# Patient Record
Sex: Female | Born: 1970 | Race: White | Hispanic: No | Marital: Single | Smoking: Never smoker
Health system: Southern US, Community
[De-identification: ages and names within clinical notes are randomized; demographics above are authoritative.]

## PROBLEM LIST (undated history)

## (undated) DIAGNOSIS — E559 Vitamin D deficiency, unspecified: Secondary | ICD-10-CM

## (undated) DIAGNOSIS — D649 Anemia, unspecified: Secondary | ICD-10-CM

## (undated) DIAGNOSIS — I8290 Acute embolism and thrombosis of unspecified vein: Secondary | ICD-10-CM

## (undated) DIAGNOSIS — K651 Peritoneal abscess: Secondary | ICD-10-CM

## (undated) DIAGNOSIS — Z5189 Encounter for other specified aftercare: Secondary | ICD-10-CM

## (undated) HISTORY — PX: WISDOM TOOTH EXTRACTION: SHX21

## (undated) HISTORY — DX: Peritoneal abscess: K65.1

## (undated) HISTORY — DX: Anemia, unspecified: D64.9

---

## 1998-06-06 ENCOUNTER — Other Ambulatory Visit: Admission: RE | Admit: 1998-06-06 | Discharge: 1998-06-06 | Payer: Self-pay | Admitting: *Deleted

## 1998-07-03 ENCOUNTER — Encounter: Admission: RE | Admit: 1998-07-03 | Discharge: 1998-07-03 | Payer: Self-pay | Admitting: Family Medicine

## 1998-07-11 ENCOUNTER — Encounter: Admission: RE | Admit: 1998-07-11 | Discharge: 1998-07-11 | Payer: Self-pay | Admitting: Family Medicine

## 1999-01-01 ENCOUNTER — Inpatient Hospital Stay (HOSPITAL_COMMUNITY): Admission: AD | Admit: 1999-01-01 | Discharge: 1999-01-03 | Payer: Self-pay | Admitting: *Deleted

## 1999-02-23 ENCOUNTER — Other Ambulatory Visit: Admission: RE | Admit: 1999-02-23 | Discharge: 1999-02-23 | Payer: Self-pay | Admitting: *Deleted

## 1999-05-08 ENCOUNTER — Encounter: Admission: RE | Admit: 1999-05-08 | Discharge: 1999-05-08 | Payer: Self-pay | Admitting: Family Medicine

## 1999-05-29 ENCOUNTER — Encounter: Admission: RE | Admit: 1999-05-29 | Discharge: 1999-05-29 | Payer: Self-pay | Admitting: Family Medicine

## 1999-06-23 ENCOUNTER — Encounter: Admission: RE | Admit: 1999-06-23 | Discharge: 1999-06-23 | Payer: Self-pay | Admitting: Family Medicine

## 2000-03-29 ENCOUNTER — Emergency Department (HOSPITAL_COMMUNITY): Admission: EM | Admit: 2000-03-29 | Discharge: 2000-03-29 | Payer: Self-pay

## 2000-04-26 ENCOUNTER — Emergency Department (HOSPITAL_COMMUNITY): Admission: EM | Admit: 2000-04-26 | Discharge: 2000-04-26 | Payer: Self-pay | Admitting: Emergency Medicine

## 2000-04-26 ENCOUNTER — Encounter: Payer: Self-pay | Admitting: Emergency Medicine

## 2000-12-16 ENCOUNTER — Other Ambulatory Visit: Admission: RE | Admit: 2000-12-16 | Discharge: 2000-12-16 | Payer: Self-pay | Admitting: *Deleted

## 2001-12-29 ENCOUNTER — Other Ambulatory Visit: Admission: RE | Admit: 2001-12-29 | Discharge: 2001-12-29 | Payer: Self-pay | Admitting: *Deleted

## 2002-11-20 ENCOUNTER — Other Ambulatory Visit: Admission: RE | Admit: 2002-11-20 | Discharge: 2002-11-20 | Payer: Self-pay | Admitting: *Deleted

## 2003-03-04 ENCOUNTER — Inpatient Hospital Stay (HOSPITAL_COMMUNITY): Admission: AD | Admit: 2003-03-04 | Discharge: 2003-03-04 | Payer: Self-pay | Admitting: *Deleted

## 2003-05-27 ENCOUNTER — Encounter: Payer: Self-pay | Admitting: Obstetrics and Gynecology

## 2003-05-27 ENCOUNTER — Inpatient Hospital Stay (HOSPITAL_COMMUNITY): Admission: AD | Admit: 2003-05-27 | Discharge: 2003-05-27 | Payer: Self-pay | Admitting: Obstetrics and Gynecology

## 2003-06-04 ENCOUNTER — Inpatient Hospital Stay (HOSPITAL_COMMUNITY): Admission: AD | Admit: 2003-06-04 | Discharge: 2003-06-07 | Payer: Self-pay | Admitting: Obstetrics and Gynecology

## 2003-07-12 ENCOUNTER — Other Ambulatory Visit: Admission: RE | Admit: 2003-07-12 | Discharge: 2003-07-12 | Payer: Self-pay | Admitting: *Deleted

## 2004-01-09 ENCOUNTER — Other Ambulatory Visit: Admission: RE | Admit: 2004-01-09 | Discharge: 2004-01-09 | Payer: Self-pay | Admitting: *Deleted

## 2004-12-31 ENCOUNTER — Other Ambulatory Visit: Admission: RE | Admit: 2004-12-31 | Discharge: 2004-12-31 | Payer: Self-pay | Admitting: Obstetrics and Gynecology

## 2006-01-14 ENCOUNTER — Other Ambulatory Visit: Admission: RE | Admit: 2006-01-14 | Discharge: 2006-01-14 | Payer: Self-pay | Admitting: Obstetrics and Gynecology

## 2007-03-02 ENCOUNTER — Encounter: Payer: Self-pay | Admitting: Vascular Surgery

## 2007-03-02 ENCOUNTER — Ambulatory Visit (HOSPITAL_COMMUNITY): Admission: RE | Admit: 2007-03-02 | Discharge: 2007-03-02 | Payer: Self-pay | Admitting: Orthopaedic Surgery

## 2007-03-02 ENCOUNTER — Ambulatory Visit: Payer: Self-pay | Admitting: Vascular Surgery

## 2007-06-29 ENCOUNTER — Encounter: Admission: RE | Admit: 2007-06-29 | Discharge: 2007-06-29 | Payer: Self-pay | Admitting: Obstetrics and Gynecology

## 2007-10-11 ENCOUNTER — Encounter: Admission: RE | Admit: 2007-10-11 | Discharge: 2007-10-11 | Payer: Self-pay | Admitting: Internal Medicine

## 2010-12-17 ENCOUNTER — Other Ambulatory Visit: Payer: Self-pay | Admitting: Obstetrics and Gynecology

## 2010-12-17 DIAGNOSIS — N6310 Unspecified lump in the right breast, unspecified quadrant: Secondary | ICD-10-CM

## 2010-12-22 ENCOUNTER — Ambulatory Visit
Admission: RE | Admit: 2010-12-22 | Discharge: 2010-12-22 | Disposition: A | Discharge status: Home / Self Care | Payer: BC Managed Care – PPO | Source: Ambulatory Visit | Attending: Obstetrics and Gynecology | Admitting: Obstetrics and Gynecology

## 2010-12-22 DIAGNOSIS — N6310 Unspecified lump in the right breast, unspecified quadrant: Secondary | ICD-10-CM

## 2011-03-19 NOTE — H&P (Signed)
   NAMELENAE, WHERLEY                      ACCOUNT NO.:  [id number]   MEDICAL RECORD NO.:  [id number]                   PATIENT TYPE:  INP   LOCATION:  9198                                 FACILITY:  [facility]   PHYSICIAN:  Tracie Harrier, M.D.              DATE OF BIRTH:  03/25/1971   DATE OF ADMISSION:  05/31/2003  DATE OF DISCHARGE:                                HISTORY & PHYSICAL   HISTORY OF PRESENT ILLNESS:  Ms. Kimberly Stuart is a 40 year old female gravida 4  para 2 A1 at 57 and two-sevenths weeks gestation.  The patient is admitted  for primary cesarean section due to breech presentation.  Her pregnancy has  been uncomplicated except for a somewhat unstable presentation late in  pregnancy.  She has had two previous vaginal deliveries.   Otherwise, her pregnancy has gone pretty well.   OBSTETRICAL LABORATORY DATA:  Maternal blood type is A positive.  Rubella  immune.  Group B strep negative.  Glucola 106.   MEDICAL HISTORY:  History of cervical dysplasia.   SURGICAL HISTORY:  1. Cryotherapy of cervix in 1991.  2. Left hand surgery in 1997.  3. In 1988 D&C.   OBSTETRICAL HISTORY:  1. In 1988 SAB with D&E.  2. In 1994, normal spontaneous vaginal delivery at term.  3. In 2000, normal spontaneous vaginal delivery at term.   CURRENT MEDICATIONS:  Prenatal vitamins.   ALLERGIES:  No known drug allergies.   PHYSICAL EXAMINATION:  VITAL SIGNS:  Stable, blood pressure 110/70, fetal  heart tones 140s.  GENERAL:  She is a well-developed, well-nourished female in no acute  distress.  HEENT:  Within normal limits.  NECK:  Supple without adenopathy or thyromegaly.  HEART:  Regular rate and rhythm without murmur, gallop, or rub.  LUNGS:  Clear to auscultation.  BREAST:  Deferred.  ABDOMEN:  Gravid and nontender.  EXTREMITIES AND NEUROLOGIC:  Grossly normal.  PELVIC:  Deferred.   Ultrasound reveals breech presentation.   ADMITTING DIAGNOSES:  1. Intrauterine pregnancy at  term.  2. Breech presentation.   PLAN:  Primary cesarean section.   DISCUSSION:  The risks and benefits of this surgery explained to the  patient.                                               Tracie Harrier, M.D.    REG/MEDQ  D:  05/31/2003  T:  05/31/2003  Job:  [id number]

## 2016-07-31 ENCOUNTER — Encounter (HOSPITAL_COMMUNITY): Payer: Self-pay | Admitting: Emergency Medicine

## 2016-07-31 ENCOUNTER — Emergency Department (HOSPITAL_COMMUNITY): Payer: BLUE CROSS/BLUE SHIELD

## 2016-07-31 ENCOUNTER — Emergency Department (HOSPITAL_COMMUNITY)
Admission: EM | Admit: 2016-07-31 | Discharge: 2016-08-01 | Disposition: A | Discharge status: Home / Self Care | Payer: BLUE CROSS/BLUE SHIELD | Attending: Emergency Medicine | Admitting: Emergency Medicine

## 2016-07-31 DIAGNOSIS — R1030 Lower abdominal pain, unspecified: Secondary | ICD-10-CM | POA: Diagnosis present

## 2016-07-31 DIAGNOSIS — N946 Dysmenorrhea, unspecified: Secondary | ICD-10-CM | POA: Diagnosis not present

## 2016-07-31 LAB — COMPREHENSIVE METABOLIC PANEL
ALK PHOS: 52 U/L (ref 38–126)
ALT: 17 U/L (ref 14–54)
AST: 23 U/L (ref 15–41)
Albumin: 4.2 g/dL (ref 3.5–5.0)
Anion gap: 10 (ref 5–15)
BILIRUBIN TOTAL: 0.9 mg/dL (ref 0.3–1.2)
BUN: 12 mg/dL (ref 6–20)
CALCIUM: 9.5 mg/dL (ref 8.9–10.3)
CHLORIDE: 106 mmol/L (ref 101–111)
CO2: 21 mmol/L — ABNORMAL LOW (ref 22–32)
CREATININE: 1.1 mg/dL — AB (ref 0.44–1.00)
GFR calc Af Amer: >60 mL/min (ref >60)
Glucose, Bld: 119 mg/dL — ABNORMAL HIGH (ref 65–99)
Potassium: 3.4 mmol/L — ABNORMAL LOW (ref 3.5–5.1)
Sodium: 137 mmol/L (ref 135–145)
Total Protein: 7.1 g/dL (ref 6.5–8.1)

## 2016-07-31 LAB — URINE MICROSCOPIC-ADD ON
Bacteria, UA: NONE SEEN
WBC, UA: NONE SEEN WBC/hpf (ref 0–5)

## 2016-07-31 LAB — WET PREP, GENITAL
Clue Cells Wet Prep HPF POC: NONE SEEN
SPERM: NONE SEEN
TRICH WET PREP: NONE SEEN
YEAST WET PREP: NONE SEEN

## 2016-07-31 LAB — CBC
HCT: 42.2 % (ref 36.0–46.0)
Hemoglobin: 14.3 g/dL (ref 12.0–15.0)
MCH: 31.2 pg (ref 26.0–34.0)
MCHC: 33.9 g/dL (ref 30.0–36.0)
MCV: 92.1 fL (ref 78.0–100.0)
PLATELETS: 240 10*3/uL (ref 150–400)
RBC: 4.58 MIL/uL (ref 3.87–5.11)
RDW: 12.4 % (ref 11.5–15.5)
WBC: 13.1 10*3/uL — AB (ref 4.0–10.5)

## 2016-07-31 LAB — URINALYSIS, ROUTINE W REFLEX MICROSCOPIC
BILIRUBIN URINE: NEGATIVE
Glucose, UA: NEGATIVE mg/dL
KETONES UR: NEGATIVE mg/dL
Leukocytes, UA: NEGATIVE
Nitrite: NEGATIVE
Protein, ur: NEGATIVE mg/dL
SPECIFIC GRAVITY, URINE: 1.036 — AB (ref 1.005–1.030)
pH: 7 (ref 5.0–8.0)

## 2016-07-31 LAB — I-STAT BETA HCG BLOOD, ED (MC, WL, AP ONLY): I-stat hCG, quantitative: <5 m[IU]/mL (ref <5)

## 2016-07-31 LAB — LIPASE, BLOOD: LIPASE: 33 U/L (ref 11–51)

## 2016-07-31 MED ORDER — ONDANSETRON 4 MG PO TBDP
4.0000 mg | ORAL_TABLET | Freq: Once | ORAL | Status: AC | PRN
  Administered 2016-07-31: 4 mg via ORAL

## 2016-07-31 MED ORDER — IBUPROFEN 400 MG PO TABS
800.0000 mg | ORAL_TABLET | Freq: Once | ORAL | Status: AC | PRN
  Administered 2016-07-31: 800 mg via ORAL

## 2016-07-31 MED ORDER — IOPAMIDOL (ISOVUE-300) INJECTION 61%
INTRAVENOUS | Status: AC
  Administered 2016-07-31: 100 mL
  Filled 2016-07-31: qty 100

## 2016-07-31 MED ORDER — IBUPROFEN 400 MG PO TABS
ORAL_TABLET | ORAL | Status: AC
  Filled 2016-07-31: qty 2

## 2016-07-31 MED ORDER — SODIUM CHLORIDE 0.9 % IV BOLUS (SEPSIS)
1000.0000 mL | Freq: Once | INTRAVENOUS | Status: AC
  Administered 2016-07-31: 1000 mL via INTRAVENOUS

## 2016-07-31 MED ORDER — ONDANSETRON 4 MG PO TBDP
ORAL_TABLET | ORAL | Status: AC
  Filled 2016-07-31: qty 1

## 2016-07-31 NOTE — ED Provider Notes (Signed)
[facility]-EMERGENCY DEPT Provider Note   CSN: [id number] Arrival date & time: 07/31/16  1738     History   Chief Complaint Chief Complaint  Patient presents with  . Abdominal Pain    HPI Kimberly Stuart is a 45 y.o. female.  1 day of increased severe menstrual cramping.  She states she started her menstrual cycle yesterday with normal cramping.  Today woke up with bilateral lower abdominal pain and severe cramping with less bleeding than normal.  She states she was worked up for abnormal menstrual bleeding several months ago with normal findings.  Patient states she normally can take ibuprofen with relief.  She tried this yesterday and today with no relief.  Denies any vaginal discharge possibility of pregnancy, dysuria or frequency.  She does state she has a history of constipation, but normally has a loose stool with the onset of her menses, which happened yesterday.  Denies any fever, recent antibiotic use      History reviewed. No pertinent past medical history.  There are no active problems to display for this patient.   History reviewed. No pertinent surgical history.  OB History    No data available       Home Medications    Prior to Admission medications   Medication Sig Start Date End Date Taking? Authorizing Provider  ibuprofen (ADVIL,MOTRIN) 200 MG tablet Take 200-600 mg by mouth every 6 (six) hours as needed for moderate pain.   Yes Historical Provider, MD    Family History History reviewed. No pertinent family history.  Social History Social History  Substance Use Topics  . Smoking status: Never Smoker  . Smokeless tobacco: Never Used  . Alcohol use No     Allergies   Review of patient's allergies indicates no known allergies.   Review of Systems Review of Systems  Constitutional: Negative for chills and fever.  Respiratory: Negative for shortness of breath.   Cardiovascular: Negative for chest pain.  Gastrointestinal: Positive for abdominal  pain.  Genitourinary: Positive for menstrual problem and vaginal bleeding. Negative for dysuria, frequency and vaginal discharge.  All other systems reviewed and are negative.    Physical Exam Updated Vital Signs BP 112/74   Pulse 69   Temp 98.4 F (36.9 C) (Oral)   Resp 16   LMP 07/30/2016 (Exact Date)   SpO2 99%   Physical Exam  Constitutional: She is oriented to person, place, and time. She appears well-developed and well-nourished.  HENT:  Head: Normocephalic.  Eyes: Pupils are equal, round, and reactive to light.  Neck: Normal range of motion.  Cardiovascular: Normal rate.   Pulmonary/Chest: Effort normal.  Abdominal: Soft.  Neurological: She is alert and oriented to person, place, and time.  Psychiatric: She has a normal mood and affect.  Nursing note and vitals reviewed.    ED Treatments / Results  Labs (all labs ordered are listed, but only abnormal results are displayed) Labs Reviewed  WET PREP, GENITAL - Abnormal; Notable for the following:       Result Value   WBC, Wet Prep HPF POC MANY (*)    All other components within normal limits  COMPREHENSIVE METABOLIC PANEL - Abnormal; Notable for the following:    Potassium 3.4 (*)    CO2 21 (*)    Glucose, Bld 119 (*)    Creatinine, Ser 1.10 (*)    All other components within normal limits  URINALYSIS, ROUTINE W REFLEX MICROSCOPIC (NOT AT [facility]) - Abnormal; Notable for the following:  Specific Gravity, Urine 1.036 (*)    Hgb urine dipstick LARGE (*)    All other components within normal limits  CBC - Abnormal; Notable for the following:    WBC 13.1 (*)    All other components within normal limits  URINE MICROSCOPIC-ADD ON - Abnormal; Notable for the following:    Squamous Epithelial / LPF 6-30 (*)    All other components within normal limits  LIPASE, BLOOD  I-STAT BETA HCG BLOOD, ED ([facility], [facility], [facility] ONLY)    EKG  EKG Interpretation None       Radiology Ct Abdomen Pelvis W Contrast  Result Date:  08/01/2016 CLINICAL DATA:  Abdominal pain. EXAM: CT ABDOMEN AND PELVIS WITH CONTRAST TECHNIQUE: Multidetector CT imaging of the abdomen and pelvis was performed using the standard protocol following bolus administration of intravenous contrast. CONTRAST:  100 mL Isovue-300 COMPARISON:  None. FINDINGS: Lower chest: Mild dependent changes in the lung bases. Hepatobiliary: 14 mm low-attenuation lesion in the dome of the liver likely representing a small cyst. No other focal liver lesions identified. Gallbladder and bile ducts are unremarkable. Pancreas: Unremarkable. No pancreatic ductal dilatation or surrounding inflammatory changes. Spleen: Normal in size without focal abnormality. Adrenals/Urinary Tract: No adrenal gland nodules. Renal nephrograms are symmetrical. No hydronephrosis or hydroureter. Bladder wall is not thickened. Stomach/Bowel: Stomach, small bowel, and colon are mostly decompressed. Scattered stool in the colon. Scattered colonic diverticula. No inflammatory changes. Vascular/Lymphatic: Normal caliber thoracic aorta. Scattered lymph nodes in the retroperitoneum are not pathologically enlarged. Prominent lymph nodes demonstrated in the pelvis, greater on the left but without pathologic enlargement, likely reactive. Reproductive: Uterus is anteverted. Endometrial stripe thickness in the fundus appears normal. Endometrial stripe in the lower uterine segment and endocervical canal appears increased. This may be physiologic. No abnormal adnexal masses. Other: No abdominal wall hernia or abnormality. No abdominopelvic ascites. Musculoskeletal: No acute or significant osseous findings. IMPRESSION: No evidence of bowel obstruction or inflammation. Probable cyst in the dome of the liver. Expanded endometrium in the lower uterine segment and endocervical canal, possibly physiologic. Correlation with menstrual stage is recommended. No abnormal adnexal masses. Electronically Signed   By: Burman Nieves M.D.    On: 08/01/2016 00:19   Dg Abd Acute W/chest  Result Date: 07/31/2016 CLINICAL DATA:  Acute onset of lower abdominal pain. Nausea and dizziness. Initial encounter. EXAM: DG ABDOMEN ACUTE W/ 1V CHEST COMPARISON:  None. FINDINGS: The lungs are well-aerated and clear. There is no evidence of focal opacification, pleural effusion or pneumothorax. The cardiomediastinal silhouette is within normal limits. The visualized bowel gas pattern is unremarkable. Scattered stool and air are seen within the colon; there is no evidence of small bowel dilatation to suggest obstruction. No free intra-abdominal air is identified on the provided upright view. No acute osseous abnormalities are seen; the sacroiliac joints are unremarkable in appearance. IMPRESSION: 1. Unremarkable bowel gas pattern; no free intra-abdominal air seen. Small amount of stool noted in the colon. 2. No acute cardiopulmonary process seen. Electronically Signed   By: Roanna Raider M.D.   On: 07/31/2016 22:32    Procedures Procedures (including critical care time)  Medications Ordered in ED Medications  iopamidol (ISOVUE-300) 61 % injection (not administered)  ondansetron (ZOFRAN-ODT) disintegrating tablet 4 mg (4 mg Oral Given 07/31/16 1755)  ibuprofen (ADVIL,MOTRIN) tablet 800 mg (800 mg Oral Given 07/31/16 1803)  sodium chloride 0.9 % bolus 1,000 mL (0 mLs Intravenous Stopped 07/31/16 2219)     Initial Impression / Assessment and  Plan / ED Course  I have reviewed the triage vital signs and the nursing notes.  Pertinent labs & imaging results that were available during my care of the patient were reviewed by me and considered in my medical decision making (see chart for details).  Clinical Course     CT scan pelvic exam, labs, wet prep within normal parameters except for slightly elevated white count of 13.1, without any overt indication of infection.  Patient is currently on day 2 of her menstrual cycle.  She has refused narcotic pain  medication, stating that she doesn't want to become nauseated from the medicine.  She would rather deal with the discomfort and take nonsteroidals on a regular basis.  She does have an OB/GYN that she can follow-up with  Final Clinical Impressions(s) / ED Diagnoses   Final diagnoses:  Dysmenorrhea    New Prescriptions New Prescriptions   No medications on file     Earley FavorGail Ima Hafner, NP 08/01/16 0030    Canary Brimhristopher J Tegeler, MD 08/01/16 [id number]

## 2016-07-31 NOTE — ED Notes (Signed)
Attempted IV, unsuccessful. Second RN will try.

## 2016-07-31 NOTE — ED Triage Notes (Signed)
Menses onset yesterday; having cramps today worse than when she was in labor with children. Bleeding is not heavier than normal, but the cramps are nonstop. Taking Motrin all day without relief.

## 2016-07-31 NOTE — ED Notes (Signed)
Refuses Percocet in triage protocol orders. Doubled over in pain. Last Motrin was at 12:00. So far today has taken a total of 5 (200mg ) tablets in last 24 hours. Does want more motrin at this time.

## 2016-08-01 NOTE — Discharge Instructions (Signed)
You were evaluated for abdominal pain, your CT scan is normal.  Urine is normal.  Pelvic exam was within normal parameters not indicating any infection.  Please continue take ibuprofen on a regular basis.  Follow-up with your OB/GYN

## 2016-08-01 NOTE — ED Notes (Signed)
Patient Alert and oriented X4. Stable and ambulatory. Patient verbalized understanding of the discharge instructions.  Patient belongings were taken by the patient.  

## 2017-11-01 DIAGNOSIS — Z5189 Encounter for other specified aftercare: Secondary | ICD-10-CM

## 2017-11-01 DIAGNOSIS — I8290 Acute embolism and thrombosis of unspecified vein: Secondary | ICD-10-CM

## 2017-11-01 DIAGNOSIS — I82409 Acute embolism and thrombosis of unspecified deep veins of unspecified lower extremity: Secondary | ICD-10-CM

## 2017-11-01 HISTORY — DX: Encounter for other specified aftercare: Z51.89

## 2017-11-01 HISTORY — DX: Acute embolism and thrombosis of unspecified deep veins of unspecified lower extremity: I82.409

## 2017-11-01 HISTORY — DX: Acute embolism and thrombosis of unspecified vein: I82.90

## 2018-05-29 ENCOUNTER — Inpatient Hospital Stay (HOSPITAL_COMMUNITY)
Admission: AD | Admit: 2018-05-29 | Discharge: 2018-05-29 | Disposition: A | Discharge status: Home / Self Care | Payer: Self-pay | Source: Ambulatory Visit | Attending: Obstetrics and Gynecology | Admitting: Obstetrics and Gynecology

## 2018-05-29 ENCOUNTER — Encounter (HOSPITAL_COMMUNITY): Payer: Self-pay

## 2018-05-29 ENCOUNTER — Other Ambulatory Visit: Payer: Self-pay

## 2018-05-29 DIAGNOSIS — N939 Abnormal uterine and vaginal bleeding, unspecified: Secondary | ICD-10-CM | POA: Insufficient documentation

## 2018-05-29 DIAGNOSIS — E559 Vitamin D deficiency, unspecified: Secondary | ICD-10-CM | POA: Insufficient documentation

## 2018-05-29 DIAGNOSIS — Z79899 Other long term (current) drug therapy: Secondary | ICD-10-CM | POA: Insufficient documentation

## 2018-05-29 DIAGNOSIS — N921 Excessive and frequent menstruation with irregular cycle: Secondary | ICD-10-CM

## 2018-05-29 HISTORY — DX: Anemia, unspecified: D64.9

## 2018-05-29 HISTORY — DX: Vitamin D deficiency, unspecified: E55.9

## 2018-05-29 LAB — POCT PREGNANCY, URINE: Preg Test, Ur: NEGATIVE

## 2018-05-29 LAB — URINALYSIS, ROUTINE W REFLEX MICROSCOPIC

## 2018-05-29 LAB — URINALYSIS, MICROSCOPIC (REFLEX)

## 2018-05-29 LAB — CBC
HEMATOCRIT: 30.6 % — AB (ref 36.0–46.0)
HEMOGLOBIN: 10.4 g/dL — AB (ref 12.0–15.0)
MCH: 28.8 pg (ref 26.0–34.0)
MCHC: 34 g/dL (ref 30.0–36.0)
MCV: 84.8 fL (ref 78.0–100.0)
Platelets: 244 10*3/uL (ref 150–400)
RBC: 3.61 MIL/uL — ABNORMAL LOW (ref 3.87–5.11)
RDW: 13.7 % (ref 11.5–15.5)
WBC: 6.9 10*3/uL (ref 4.0–10.5)

## 2018-05-29 MED ORDER — MEGESTROL ACETATE 20 MG PO TABS: 60.0000 mg | ORAL_TABLET | Freq: Two times a day (BID) | ORAL | 0 refills | Status: DC

## 2018-05-29 MED ORDER — THERA VITAL M PO TABS: 1.0000 | ORAL_TABLET | Freq: Every day | ORAL | Status: DC

## 2018-05-29 MED ORDER — NORGESTIMATE-ETH ESTRADIOL 0.25-35 MG-MCG PO TABS: 1.0000 | ORAL_TABLET | Freq: Every day | ORAL | 11 refills | Status: DC

## 2018-05-29 NOTE — Discharge Instructions (Signed)

## 2018-05-29 NOTE — MAU Provider Note (Signed)
History     CSN: [id number]  Arrival date and time: 05/29/18 1644   First Provider Initiated Contact with Patient 05/29/18 1810      Chief Complaint  Patient presents with  . Vaginal Bleeding   HPI  Kimberly Stuart is a 47 y.o. [phone] non pregnant patient who presents to MAU with chief complaint of light to moderate vaginal bleeding between periods. Patient states for the past year she has had "some sort of period" every two weeks but has been bleeding continuously for the past four weeks. Denies pain, fever, falls, headaches and lightheadedness.  Not sexually active.  Pertinent Gynecological History: Menses: regular every 14 days without intermenstrual spotting Bleeding: intermenstrual bleeding Contraception: abstinence DES exposure: denies Blood transfusions: none Sexually transmitted diseases: no past history Previous GYN Procedures: None  Last mammogram: normal Date: 2017 per patient Last pap: normal Date: 2017 per patient   Past Medical History:  Diagnosis Date  . Anemia   . Vitamin D deficiency     Past Surgical History:  Procedure Laterality Date  . WISDOM TOOTH EXTRACTION      No family history on file.  Social History   Tobacco Use  . Smoking status: Never Smoker  . Smokeless tobacco: Never Used  Substance Use Topics  . Alcohol use: No  . Drug use: No    Allergies: No Known Allergies  Medications Prior to Admission  Medication Sig Dispense Refill Last Dose  . ibuprofen (ADVIL,MOTRIN) 200 MG tablet Take 200-600 mg by mouth every 6 (six) hours as needed for moderate pain.   07/31/2016 at Unknown time    Review of Systems  Constitutional: Negative for fatigue and fever.  Gastrointestinal: Negative for abdominal distention, abdominal pain, nausea and vomiting.  Genitourinary: Positive for vaginal bleeding. Negative for vaginal pain.  Neurological: Negative for dizziness, light-headedness and headaches.  All other systems reviewed and are  negative.  Physical Exam   Blood pressure 119/69, pulse 78, temperature 98 F (36.7 C), temperature source Oral, resp. rate 17, weight 180 lb 4 oz (81.8 kg), last menstrual period 05/01/2018, SpO2 98 %.  Physical Exam  Nursing note and vitals reviewed. Constitutional: She is oriented to person, place, and time. She appears well-developed and well-nourished.  Cardiovascular: Normal rate, regular rhythm, normal heart sounds and intact distal pulses.  Respiratory: Effort normal and breath sounds normal.  GI: Soft. Bowel sounds are normal.  Genitourinary: Uterus normal. Vaginal discharge found.  Genitourinary Comments: Moderate dark red sanguinous vaginal discharge visible on SSE  Neurological: She is alert and oriented to person, place, and time. She has normal reflexes.  Skin: Skin is warm and dry.  Psychiatric: She has a normal mood and affect. Her behavior is normal. Judgment and thought content normal.    MAU Course  Procedures  MDM Patient Vitals for the past 24 hrs:  BP Temp Temp src Pulse Resp SpO2 Weight  05/29/18 1840 - - - - 16 - -  05/29/18 1709 119/69 98 F (36.7 C) Oral 78 17 98 % 180 lb 4 oz (81.8 kg)    Orders Placed This Encounter  Procedures  . CBC  . Urinalysis, Routine w reflex microscopic  . Urinalysis, Microscopic (reflex)  . Pregnancy, urine POC  . Discharge patient   Results for orders placed or performed during the hospital encounter of 05/29/18 (from the past 24 hour(s))  CBC     Status: Abnormal   Collection Time: 05/29/18  5:18 PM  Result Value Ref Range  WBC 6.9 4.0 - 10.5 K/uL   RBC 3.61 (L) 3.87 - 5.11 MIL/uL   Hemoglobin 10.4 (L) 12.0 - 15.0 g/dL   HCT 09.8 (L) 11.9 - 14.7 %   MCV 84.8 78.0 - 100.0 fL   MCH 28.8 26.0 - 34.0 pg   MCHC 34.0 30.0 - 36.0 g/dL   RDW 82.9 56.2 - 13.0 %   Platelets 244 150 - 400 K/uL  Urinalysis, Routine w reflex microscopic     Status: Abnormal   Collection Time: 05/29/18  6:07 PM  Result Value Ref Range    Color, Urine RED (A) YELLOW   APPearance TURBID (A) CLEAR   Specific Gravity, Urine  1.005 - 1.030    TEST NOT REPORTED DUE TO COLOR INTERFERENCE OF URINE PIGMENT   pH  5.0 - 8.0    TEST NOT REPORTED DUE TO COLOR INTERFERENCE OF URINE PIGMENT   Glucose, UA (A) NEGATIVE mg/dL    TEST NOT REPORTED DUE TO COLOR INTERFERENCE OF URINE PIGMENT   Hgb urine dipstick (A) NEGATIVE    TEST NOT REPORTED DUE TO COLOR INTERFERENCE OF URINE PIGMENT   Bilirubin Urine (A) NEGATIVE    TEST NOT REPORTED DUE TO COLOR INTERFERENCE OF URINE PIGMENT   Ketones, ur (A) NEGATIVE mg/dL    TEST NOT REPORTED DUE TO COLOR INTERFERENCE OF URINE PIGMENT   Protein, ur (A) NEGATIVE mg/dL    TEST NOT REPORTED DUE TO COLOR INTERFERENCE OF URINE PIGMENT   Nitrite (A) NEGATIVE    TEST NOT REPORTED DUE TO COLOR INTERFERENCE OF URINE PIGMENT   Leukocytes, UA (A) NEGATIVE    TEST NOT REPORTED DUE TO COLOR INTERFERENCE OF URINE PIGMENT  Urinalysis, Microscopic (reflex)     Status: Abnormal   Collection Time: 05/29/18  6:07 PM  Result Value Ref Range   RBC / HPF >50 0 - 5 RBC/hpf   WBC, UA 0-5 0 - 5 WBC/hpf   Bacteria, UA FEW (A) NONE SEEN   Squamous Epithelial / LPF 0-5 0 - 5  Pregnancy, urine POC     Status: None   Collection Time: 05/29/18  6:10 PM  Result Value Ref Range   Preg Test, Ur NEGATIVE NEGATIVE   Meds ordered this encounter  Medications  . megestrol (MEGACE) 20 MG tablet    Sig: Take 3 tablets (60 mg total) by mouth 2 (two) times daily.    Dispense:  180 tablet    Refill:  0    Order Specific Question:   Supervising Provider    Answer:   Reva Bores [[id number]]  . Multiple Vitamins-Minerals (MULTIVITAMIN) tablet    Sig: Take 1 tablet by mouth daily.    Order Specific Question:   Supervising Provider    Answer:   Samara Snide  . norgestimate-ethinyl estradiol (ORTHO-CYCLEN,SPRINTEC,PREVIFEM) 0.25-35 MG-MCG tablet    Sig: Take 1 tablet by mouth daily.    Dispense:  1 Package    Refill:  11     Order Specific Question:   Supervising Provider    Answer:   Reva Bores [[id number]]     Assessment and Plan  --47 y.o. non pregnant patient --abnormal uterine bleeding  --declines outpatient ultrasound --Printed rx for Megace and Sprintec given to patient --Patient to establish care with GYN provider --stable for outpatient followup, discharge home in stable condition  Calvert Cantor, CNM 05/29/2018, 7:13 PM

## 2018-05-29 NOTE — MAU Note (Signed)
Has been bleeding for about 4 wks.  Every 30-1040min, is changing a pad and tampon, completely saturated.  No pain at all.  About 2 yrs ago, bled for about 3 wks, saw her GYN- everything was fine.slowed down and stopped. Last few months- periods have been closer.

## 2018-06-11 ENCOUNTER — Encounter (HOSPITAL_COMMUNITY): Payer: Self-pay | Admitting: Advanced Practice Midwife

## 2018-06-11 ENCOUNTER — Other Ambulatory Visit: Payer: Self-pay

## 2018-06-11 ENCOUNTER — Inpatient Hospital Stay (HOSPITAL_COMMUNITY)
Admission: AD | Admit: 2018-06-11 | Discharge: 2018-06-11 | Disposition: A | Discharge status: Home / Self Care | Payer: Self-pay | Source: Ambulatory Visit | Attending: Obstetrics and Gynecology | Admitting: Obstetrics and Gynecology

## 2018-06-11 ENCOUNTER — Inpatient Hospital Stay (HOSPITAL_BASED_OUTPATIENT_CLINIC_OR_DEPARTMENT_OTHER): Payer: Self-pay

## 2018-06-11 DIAGNOSIS — I82442 Acute embolism and thrombosis of left tibial vein: Secondary | ICD-10-CM | POA: Insufficient documentation

## 2018-06-11 DIAGNOSIS — T385X5A Adverse effect of other estrogens and progestogens, initial encounter: Secondary | ICD-10-CM | POA: Insufficient documentation

## 2018-06-11 DIAGNOSIS — X58XXXA Exposure to other specified factors, initial encounter: Secondary | ICD-10-CM | POA: Insufficient documentation

## 2018-06-11 LAB — COMPREHENSIVE METABOLIC PANEL
ALBUMIN: 3.8 g/dL (ref 3.5–5.0)
ALT: 16 U/L (ref 0–44)
AST: 17 U/L (ref 15–41)
Alkaline Phosphatase: 57 U/L (ref 38–126)
Anion gap: 10 (ref 5–15)
BUN: 10 mg/dL (ref 6–20)
CHLORIDE: 101 mmol/L (ref 98–111)
CO2: 22 mmol/L (ref 22–32)
CREATININE: 0.83 mg/dL (ref 0.44–1.00)
Calcium: 8.4 mg/dL — ABNORMAL LOW (ref 8.9–10.3)
GFR calc Af Amer: >60 mL/min (ref >60)
GFR calc non Af Amer: >60 mL/min (ref >60)
GLUCOSE: 121 mg/dL — AB (ref 70–99)
Potassium: 3.8 mmol/L (ref 3.5–5.1)
SODIUM: 133 mmol/L — AB (ref 135–145)
Total Bilirubin: 0.4 mg/dL (ref 0.3–1.2)
Total Protein: 7.2 g/dL (ref 6.5–8.1)

## 2018-06-11 LAB — URINALYSIS, ROUTINE W REFLEX MICROSCOPIC
Bilirubin Urine: NEGATIVE
GLUCOSE, UA: NEGATIVE mg/dL
Ketones, ur: NEGATIVE mg/dL
Leukocytes, UA: NEGATIVE
NITRITE: NEGATIVE
PH: 5 (ref 5.0–8.0)
Protein, ur: NEGATIVE mg/dL
Specific Gravity, Urine: 1.013 (ref 1.005–1.030)

## 2018-06-11 MED ORDER — RIVAROXABAN (XARELTO) VTE STARTER PACK (15 & 20 MG): 1.0000 | ORAL_TABLET | ORAL | 0 refills | Status: DC

## 2018-06-11 MED ORDER — RIVAROXABAN 15 MG PO TABS
15.0000 mg | ORAL_TABLET | Freq: Once | ORAL | Status: AC
  Administered 2018-06-11: 15 mg via ORAL
  Filled 2018-06-11: qty 1

## 2018-06-11 NOTE — Progress Notes (Addendum)
Nonpregnant here dt left calf pain. Measured both calves and same measurement. No redness noted. Tender when pressing on it, walking, sitting, constant pain "stabbing pain" 8/10.  Also here for vb. Been on bc for 3 weeks. Was prescribed megace but have not started on it.   "Usually taking Ibuprofen helps with any pain". Took two tabls of 200mg  each last night at 2300 but did not help.   1345: DVT doppler study ordered.   1350: [facility]   1355: vascular tech returned call. Report status of pt given.   1615 vascular tech at bs.  1625: resumed care of pt. +DVT informed to me by RN Sunny SchleinFelicia and RN Mardella LaymanLindsey from birthing   [phone]: provider okayed for pt to get up and walk to bathroom  Declines pain meds.   1635: up to bathroom and voided without problems  1640: drinks and crackers provided after approval from the provider CNM Ivonne AndrewV. Smith.   1720: lab at bs  1755: provider on phone with pharmacist discussing med administration or to hold  1759: provider ordered to administer.   1804 med given  1845: d/c instructions given with pt understanding.  [facility] given and [facility] Made aware to call internal med for appt.   1857: Pt left unit via ambulatory.

## 2018-06-11 NOTE — MAU Provider Note (Signed)
Chief Complaint: Vaginal Bleeding and Leg Pain   First Provider Initiated Contact with Patient 06/11/18 1354     SUBJECTIVE HPI: Kimberly Stuart is a 47 y.o. [phone] female who presents to Maternity Admissions reporting Left calf pain x 5 days. Started OCPs for AUB 05/29/18. No Hx DVT, other blood clots or know clotting disorder personally or in family members.   Location: Left inner calf Quality: stabbing Severity: Moderate Duration: 5 days Context: Recently started OCPs. No recent Air travel or prolonged sitting.  Timing: constant Modifying factors: No improvement w/ IBU Associated signs and symptoms: Neg for redness, swelling, SOB, hemoptysis,   Past Medical History:  Diagnosis Date  . Anemia   . Vitamin D deficiency    OB History  Gravida Para Term Preterm AB Living  4 3 3   1 3   SAB TAB Ectopic Multiple Live Births  1       3    # Outcome Date GA Lbr Len/2nd Weight Sex Delivery Anes PTL Lv  4 Term      Vag-Spont   LIV  3 Term      Vag-Spont   LIV  2 Term      Vag-Spont   LIV  1 SAB            Past Surgical History:  Procedure Laterality Date  . WISDOM TOOTH EXTRACTION     Social History   Socioeconomic History  . Marital status: Single    Spouse name: Not on file  . Number of children: Not on file  . Years of education: Not on file  . Highest education level: Not on file  Occupational History  . Not on file  Social Needs  . Financial resource strain: Not on file  . Food insecurity:    Worry: Not on file    Inability: Not on file  . Transportation needs:    Medical: Not on file    Non-medical: Not on file  Tobacco Use  . Smoking status: Never Smoker  . Smokeless tobacco: Never Used  Substance and Sexual Activity  . Alcohol use: No  . Drug use: No  . Sexual activity: Not Currently    Birth control/protection: Abstinence  Lifestyle  . Physical activity:    Days per week: Not on file    Minutes per session: Not on file  . Stress: Not on file   Relationships  . Social connections:    Talks on phone: Not on file    Gets together: Not on file    Attends religious service: Not on file    Active member of club or organization: Not on file    Attends meetings of clubs or organizations: Not on file    Relationship status: Not on file  . Intimate partner violence:    Fear of current or ex partner: Not on file    Emotionally abused: Not on file    Physically abused: Not on file    Forced sexual activity: Not on file  Other Topics Concern  . Not on file  Social History Narrative  . Not on file   History reviewed. No pertinent family history. No current facility-administered medications on file prior to encounter.    Current Outpatient Medications on File Prior to Encounter  Medication Sig Dispense Refill  . ibuprofen (ADVIL,MOTRIN) 200 MG tablet Take 200-600 mg by mouth every 6 (six) hours as needed for moderate pain.    . megestrol (MEGACE) 20 MG tablet Take  3 tablets (60 mg total) by mouth 2 (two) times daily. 180 tablet 0  . Multiple Vitamins-Minerals (MULTIVITAMIN) tablet Take 1 tablet by mouth daily.    . norgestimate-ethinyl estradiol (ORTHO-CYCLEN,SPRINTEC,PREVIFEM) 0.25-35 MG-MCG tablet Take 1 tablet by mouth daily. 1 Package 11   No Known Allergies  I have reviewed patient's Past Medical Hx, Surgical Hx, Family Hx, Social Hx, medications and allergies.   Review of Systems  Constitutional: Negative for chills and fever.  Respiratory: Negative for chest tightness and shortness of breath.   Cardiovascular: Negative for chest pain and leg swelling.  Genitourinary: Positive for vaginal bleeding (scant).  Musculoskeletal:       Left calf pain  Skin: Negative for color change, pallor and rash.    OBJECTIVE Patient Vitals for the past 24 hrs:  BP Temp Temp src Pulse Resp SpO2 Height Weight  06/11/18 1804 117/79 - - 78 - - - -  06/11/18 1640 125/69 - - 72 18 - - -  06/11/18 1305 (!) 111/57 98.2 F (36.8 C) Oral 78  18 100 % 5' 1.5" (1.562 m) 80.9 kg   Constitutional: Well-developed, well-nourished female in no acute distress.  Cardiovascular: normal rate Respiratory: normal rate and effort.  GI: Deferred MS: Left inner calf TTP 4 inches above ankle bone. No erythema, warmth, swelling or palpable cords. Slight tightness noted. Pos Homan's. Normal pedal pulses, ROM and skin color. Neurologic: Alert and oriented x 4.  GU: Deferred  LAB RESULTS Results for orders placed or performed during the hospital encounter of 06/11/18 (from the past 24 hour(s))  Urinalysis, Routine w reflex microscopic     Status: Abnormal   Collection Time: 06/11/18  1:18 PM  Result Value Ref Range   Color, Urine YELLOW YELLOW   APPearance CLEAR CLEAR   Specific Gravity, Urine 1.013 1.005 - 1.030   pH 5.0 5.0 - 8.0   Glucose, UA NEGATIVE NEGATIVE mg/dL   Hgb urine dipstick LARGE (A) NEGATIVE   Bilirubin Urine NEGATIVE NEGATIVE   Ketones, ur NEGATIVE NEGATIVE mg/dL   Protein, ur NEGATIVE NEGATIVE mg/dL   Nitrite NEGATIVE NEGATIVE   Leukocytes, UA NEGATIVE NEGATIVE   RBC / HPF 21-50 0 - 5 RBC/hpf   WBC, UA 0-5 0 - 5 WBC/hpf   Bacteria, UA RARE (A) NONE SEEN   Squamous Epithelial / LPF 0-5 0 - 5   Mucus PRESENT   Comprehensive metabolic panel     Status: Abnormal   Collection Time: 06/11/18  5:26 PM  Result Value Ref Range   Sodium 133 (L) 135 - 145 mmol/L   Potassium 3.8 3.5 - 5.1 mmol/L   Chloride 101 98 - 111 mmol/L   CO2 22 22 - 32 mmol/L   Glucose, Bld 121 (H) 70 - 99 mg/dL   BUN 10 6 - 20 mg/dL   Creatinine, Ser 1.610.83 0.44 - 1.00 mg/dL   Calcium 8.4 (L) 8.9 - 10.3 mg/dL   Total Protein 7.2 6.5 - 8.1 g/dL   Albumin 3.8 3.5 - 5.0 g/dL   AST 17 15 - 41 U/L   ALT 16 0 - 44 U/L   Alkaline Phosphatase 57 38 - 126 U/L   Total Bilirubin 0.4 0.3 - 1.2 mg/dL   GFR calc non Af Amer >60 >60 mL/min   GFR calc Af Amer >60 >60 mL/min   Anion gap 10 5 - 15    IMAGING VASCULAR LAB PRELIMINARY  PRELIMINARY  PRELIMINARY   PRELIMINARY  Left lower extremity venous duplex  completed.    Preliminary report:  There is acute DVT noted in the left posterior tibial vein.    KANADY, CANDACE, RVT 06/11/2018, 4:17 PM   MAU COURSE Orders Placed This Encounter  Procedures  . Urinalysis, Routine w reflex microscopic  . Comprehensive metabolic panel  . Discharge patient   Meds ordered this encounter  Medications  . Rivaroxaban (XARELTO) tablet 15 mg  . Rivaroxaban 15 & 20 MG TBPK    Sig: Take 1 tablet by mouth as directed. Take as directed on package: Start with one 15mg  tablet by mouth twice a day with food. On Day 22, switch to one 20mg  tablet once a day with food.    Dispense:  51 each    Refill:  0    Order Specific Question:   Supervising Provider    Answer:   Tilda Burrow [[id number]]   Discussed Hx, labs, exam w/ ED MD. No indication for transfer or IP Tx. New orders: Xarelto starter pack. First dose given in MAU. Also consulted w/ Dr. Emelda Fear who agrees w/ POC.   Discussed pt not having insurance w/ Pharmacy and Dr. Emelda Fear. Will look into coupons for Xarelto. Considered Lovenox, but it is also expensive and requires injections. Considered Coumadin, but CNM is concerned that pt doesn't have F/U arranged and verbalized that she does not like to go to healthcare providers. Instructed pt to call [facility] pharmacy in am to see what their price will be since they have lower proces than most other pharmacies.  Instructed her to call [facility] if she will have difficulty affording Xarelto and that it wil be easier to make an alternate plan  During business hours.   MDM - Left DVT w/out evidence of PE. Nml LLE pulses. Appropriate for OP Tx. PE precautions. Needs to F/U w/ PCP [facility] or [facility]) who may refer her to Heme. Stop OCPs. Do not use estrogens again and use progestins will caution.  - AUB. Stop OCPs. Will need to F/U w/ [facility] for complete eval of AUB and to discuss management  options in light of DVT Dx.    ASSESSMENT 1. Acute deep vein thrombosis (DVT) of tibial vein of left lower extremity (HCC)   2. Adverse effect of combined estrogen and progestogen preparation, initial encounter     PLAN Discharge home in stable condition. PE precautions Follow-up Information    [facility] Follow [facility].   Specialty:  Obstetrics and Gynecology Why:  to discuss management of abnormal uterine bleeding Contact information: [address] [place] [address] [phone]       [facility] Follow up.   Why:  call to schedule follow-up appointment ASAP for deep vein thrombosis.  Contact information: [address] [place] [address] [phone]         Allergies as of 06/11/2018   No Known Allergies     Medication List    STOP taking these medications   ibuprofen 200 MG tablet Commonly known as:  ADVIL,MOTRIN   megestrol 20 MG tablet Commonly known as:  MEGACE   norgestimate-ethinyl estradiol 0.25-35 MG-MCG tablet Commonly known as:  ORTHO-CYCLEN,SPRINTEC,PREVIFEM     TAKE these medications   multivitamin tablet Take 1 tablet by mouth daily.   Rivaroxaban 15 & 20 MG Tbpk Take 1 tablet by mouth as directed. Take as directed on package: Start with one 15mg  tablet by mouth twice a day with food. On  Day 22, switch to one 20mg  tablet once a day with food.        Katrinka Blazing, [place] 06/11/2018  6:21 PM

## 2018-06-11 NOTE — MAU Note (Signed)
Pt presents with c/o VB and pain in left calf.  Reports seen in MAU on July 29 for VB, given BCP's & Megace.  Pt reports calf pain began 5 days ago, no relief with Motrin or Advil.

## 2018-06-11 NOTE — Discharge Instructions (Signed)
Deep Vein Thrombosis Deep vein thrombosis (DVT) is a condition in which a blood clot forms in a deep vein, such as a lower leg, thigh, or arm vein. A clot is blood that has thickened into a gel or solid. This condition is dangerous. It can lead to serious and even life-threatening complications if the clot travels to the lungs and causes a blockage (pulmonary embolism). It can also damage veins in the leg. This can result in leg pain, swelling, discoloration, and sores (post-thrombotic syndrome). What are the causes? This condition may be caused by:  A slowdown of blood flow.  Damage to a vein.  A condition that makes blood clot more easily.  What increases the risk? The following factors may make you more likely to develop this condition:  Being overweight.  Being elderly, especially over age 60.  Sitting or lying down for more than four hours.  Lack of physical activity (sedentary lifestyle).  Being pregnant, giving birth, or having recently given birth.  Taking medicines that contain estrogen.  Smoking.  A history of any of the following: ? Blood clots or blood clotting disease. ? Peripheral vascular disease. ? Inflammatory bowel disease. ? Cancer. ? Heart disease. ? Genetic conditions that affect how blood clots. ? Neurological diseases that affect the legs (leg paresis). ? Injury. ? Major or lengthy surgery. ? A central line placed inside a large vein.  What are the signs or symptoms? Symptoms of this condition include:  Swelling, pain, or tenderness in an arm or leg.  Warmth, redness, or discoloration in an arm or leg.  If the clot is in your leg, symptoms may be more noticeable or worse when you stand or walk. Some people do not have any symptoms. How is this diagnosed? This condition is diagnosed with:  A medical history.  A physical exam.  Tests, such as: ? Blood tests. These are done to see how your blood clots. ? Imaging tests. These are done to  check for clots. Tests may include:  Ultrasound.  CT scan.  MRI.  X-ray.  Venogram. For this test, X-rays are taken after a dye is injected into a vein.  How is this treated? Treatment for this condition depends on the cause, your risk for bleeding or developing more clots, and any medical conditions you have. Treatment may include:  Taking blood thinners (also called anticoagulants). These medicines may be taken by mouth, injected under the skin, or injected through an IV tube (catheter). These medicines prevent clots from forming.  Injecting medicine that dissolves blood clots into the affected vein (catheter-directed thrombolysis).  Having surgery. Surgery may be done to: ? Remove the clot. ? Place a filter in a large vein to catch blood clots before they reach the lungs.  Some treatments may be continued for up to six months. Follow these instructions at home: If you are taking an oral blood thinner:  Take the medicine exactly as told by your health care provider. Some blood thinners need to be taken at the same time every day. Do not skip a dose.  Ask your health care provider about what foods and drugs interact with the medicine.  Ask about possible side effects. General instructions  Blood thinners can cause easy bruising and difficulty stopping bleeding. Because of this, if you are taking or were given a blood thinner: ? Hold pressure over cuts for longer than usual. ? Tell your dentist and other health care providers that you are taking blood thinners before   having any procedures that can cause bleeding. ? Avoid contact sports.  Take over-the-counter and prescription medicines only as told by your health care provider.  Return to your normal activities as told by your health care provider. Ask your health care provider what activities are safe for you.  Wear compression stockings if recommended by your health care provider.  Keep all follow-up visits as told by  your health care provider. This is important. How is this prevented? To lower your risk of developing this condition again:  For 30 or more minutes every day, do an activity that: ? Involves moving your arms and legs. ? Increases your heart rate.  When traveling for longer than four hours: ? Exercise your arms and legs every hour. ? Drink plenty of water. ? Avoid drinking alcohol.  Avoid sitting or lying for a long time without moving your legs.  Stay a healthy weight.  If you are a woman who is older than age 35, avoid unnecessary use of medicines that contain estrogen.  Do not use any products that contain nicotine or tobacco, such as cigarettes and e-cigarettes. This is especially important if you take estrogen medicines. If you need help quitting, ask your health care provider.  Contact a health care provider if:  You miss a dose of your blood thinner.  You have nausea, vomiting, or diarrhea that lasts for more than one day.  Your menstrual period is heavier than usual.  You have unusual bruising. Get help right away if:  You have new or increased pain, swelling, or redness in an arm or leg.  You have numbness or tingling in an arm or leg.  You have shortness of breath.  You have chest pain.  You have a rapid or irregular heartbeat.  You feel light-headed or dizzy.  You cough up blood.  There is blood in your vomit, stool, or urine.  You have a serious fall or accident, or you hit your head.  You have a severe headache or confusion.  You have a cut that will not stop bleeding. These symptoms may represent a serious problem that is an emergency. Do not wait to see if the symptoms will go away. Get medical help right away. Call your local emergency services (911 in the [place] Do not drive yourself to the hospital. Summary  DVT is a condition in which a blood clot forms in a deep vein, such as a lower leg, thigh, or arm vein.  Symptoms can include swelling,  warmth, pain, and redness in your leg or arm.  Treatment may include taking blood thinners, injecting medicine that dissolves blood clots,wearing compression stockings, or surgery.  If you are prescribed blood thinners, take them exactly as told. This information is not intended to replace advice given to you by your health care provider. Make sure you discuss any questions you have with your health care provider. Document Released: 10/18/2005 Document Revised: 11/20/2016 Document Reviewed: 11/20/2016 Elsevier Interactive Patient Education  2018 Elsevier Inc.  

## 2018-06-11 NOTE — Progress Notes (Signed)
VASCULAR LAB PRELIMINARY  PRELIMINARY  PRELIMINARY  PRELIMINARY  Left lower extremity venous duplex completed.    Preliminary report:  There is acute DVT noted in the left posterior tibial vein.    Jodie Leiner, RVT 06/11/2018, 4:17 PM

## 2018-06-12 ENCOUNTER — Emergency Department (HOSPITAL_COMMUNITY)
Admission: EM | Admit: 2018-06-12 | Discharge: 2018-06-12 | Disposition: A | Discharge status: Home / Self Care | Payer: Medicaid Other | Attending: Emergency Medicine | Admitting: Emergency Medicine

## 2018-06-12 ENCOUNTER — Encounter (HOSPITAL_COMMUNITY): Payer: Self-pay | Admitting: Emergency Medicine

## 2018-06-12 DIAGNOSIS — I82442 Acute embolism and thrombosis of left tibial vein: Secondary | ICD-10-CM

## 2018-06-12 DIAGNOSIS — M79605 Pain in left leg: Secondary | ICD-10-CM

## 2018-06-12 DIAGNOSIS — I82492 Acute embolism and thrombosis of other specified deep vein of left lower extremity: Secondary | ICD-10-CM | POA: Insufficient documentation

## 2018-06-12 MED FILL — XARELTO STARTER PACK: 15 & 20 | 28 days supply | Qty: 51 | Fill #0

## 2018-06-12 NOTE — ED Provider Notes (Signed)
[facility] EMERGENCY DEPARTMENT Provider Note   CSN: [id number] Arrival date & time: 06/12/18  0753     History   Chief Complaint Chief Complaint  Patient presents with  . DVT    HPI Kimberly Stuart is a 47 y.o. female.  She presents here with a new diagnosis of a DVT and unable to afford her medication.  She has had a few months worth of vaginal bleeding and was [facility]  She is had about a week's worth of left leg pain primarily in her calf and [facility] and had a [facility] that showed she had a posterior tibial DVT.  She was prescribed Xarelto but has no insurance and is unable to afford it.  She is here today with continued left leg pain that has not been responsive to Tylenol or baby aspirin.  She continues to have some vaginal bleeding but that the spotting level and she was told that she should not take any more hormonal therapy for this.  There is been no fever no chest pain or shortness of breath.  She denies any injury to the leg, no prior history of DVT.  No family history of DVT or PE.  The history is provided by the patient.  Leg Pain   This is a new problem. The current episode started more than 1 week ago. The problem occurs constantly. The problem has been gradually worsening. The pain is present in the left upper leg and left lower leg. The quality of the pain is described as aching. The pain is moderate. Associated symptoms include tingling. She has tried OTC pain medications for the symptoms. The treatment provided no relief. There has been no history of extremity trauma.    Past Medical History:  Diagnosis Date  . Anemia   . Vitamin D deficiency     There are no active problems to display for this patient.   Past Surgical History:  Procedure Laterality Date  . WISDOM TOOTH EXTRACTION       OB History    Gravida  4   Para  3   Term  3   Preterm      AB  1   Living  3     SAB  1   TAB      Ectopic      Multiple      Live Births  3            Home Medications    Prior to Admission medications   Medication Sig Start Date End Date Taking? Authorizing Provider  Multiple Vitamins-Minerals (MULTIVITAMIN) tablet Take 1 tablet by mouth daily. 05/29/18   Calvert CantorWeinhold, Samantha C, CNM  Rivaroxaban 15 & 20 MG TBPK Take 1 tablet by mouth as directed. Take as directed on package: Start with one 15mg  tablet by mouth twice a day with food. On Day 22, switch to one 20mg  tablet once a day with food. 06/11/18   Dorathy KinsmanSmith, [place], CNM    Family History No family history on file.  Social History Social History   Tobacco Use  . Smoking status: Never Smoker  . Smokeless tobacco: Never Used  Substance Use Topics  . Alcohol use: No  . Drug use: No     Allergies   Patient has no known allergies.   Review of Systems Review of Systems  Constitutional: Negative for fever.  HENT: Negative for sore  throat.   Eyes: Negative for visual disturbance.  Respiratory: Negative for shortness of breath.   Cardiovascular: Negative for chest pain.  Gastrointestinal: Negative for abdominal pain.  Genitourinary: Positive for vaginal bleeding. Negative for dysuria.  Musculoskeletal: Negative for back pain.  Skin: Negative for rash and wound.  Neurological: Positive for tingling.     Physical Exam Updated Vital Signs BP 117/82 (BP Location: Right Arm)   Pulse 76   Temp 98.1 F (36.7 C) (Oral)   Resp 17   LMP  (LMP Unknown)   SpO2 100%   Physical Exam  Constitutional: She appears well-developed and well-nourished.  HENT:  Head: Normocephalic and atraumatic.  Eyes: Conjunctivae are normal.  Neck: Neck supple.  Cardiovascular: Normal rate, regular rhythm and normal heart sounds.  Pulmonary/Chest: Effort normal. No stridor. She has no wheezes. She has no rales.  Abdominal: Soft. She exhibits no mass. There is no tenderness. There is no guarding.    Musculoskeletal: Normal range of motion. She exhibits tenderness (left calf).  Neurological: She is alert. No sensory deficit. GCS eye subscore is 4. GCS verbal subscore is 5. GCS motor subscore is 6.  Skin: Skin is warm and dry. Capillary refill takes less than 2 seconds.  Psychiatric: She has a normal mood and affect.  Nursing note and vitals reviewed.    ED Treatments / Results  Labs (all labs ordered are listed, but only abnormal results are displayed) Labs Reviewed - No data to display  EKG None  Radiology No results found.  Procedures Procedures (including critical care time)  Medications Ordered in ED Medications - No data to display   Initial Impression / Assessment and Plan / ED Course  I have reviewed the triage vital signs and the nursing notes.  Pertinent labs & imaging results that were available during my care of the patient were reviewed by me and considered in my medical decision making (see chart for details).  Clinical Course as of Jun 12 1725  Mon Jun 12, 2018  [id number] 47 year old female with aching in her left lower extremity.  She is got intact pulses skin is normal.  Her DVT is posterior tibial when I reviewed the report in epic.  I spoke to pharmacy and they were able to get me the free 30-day card and also recommend that she get a primary and she may qualify for other assistance.  I will review this with the patient.   [MB]    Clinical Course User Index [MB] Terrilee FilesButler, Michael C, MD      Final Clinical Impressions(s) / ED Diagnoses   Final diagnoses:  Left leg pain  Acute deep vein thrombosis (DVT) of left tibial vein [facility](HCC)    ED Discharge Orders    None       Terrilee FilesButler, Michael C, MD 06/12/18 1727

## 2018-06-12 NOTE — Discharge Instructions (Signed)
You were seen in the emergency department for continued pain in your left leg after being diagnosed with a DVT.  We are giving you a card to help with the first 30 days of your medication.  It will be important for you to get a primary care doctor as they may be able to help you with continuing on your medication.  Please return if any worsening symptoms.

## 2018-06-12 NOTE — ED Triage Notes (Signed)
Patient complains of left leg pain x6 days, [facility], diagnosed with DVT and prescribed Xarelto. Patient complains of continued pain and being unable to afford Xarelto. Patient states [facility] that [facility] is unable to afford the medication and the prescriber refused to offer an alternative. Patient alert, oriented, and in no apparent distress. Denies shortness of breath.

## 2018-06-13 ENCOUNTER — Encounter: Payer: Self-pay | Admitting: Nurse Practitioner

## 2018-06-13 ENCOUNTER — Ambulatory Visit (INDEPENDENT_AMBULATORY_CARE_PROVIDER_SITE_OTHER): Payer: Self-pay | Admitting: Nurse Practitioner

## 2018-06-13 VITALS — BP 118/63 | HR 89 | Ht 61.5 in | Wt 176.0 lb

## 2018-06-13 DIAGNOSIS — I82402 Acute embolism and thrombosis of unspecified deep veins of left lower extremity: Secondary | ICD-10-CM

## 2018-06-13 DIAGNOSIS — N939 Abnormal uterine and vaginal bleeding, unspecified: Secondary | ICD-10-CM

## 2018-06-13 DIAGNOSIS — E66811 Obesity, class 1: Secondary | ICD-10-CM

## 2018-06-13 DIAGNOSIS — E669 Obesity, unspecified: Secondary | ICD-10-CM

## 2018-06-13 MED ORDER — MEDROXYPROGESTERONE ACETATE 10 MG PO TABS: 20.0000 mg | ORAL_TABLET | Freq: Every day | ORAL | 0 refills | Status: DC

## 2018-06-13 NOTE — Patient Instructions (Addendum)
[facility] to see for insurance assistance. [address]

## 2018-06-13 NOTE — Progress Notes (Signed)
   GYNECOLOGY OFFICE VISIT NOTE   History:  47 y.o. [id number] here today for abnormal vaginal bleeding.  History of being seen in [facility] twice for vaginal bleeding.  Was given contraceptive pills one pack and after 3 weeks was having pain in her left leg.  Was diagnosed with DVT.  Is now on Xarelto.  Does not have insurance but was able to get her first pack for $25.  She continues to have vaginal bleeding and yesterday it was much worse.  Had lots of cramping and she took ibuprofen for the bleeding.  Is wearing Depends underwear to keep from bleeding through her clothing.  She has not had sex in 3 years.  Needs help getting her vaginal bleeding to stop.  Was prescribed Megace but never did pick it up due to the cost.  Past Medical History:  Diagnosis Date  . Anemia   . Vitamin D deficiency     Past Surgical History:  Procedure Laterality Date  . WISDOM TOOTH EXTRACTION      The following portions of the patient's history were reviewed and updated as appropriate: allergies, current medications, past family history, past medical history, past social history, past surgical history and problem list.   Health Maintenance:  Normal pap in 2017 at [facility].  No records in her chart at this time.  Normal mammogram in 2012.   Review of Systems:  Pertinent items noted in HPI and remainder of comprehensive ROS otherwise negative.  Objective:  Physical Exam BP 118/63   Pulse 89   Ht 5' 1.5" (1.562 m)   Wt 176 lb (79.8 kg)   LMP  (LMP Unknown)   BMI 32.72 kg/m  CONSTITUTIONAL: Well-developed, well-nourished female in no acute distress.  HENT:  Normocephalic, atraumatic. External right and left ear normal.  EYES: Conjunctivae and EOM are normal. Pupils are equal, round.  No scleral icterus.  NECK: Normal range of motion, supple, SKIN: Skin is warm and dry. No rash noted. Not diaphoretic. No erythema. No pallor. NEUROLOGIC: Alert and oriented to person, place, and time. Normal muscle  tone coordination. No cranial nerve deficit noted. PSYCHIATRIC: Normal mood and affect. Normal behavior. Normal judgment and thought content. RESPIRATORY: Effort normal, no problems with respiration noted MUSCULOSKELETAL: Normal range of motion. No edema noted.  Labs and Imaging No results found.  Assessment & Plan:  1. Abnormal vaginal bleeding Consult with Dr. Earlene Plateravis  - Provera prescribed 20 mg daily.  May taper to 10 mg if the bleeding stops but take 10-20 mg daily to keep from having vaginal bleeding. Continue DVT meds and seek primary care to continue to have management and medications prescribed past 30 days.  - US Pelvis Complete; Future - US Transvaginal Non-OB; Future  Please refer to After Visit Summary for other counseling recommendations.   Return in about 2 weeks (around 06/27/2018) for MD appointment for annual and endometrial biopsy.   Total face-to-face time with patient: 15 minutes.  Over 50% of encounter was spent on counseling and coordination of care.  Nolene BernheimERRI Symir Mah, RN, MSN, NP-BC Nurse Practitioner, [facility], [facility] 06/13/2018 4:45 PM

## 2018-06-13 NOTE — Progress Notes (Signed)
US scheduled for August 16th @ 0930.  Pt notified.

## 2018-06-14 LAB — CBC
HEMOGLOBIN: 10.1 g/dL — AB (ref 11.1–15.9)
Hematocrit: 31.2 % — ABNORMAL LOW (ref 34.0–46.6)
MCH: 28.5 pg (ref 26.6–33.0)
MCHC: 32.4 g/dL (ref 31.5–35.7)
MCV: 88 fL (ref 79–97)
PLATELETS: 253 10*3/uL (ref 150–450)
RBC: 3.54 x10E6/uL — ABNORMAL LOW (ref 3.77–5.28)
RDW: 17.8 % — ABNORMAL HIGH (ref 12.3–15.4)
WBC: 6.3 10*3/uL (ref 3.4–10.8)

## 2018-06-15 ENCOUNTER — Telehealth: Payer: Self-pay | Admitting: General Practice

## 2018-06-15 NOTE — Telephone Encounter (Signed)
Patient called & left message on nurse voicemail line stating she saw Kimberly Stuart on 8/13 for extreme vaginal bleeding and was given a Rx for Megace. Patient states the Megace hasn't helped at all and is still bleeding heavy. Patient states Terri advised her to call if this happened.

## 2018-06-16 ENCOUNTER — Ambulatory Visit (HOSPITAL_COMMUNITY)
Admission: RE | Admit: 2018-06-16 | Discharge: 2018-06-16 | Disposition: A | Discharge status: Home / Self Care | Payer: Self-pay | Source: Ambulatory Visit | Attending: Nurse Practitioner | Admitting: Nurse Practitioner

## 2018-06-16 DIAGNOSIS — N939 Abnormal uterine and vaginal bleeding, unspecified: Secondary | ICD-10-CM | POA: Insufficient documentation

## 2018-06-16 NOTE — Telephone Encounter (Signed)
Return call to patient. Patient stated she is changing a pad every 10-15 minutes. Patient is wearing a depend undergarment with a maxi pad. She is having clots about the size of  Kiwi or small tangerine. She is feeling tired and SOB of breathe at times. While speaking with patient she did not seem to be in any distress.  She had an ultrasound completed this morning per Terri Burleson's order,  But was given any results. Pt stated that the Provera is not helping with the bleeding. She is taking it as prescribed.  Called [facility], advised patient should be seen at [facility] if she could not wait until her next appt. Next appt is for endometrial biopsy.  Pt stated last time she went to [facility], she was told that [facility] could not do anything [facility] Will forward to Nolene Bernheimerri Burleson,  NP. Pt stated Terri told her to call clinic if bleeding has not subsided.  Clovis PuMartin, Huie Ghuman L, RN

## 2018-06-19 ENCOUNTER — Telehealth: Payer: Self-pay | Admitting: *Deleted

## 2018-06-19 ENCOUNTER — Other Ambulatory Visit: Payer: Self-pay | Admitting: Family Medicine

## 2018-06-19 ENCOUNTER — Encounter: Payer: Self-pay | Admitting: *Deleted

## 2018-06-19 DIAGNOSIS — N939 Abnormal uterine and vaginal bleeding, unspecified: Secondary | ICD-10-CM

## 2018-06-19 MED ORDER — MEGESTROL ACETATE 40 MG PO TABS: 40.0000 mg | ORAL_TABLET | Freq: Two times a day (BID) | ORAL | 3 refills | Status: DC

## 2018-06-19 NOTE — Telephone Encounter (Addendum)
Pt left message on 8/16 stating that she is still having excessive bleeding despite taking the Progesterone.  She is also on Xarelto. She stated she had US on 8/15 and has not been given any results.   8/19 1036  Pt left additional message today @ [phone] stating that she is still having significant bleeding.  She is taking the Xarelto and Progesterone as advised last week. She would like a call back ASAP and to receive results of her US from last week.   8/19 1150  Pt history and concerns reviewed by Dr. Shawnie PonsPratt. I called pt and advised that she should stop taking Progesterone and begin Megace - Rx sent to pharmacy. Pt had multiple questions regarding medication in light of having DVT and also how long it might take for her bleeding to subside. I stated that I will check with Dr. Shawnie PonsPratt and send a response to her MyChart. Pt voiced understanding. I verified w/Dr. Shawnie PonsPratt that pt may take the Megace and that it should take 1-3 days for her to notice results. If she has not had a decrease in her bleeding she may increase the medication to 1 tablet 3 times daily. Message with this information was sent to pt's MyChart.

## 2018-07-05 ENCOUNTER — Encounter: Payer: Self-pay | Admitting: Obstetrics and Gynecology

## 2018-07-05 ENCOUNTER — Ambulatory Visit: Payer: Medicaid Other | Admitting: Obstetrics and Gynecology

## 2018-07-05 ENCOUNTER — Other Ambulatory Visit (HOSPITAL_COMMUNITY)
Admission: RE | Admit: 2018-07-05 | Discharge: 2018-07-05 | Disposition: A | Discharge status: Home / Self Care | Payer: Medicaid Other | Source: Ambulatory Visit | Attending: Obstetrics and Gynecology | Admitting: Obstetrics and Gynecology

## 2018-07-05 VITALS — BP 120/68 | HR 99 | Ht 61.5 in | Wt 175.6 lb

## 2018-07-05 DIAGNOSIS — N939 Abnormal uterine and vaginal bleeding, unspecified: Secondary | ICD-10-CM | POA: Diagnosis present

## 2018-07-05 LAB — POCT PREGNANCY, URINE: Preg Test, Ur: NEGATIVE

## 2018-07-05 NOTE — Patient Instructions (Signed)

## 2018-07-05 NOTE — Progress Notes (Signed)
Patient ID: Kimberly Stuart, female   DOB: May 05, 1971, 47 y.o.   MRN: [id number] ENDOMETRIAL BIOPSY     The indications for endometrial biopsy were reviewed.   Risks of the biopsy including cramping, bleeding, infection, uterine perforation, inadequate specimen and need for additional procedures  were discussed. The patient states she understands and agrees to undergo procedure today. Consent was signed. Time out was performed. Urine HCG was negative. During the pelvic exam, the cervix was prepped with Betadine. A single-toothed tenaculum was placed on the anterior lip of the cervix to stabilize it. The 3 mm pipelle was introduced into the endometrial cavity without difficulty to a depth of 8cm, and a moderate amount of tissue was obtained and sent to pathology. The instruments were removed from the patient's vagina. Minimal bleeding from the cervix was noted. The patient tolerated the procedure well. Routine post-procedure instructions were given to the patient.    Nettie Elm, MD

## 2018-07-05 NOTE — Progress Notes (Signed)
Taking megace 3x a day ,has slowed bleeding but not stopped it. Pt states is having restless legs all night & day, started w/ medication.

## 2018-07-10 ENCOUNTER — Emergency Department (HOSPITAL_COMMUNITY): Payer: Medicaid Other

## 2018-07-10 ENCOUNTER — Inpatient Hospital Stay (HOSPITAL_COMMUNITY): Payer: Medicaid Other

## 2018-07-10 ENCOUNTER — Other Ambulatory Visit: Payer: Self-pay

## 2018-07-10 ENCOUNTER — Encounter (HOSPITAL_COMMUNITY): Payer: Self-pay | Admitting: *Deleted

## 2018-07-10 ENCOUNTER — Inpatient Hospital Stay (HOSPITAL_COMMUNITY)
Admission: EM | Admit: 2018-07-10 | Discharge: 2018-07-14 | DRG: 812 | Disposition: A | Discharge status: Home / Self Care | Payer: Medicaid Other | Attending: Internal Medicine | Admitting: Internal Medicine

## 2018-07-10 DIAGNOSIS — D62 Acute posthemorrhagic anemia: Secondary | ICD-10-CM | POA: Diagnosis present

## 2018-07-10 DIAGNOSIS — Z79899 Other long term (current) drug therapy: Secondary | ICD-10-CM | POA: Diagnosis not present

## 2018-07-10 DIAGNOSIS — N939 Abnormal uterine and vaginal bleeding, unspecified: Secondary | ICD-10-CM | POA: Diagnosis present

## 2018-07-10 DIAGNOSIS — N921 Excessive and frequent menstruation with irregular cycle: Secondary | ICD-10-CM | POA: Diagnosis present

## 2018-07-10 DIAGNOSIS — D649 Anemia, unspecified: Secondary | ICD-10-CM | POA: Diagnosis present

## 2018-07-10 DIAGNOSIS — Z9889 Other specified postprocedural states: Secondary | ICD-10-CM | POA: Diagnosis not present

## 2018-07-10 DIAGNOSIS — D500 Iron deficiency anemia secondary to blood loss (chronic): Secondary | ICD-10-CM | POA: Diagnosis present

## 2018-07-10 DIAGNOSIS — E669 Obesity, unspecified: Secondary | ICD-10-CM | POA: Diagnosis present

## 2018-07-10 DIAGNOSIS — I82402 Acute embolism and thrombosis of unspecified deep veins of left lower extremity: Secondary | ICD-10-CM | POA: Diagnosis not present

## 2018-07-10 DIAGNOSIS — R1011 Right upper quadrant pain: Secondary | ICD-10-CM

## 2018-07-10 DIAGNOSIS — Z86718 Personal history of other venous thrombosis and embolism: Secondary | ICD-10-CM | POA: Diagnosis not present

## 2018-07-10 DIAGNOSIS — Z7901 Long term (current) use of anticoagulants: Secondary | ICD-10-CM | POA: Diagnosis not present

## 2018-07-10 DIAGNOSIS — E876 Hypokalemia: Secondary | ICD-10-CM | POA: Diagnosis present

## 2018-07-10 DIAGNOSIS — E872 Acidosis: Secondary | ICD-10-CM | POA: Diagnosis present

## 2018-07-10 DIAGNOSIS — Z87410 Personal history of cervical dysplasia: Secondary | ICD-10-CM

## 2018-07-10 DIAGNOSIS — Z888 Allergy status to other drugs, medicaments and biological substances status: Secondary | ICD-10-CM | POA: Diagnosis not present

## 2018-07-10 DIAGNOSIS — I781 Nevus, non-neoplastic: Secondary | ICD-10-CM

## 2018-07-10 DIAGNOSIS — Z6832 Body mass index (BMI) 32.0-32.9, adult: Secondary | ICD-10-CM

## 2018-07-10 DIAGNOSIS — E559 Vitamin D deficiency, unspecified: Secondary | ICD-10-CM | POA: Diagnosis present

## 2018-07-10 DIAGNOSIS — Z598 Other problems related to housing and economic circumstances: Secondary | ICD-10-CM | POA: Diagnosis not present

## 2018-07-10 DIAGNOSIS — I82432 Acute embolism and thrombosis of left popliteal vein: Secondary | ICD-10-CM | POA: Diagnosis present

## 2018-07-10 DIAGNOSIS — R102 Pelvic and perineal pain: Secondary | ICD-10-CM

## 2018-07-10 DIAGNOSIS — Z599 Problem related to housing and economic circumstances, unspecified: Secondary | ICD-10-CM | POA: Diagnosis not present

## 2018-07-10 HISTORY — DX: Anemia, unspecified: D64.9

## 2018-07-10 LAB — CBC WITH DIFFERENTIAL/PLATELET
Abs Immature Granulocytes: 0 10*3/uL (ref 0.0–0.1)
Basophils Absolute: 0 10*3/uL (ref 0.0–0.1)
Basophils Relative: 1 %
EOS ABS: 0.1 10*3/uL (ref 0.0–0.7)
EOS PCT: 1 %
HCT: 20.5 % — ABNORMAL LOW (ref 36.0–46.0)
Hemoglobin: 6.1 g/dL — CL (ref 12.0–15.0)
IMMATURE GRANULOCYTES: 0 %
Lymphocytes Relative: 11 %
Lymphs Abs: 0.7 10*3/uL (ref 0.7–4.0)
MCH: 24.7 pg — ABNORMAL LOW (ref 26.0–34.0)
MCHC: 29.8 g/dL — ABNORMAL LOW (ref 30.0–36.0)
MCV: 83 fL (ref 78.0–100.0)
Monocytes Absolute: 0.4 10*3/uL (ref 0.1–1.0)
Monocytes Relative: 7 %
NEUTROS PCT: 80 %
Neutro Abs: 4.8 10*3/uL (ref 1.7–7.7)
Platelets: 264 10*3/uL (ref 150–400)
RBC: 2.47 MIL/uL — ABNORMAL LOW (ref 3.87–5.11)
RDW: 19.5 % — AB (ref 11.5–15.5)
WBC: 6 10*3/uL (ref 4.0–10.5)

## 2018-07-10 LAB — CBC
HCT: 22.3 % — ABNORMAL LOW (ref 36.0–46.0)
Hemoglobin: 6.8 g/dL — CL (ref 12.0–15.0)
MCH: 25.9 pg — AB (ref 26.0–34.0)
MCHC: 30.5 g/dL (ref 30.0–36.0)
MCV: 84.8 fL (ref 78.0–100.0)
PLATELETS: 197 10*3/uL (ref 150–400)
RBC: 2.63 MIL/uL — ABNORMAL LOW (ref 3.87–5.11)
RDW: 18.2 % — ABNORMAL HIGH (ref 11.5–15.5)
WBC: 4.6 10*3/uL (ref 4.0–10.5)

## 2018-07-10 LAB — COMPREHENSIVE METABOLIC PANEL
ALBUMIN: 3.5 g/dL (ref 3.5–5.0)
ALK PHOS: 52 U/L (ref 38–126)
ALT: 11 U/L (ref 0–44)
AST: 16 U/L (ref 15–41)
Anion gap: 15 (ref 5–15)
BILIRUBIN TOTAL: 0.6 mg/dL (ref 0.3–1.2)
BUN: 13 mg/dL (ref 6–20)
CO2: 17 mmol/L — ABNORMAL LOW (ref 22–32)
Calcium: 8.9 mg/dL (ref 8.9–10.3)
Chloride: 103 mmol/L (ref 98–111)
Creatinine, Ser: 1.01 mg/dL — ABNORMAL HIGH (ref 0.44–1.00)
GFR calc Af Amer: >60 mL/min (ref >60)
GFR calc non Af Amer: >60 mL/min (ref >60)
GLUCOSE: 116 mg/dL — AB (ref 70–99)
POTASSIUM: 3.1 mmol/L — AB (ref 3.5–5.1)
Sodium: 135 mmol/L (ref 135–145)
Total Protein: 6.9 g/dL (ref 6.5–8.1)

## 2018-07-10 LAB — ABO/RH: ABO/RH(D): A POS

## 2018-07-10 LAB — PREPARE RBC (CROSSMATCH)

## 2018-07-10 LAB — I-STAT BETA HCG BLOOD, ED (MC, WL, AP ONLY): I-stat hCG, quantitative: 5.6 m[IU]/mL — ABNORMAL HIGH (ref <5)

## 2018-07-10 LAB — HCG, QUANTITATIVE, PREGNANCY

## 2018-07-10 LAB — MRSA PCR SCREENING: MRSA by PCR: NEGATIVE

## 2018-07-10 MED ORDER — ONDANSETRON HCL 4 MG/2ML IJ SOLN: 4.0000 mg | Freq: Four times a day (QID) | INTRAMUSCULAR | Status: DC | PRN

## 2018-07-10 MED ORDER — ENSURE ENLIVE PO LIQD
237.0000 mL | Freq: Two times a day (BID) | ORAL | Status: DC
  Administered 2018-07-11 – 2018-07-13 (×6): 237 mL via ORAL

## 2018-07-10 MED ORDER — TRANEXAMIC ACID 650 MG PO TABS
1300.0000 mg | ORAL_TABLET | Freq: Three times a day (TID) | ORAL | Status: AC
  Administered 2018-07-10 (×2): 1300 mg via ORAL
  Filled 2018-07-10 (×2): qty 2

## 2018-07-10 MED ORDER — ACETAMINOPHEN 650 MG RE SUPP: 650.0000 mg | Freq: Four times a day (QID) | RECTAL | Status: DC | PRN

## 2018-07-10 MED ORDER — ENOXAPARIN SODIUM 40 MG/0.4ML ~~LOC~~ SOLN
40.0000 mg | SUBCUTANEOUS | Status: DC
  Administered 2018-07-10 – 2018-07-11 (×2): 40 mg via SUBCUTANEOUS
  Filled 2018-07-10 (×3): qty 0.4

## 2018-07-10 MED ORDER — SODIUM CHLORIDE 0.9% FLUSH
3.0000 mL | Freq: Two times a day (BID) | INTRAVENOUS | Status: DC
  Administered 2018-07-11 – 2018-07-13 (×6): 3 mL via INTRAVENOUS

## 2018-07-10 MED ORDER — OXYCODONE HCL 5 MG PO TABS
5.0000 mg | ORAL_TABLET | ORAL | Status: DC | PRN
  Administered 2018-07-10: 5 mg via ORAL
  Filled 2018-07-10: qty 1

## 2018-07-10 MED ORDER — MEGESTROL ACETATE 40 MG PO TABS: 40.0000 mg | ORAL_TABLET | Freq: Two times a day (BID) | ORAL | Status: DC

## 2018-07-10 MED ORDER — ONDANSETRON HCL 4 MG/2ML IJ SOLN
4.0000 mg | Freq: Once | INTRAMUSCULAR | Status: AC
  Administered 2018-07-10: 4 mg via INTRAVENOUS
  Filled 2018-07-10: qty 2

## 2018-07-10 MED ORDER — SODIUM CHLORIDE 0.9% IV SOLUTION
Freq: Once | INTRAVENOUS | Status: AC
  Administered 2018-07-10: via INTRAVENOUS

## 2018-07-10 MED ORDER — SODIUM CHLORIDE 0.9 % IV SOLN
INTRAVENOUS | Status: AC
  Administered 2018-07-10 (×2): via INTRAVENOUS

## 2018-07-10 MED ORDER — ONDANSETRON HCL 4 MG PO TABS: 4.0000 mg | ORAL_TABLET | Freq: Four times a day (QID) | ORAL | Status: DC | PRN

## 2018-07-10 MED ORDER — SODIUM CHLORIDE 0.9 % IV BOLUS
1000.0000 mL | Freq: Once | INTRAVENOUS | Status: AC
  Administered 2018-07-10: 1000 mL via INTRAVENOUS

## 2018-07-10 MED ORDER — MORPHINE SULFATE (PF) 4 MG/ML IV SOLN
4.0000 mg | Freq: Once | INTRAVENOUS | Status: AC
  Administered 2018-07-10: 4 mg via INTRAVENOUS
  Filled 2018-07-10: qty 1

## 2018-07-10 MED ORDER — TRANEXAMIC ACID 650 MG PO TABS
1300.0000 mg | ORAL_TABLET | Freq: Once | ORAL | Status: AC
  Administered 2018-07-10: 1300 mg via ORAL
  Filled 2018-07-10: qty 2

## 2018-07-10 MED ORDER — FERROUS SULFATE 325 (65 FE) MG PO TABS
325.0000 mg | ORAL_TABLET | Freq: Every day | ORAL | Status: DC
  Administered 2018-07-11 – 2018-07-13 (×3): 325 mg via ORAL
  Filled 2018-07-10 (×4): qty 1

## 2018-07-10 MED ORDER — OXYCODONE HCL 5 MG PO TABS
5.0000 mg | ORAL_TABLET | ORAL | Status: DC | PRN
  Administered 2018-07-10 – 2018-07-12 (×5): 5 mg via ORAL
  Filled 2018-07-10 (×7): qty 1

## 2018-07-10 MED ORDER — SODIUM CHLORIDE 0.9 % IV SOLN: INTRAVENOUS | Status: DC

## 2018-07-10 MED ORDER — ACETAMINOPHEN 325 MG PO TABS: 650.0000 mg | ORAL_TABLET | Freq: Four times a day (QID) | ORAL | Status: DC | PRN

## 2018-07-10 MED ORDER — SODIUM CHLORIDE 0.9 % IV SOLN
10.0000 mL/h | Freq: Once | INTRAVENOUS | Status: AC
  Administered 2018-07-10: 10 mL/h via INTRAVENOUS

## 2018-07-10 MED ORDER — MEGESTROL ACETATE 40 MG PO TABS
40.0000 mg | ORAL_TABLET | Freq: Two times a day (BID) | ORAL | Status: DC
  Administered 2018-07-10 – 2018-07-11 (×2): 40 mg via ORAL
  Filled 2018-07-10 (×2): qty 1

## 2018-07-10 MED ORDER — KETOROLAC TROMETHAMINE 30 MG/ML IJ SOLN
15.0000 mg | Freq: Once | INTRAMUSCULAR | Status: AC
  Administered 2018-07-10: 15 mg via INTRAVENOUS
  Filled 2018-07-10: qty 1

## 2018-07-10 MED ORDER — POTASSIUM CHLORIDE CRYS ER 20 MEQ PO TBCR
40.0000 meq | EXTENDED_RELEASE_TABLET | Freq: Once | ORAL | Status: AC
  Administered 2018-07-10: 40 meq via ORAL
  Filled 2018-07-10: qty 2

## 2018-07-10 MED ORDER — NAPROXEN 250 MG PO TABS
500.0000 mg | ORAL_TABLET | Freq: Two times a day (BID) | ORAL | Status: DC
  Administered 2018-07-10 – 2018-07-12 (×5): 500 mg via ORAL
  Filled 2018-07-10 (×6): qty 2

## 2018-07-10 NOTE — Progress Notes (Signed)
Notified MD sanstos of HGB 6.8 after  2 units Prbc  Infused  Earlier.

## 2018-07-10 NOTE — ED Notes (Signed)
ED Provider at bedside. 

## 2018-07-10 NOTE — H&P (Addendum)
Date: 07/10/2018               Patient Name:  Kimberly Stuart MRN: [id number]  DOB: Jan 26, 1971 Age / Sex: 47 y.o., female   PCP: Patient, No Pcp Per         Medical Service: Internal Medicine Teaching Service         Attending Physician: Dr. Inez Catalina, MD    First Contact: Dr. Guinevere Scarlet Pager: [phone]  Second Contact: Dr. Armstead Peaks Pager: [phone]       After Hours (After 5p/  First Contact Pager: [phone]  weekends / holidays): Second Contact Pager: [phone]   Chief Complaint: Vaginal bleeding, weakness  History of Present Illness:  This is a 63 [id number] F who presented for evaluation of increased pelvic pain, vaginal bleeding and weakness. She has history of heavy vaginal bleeding for several months which is being managed at [facility]. Pelvic ultrasound normal and trial of OCPs was complicated by development on left LE DVT. She was instead placed on Megace and Xarelto however bleeding persisted and intensified following endometrial biopsy 9/4. Felt OK the day of the procedure however notes significant increase in pain and bleeding since, with passage of a large clot this morning. Pain described as constant, sharp and radiates across her entire pelvis. Worse with movement or activity. She admits to feeling light-headed with near-syncopal episodes particularly with standing, and fatigue. She denies any fevers, chills, SOB or chest pain.  In the ED, vital signs initially with tachycardia to 110, BP 130/84, respirations 26 and was saturating 100% on RA although BP recorded as low as 96/56. CMET with K 3.1, CO2 17 but otherwise wnl. CBC notable for acute normocytic anemia with Hb 6.1 and MCV 83. Platelets wnl. Dr. Debroah Loop (OB/GYN) was contacted by ED who recommended starting TXA 1300mg  TID, transfusion, continuing Megace and scheduled NSAIDs. They also recommended outpatient management. Pain improved with morphine and Toradol and IMTS was contacted for admission for management  of acute symptomatic anemia.   Meds:  Current Meds  Medication Sig  . ferrous sulfate 325 (65 FE) MG tablet Take 325 mg by mouth daily with breakfast.  . magnesium (MAGTAB) 84 MG ( ) TBCR SR tablet Take 250 mg by mouth daily.   . megestrol (MEGACE) 40 MG tablet Take 1 tablet (40 mg total) by mouth 2 (two) times daily.  . Rivaroxaban 15 & 20 MG TBPK Take 1 tablet by mouth as directed. Take as directed on package: Start with one 15mg  tablet by mouth twice a day with food. On Day 22, switch to one 20mg  tablet once a day with food.   Allergies: Allergies as of 07/10/2018  . (No Known Allergies)   Past Medical History:  Diagnosis Date  . Anemia   . Vitamin D deficiency    Family History: No family history of bleeding disorders.  Social History: She does not smoke, drink alcohol or use recreational drugs.  Review of Systems: A complete ROS was negative except as per HPI.  Physical Exam: Blood pressure (!) 96/56, pulse 78, temperature 98.5 F (36.9 C), temperature source Oral, resp. rate 16, SpO2 100 %. General: Obese caucasian female resting in bed. In no acute distress. Appears anxious and fatigued.  HENT: PERRL. EOMI. No conjunctival injection, icterus or ptosis. Oropharynx clear, mucous membranes dry.  Cardiovascular: Regular rate and rhythm. No murmur or rub appreciated. Pulmonary: CTA BL, no wheezing, crackles or rhonchi appreciated. Unlabored breathing.  Abdomen: Soft, not  distended. TTP throughout, most pronounced in lower quadrants and suprapubic region. No guarding or rigidity. +bowel sounds.  Extremities: No peripheral edema noted BL. Intact distal pulses.  Skin: Warm, dry. No cyanosis. Neuro: Strength and sensation grossly intact.  Psych: Anxious. Responds to questions appropriately.   CMP Latest Ref Rng & Units 07/10/2018 06/11/2018 07/31/2016  Glucose 70 - 99 mg/dL 793(J) 030(S) 923(R)  BUN 6 - 20 mg/dL 13 10 12   Creatinine 0.44 - 1.00 mg/dL 0.07(M) 2.26 3.33(L)  Sodium  135 - 145 mmol/L 135 133(L) 137  Potassium 3.5 - 5.1 mmol/L 3.1(L) 3.8 3.4(L)  Chloride 98 - 111 mmol/L 103 101 106  CO2 22 - 32 mmol/L 17(L) 22 21(L)  Calcium 8.9 - 10.3 mg/dL 8.9 4.5(G) 9.5  Total Protein 6.5 - 8.1 g/dL 6.9 7.2 7.1  Total Bilirubin 0.3 - 1.2 mg/dL 0.6 0.4 0.9  Alkaline Phos 38 - 126 U/L 52 57 52  AST 15 - 41 U/L 16 17 23   ALT 0 - 44 U/L 11 16 17    CBC Latest Ref Rng & Units 07/10/2018 06/13/2018 05/29/2018  WBC 4.0 - 10.5 K/uL 6.0 6.3 6.9  Hemoglobin 12.0 - 15.0 g/dL 6.1(LL) 10.1(L) 10.4(L)  Hematocrit 36.0 - 46.0 % 20.5(L) 31.2(L) 30.6(L)  Platelets 150 - 400 K/uL 264 253 244   Pelvic US pending  US PELVIS 06/16/18: Normal  Assessment & Plan by Problem: 47 y/o F with several month history of menometrorrhagia and recent DVT on Xarelto who presents 5-days after endometrial biopsy with increasing pelvic pain, vaginal bleeding and symptomatic anemia despite compliance with TID Megace. D/w GYN, recommend TXA, Megace and outpatient follow-up.   Active Problems:   Symptomatic anemia  Acute Blood Loss Anemia Menometrorrhagia  Patient with several-month history of menometrorrhagia with benign endometrial biopsy 9/4.  Hb 6.1 on admission without evidence for hemolysis and frank blood was appreciated from cervical os. 2 units pRBCs ordered and are transfusing. Case d/w GYN in ED who recommend TXA 1300 mg TID, continuing Megace, scheduled NSAID's and outpatient follow-up (no need for inpatient hysterectomy). She follows with [facility], MD primary GYN) and her work-up including Pelvic US and endometrial biopsy have been normal. GYN is attempting hormonal management however she was diagnosed with LLE DVT 3-weeks after starting combined-OCPs. She's currently prescribed Xarelto and Megace 40mg  BID however there is some documentation in chart that patient has had some difficulty affording medications. Ultimately, she requires definitive management of her ongoing  menometrorrhagia which now requires transfusion although GYN suggests this could be completed outpatient. Of note, will order Tranexamic acid for 1 day only given known DVT. -Admit to SDU given acute ongoing blood loss anemia and soft BP -2u pRBCs transfusing, f/u post-transfusion H&H -FU Pelvic US -Oxy IR 5mg  Q4H prn mod-severe pain -Megace 40mg  BID -Tranexamic acid 1300mg  TID x1 day, re-evaluate in AM for further doses -If bleeding refractory to Tranexamic acid by AM, consider GYN re-consultation for definitive management -May benefit from bleeding work-up including vWF however this is an acute-phase reactant  Acute DVT of LLE DVT, Provoked Developed DVT after trial of combination OCPs. Has since started Xarelto and transitioned to progesterone-only regimen. Tranexamic acid was recommended by GYN and given in ED which certainly increases DVT risk.  -Hold Xarelto in setting of acute anemia requiring transfusion and ongoing vaginal bleeding; will do Prophylactic Lovenox* -No SCDs given known DVT  Metabolic Acidosis Hypokalemia K 3.0 and CO2 17 on admission in setting of acute blood loss. Repeat  values in AM after replacement.  Difficulty Affording Medications  I see through chart review that she's had some troubles affording her medications (Xarelto, Megace) recently. Have consulted CM for medication assistance. Of note, I do believe she also requires a PCP.   Diet: Regular IVF: 1L bolus in ED; ns @150mls /hr x 16 hrs DVT ppx: Prophylactic Lovenox Code: FULL  Dispo: Admit patient to Inpatient with expected length of stay greater than 2 midnights.  SignedNoemi Chapel, DO 07/10/2018, 10:28 AM  Pager: [phone]

## 2018-07-10 NOTE — ED Triage Notes (Signed)
Patient here for for abdominal pain and vaginal bleeding. Had a endometrial biopsy on Wednesday and has no relief since that time.

## 2018-07-10 NOTE — ED Provider Notes (Addendum)
[facility] EMERGENCY DEPARTMENT Provider Note   CSN: [id number] Arrival date & time: 07/10/18  0559     History   Chief Complaint Chief Complaint  Patient presents with  . Abdominal Pain    HPI Kimberly Stuart is a 47 y.o. [phone] female who presents with pelvic pain and vaginal bleeding. PMH significant for DUB and recent hx of DVT on Xarelto. She states she's been having heavy bleeding for months. She has been back and forth to [facility] and was on birth control and then developed a distal DVT in the left popliteal vein. She was put on Xarelto and has been taking Megace. [facility], had a pelvic US which was negative, and then had an endometrial biopsy on 9/4. She states that she felt okay the day after but since then has had gradually worsening sharp pain across her entire pelvis. It is constant and worse with movement. Additionally she states that she feels like she will pass out with standing. She hasn't had an appetite because of the pain. No fever, nausea, vomiting. This morning in the shower she passed "a clot this size of my fist" so came to the ED.  HPI  Past Medical History:  Diagnosis Date  . Anemia   . Vitamin D deficiency     Patient Active Problem List   Diagnosis Date Noted  . Abnormal vaginal bleeding 06/13/2018  . Acute deep vein thrombosis (DVT) of left lower extremity (HCC) 06/13/2018  . Obesity (BMI 30.0-34.9) 06/13/2018    Past Surgical History:  Procedure Laterality Date  . WISDOM TOOTH EXTRACTION       OB History    Gravida  4   Para  3   Term  3   Preterm      AB  1   Living  3     SAB  1   TAB      Ectopic      Multiple      Live Births  3            Home Medications    Prior to Admission medications   Medication Sig Start Date End Date Taking? Authorizing Provider  ferrous sulfate 325 (65 FE) MG tablet Take 325 mg by mouth daily with breakfast.    [provider]  magnesium  (MAGTAB) 84 MG ( ) TBCR SR tablet Take 250 mg by mouth.    [provider]  megestrol (MEGACE) 40 MG tablet Take 1 tablet (40 mg total) by mouth 2 (two) times daily. 06/19/18   Reva Bores, MD  Multiple Vitamins-Minerals (MULTIVITAMIN) tablet Take 1 tablet by mouth daily. Patient not taking: Reported on 07/05/2018 05/29/18   Calvert Cantor, CNM  Rivaroxaban 15 & 20 MG TBPK Take 1 tablet by mouth as directed. Take as directed on package: Start with one 15mg  tablet by mouth twice a day with food. On Day 22, switch to one 20mg  tablet once a day with food. 06/11/18   Dorathy Kinsman, CNM    Family History No family history on file.  Social History Social History   Tobacco Use  . Smoking status: Never Smoker  . Smokeless tobacco: Never Used  Substance Use Topics  . Alcohol use: No  . Drug use: No     Allergies   Patient has no known allergies.   Review of Systems Review of Systems  Constitutional: Negative for fever.  Gastrointestinal: Negative for abdominal pain, diarrhea, nausea  and vomiting.  Genitourinary: Positive for pelvic pain and vaginal bleeding. Negative for dysuria and flank pain.  Neurological: Positive for light-headedness. Negative for syncope.  Hematological: Bruises/bleeds easily.  All other systems reviewed and are negative.    Physical Exam Updated Vital Signs BP 115/60 (BP Location: Left Arm)   Pulse (!) 110   Temp 99.3 F (37.4 C) (Oral)   Resp (!) 26   LMP  (LMP Unknown)   SpO2 100%   Physical Exam  Constitutional: She is oriented to person, place, and time. She appears well-developed and well-nourished. No distress.  Anxious but cooperative  HENT:  Head: Normocephalic and atraumatic.  Eyes: Pupils are equal, round, and reactive to light. Conjunctivae are normal. Right eye exhibits no discharge. Left eye exhibits no discharge. No scleral icterus.  Neck: Normal range of motion.  Cardiovascular: Tachycardia present. Exam reveals no  gallop and no friction rub.  No murmur heard. Pulmonary/Chest: Effort normal and breath sounds normal. No respiratory distress.  Abdominal: Soft. Bowel sounds are normal. She exhibits no distension. There is tenderness (Diffuse lower abodminal tenderness).  Genitourinary:  Genitourinary Comments: Pelvic: No inguinal lymphadenopathy or inguinal hernia noted. Normal external genitalia. No pain with speculum insertion. Closed cervical os with normal appearance - no rash or lesions. Blood pooling in vaginal vault. Chaperone present during exam.    Neurological: She is alert and oriented to person, place, and time.  Skin: Skin is warm and dry.  Psychiatric: She has a normal mood and affect. Her behavior is normal.  Nursing note and vitals reviewed.    ED Treatments / Results  Labs (all labs ordered are listed, but only abnormal results are displayed) Labs Reviewed  CBC WITH DIFFERENTIAL/PLATELET - Abnormal; Notable for the following components:      Result Value   RBC 2.47 (*)    Hemoglobin 6.1 (*)    HCT 20.5 (*)    MCH 24.7 (*)    MCHC 29.8 (*)    RDW 19.5 (*)    All other components within normal limits  COMPREHENSIVE METABOLIC PANEL - Abnormal; Notable for the following components:   Potassium 3.1 (*)    CO2 17 (*)    Glucose, Bld 116 (*)    Creatinine, Ser 1.01 (*)    All other components within normal limits  I-STAT BETA HCG BLOOD, ED ([facility], [facility], [facility] ONLY) - Abnormal; Notable for the following components:   I-stat hCG, quantitative 5.6 (*)    All other components within normal limits  TYPE AND SCREEN  PREPARE RBC (CROSSMATCH)  ABO/RH    EKG None  Radiology No results found.  Procedures Procedures (including critical care time)  CRITICAL CARE Performed by: Bethel Born   Total critical care time: 40 minutes  Critical care time was exclusive of separately billable procedures and treating other patients.  Critical care was necessary to treat or prevent  imminent or life-threatening deterioration.  Critical care was time spent personally by me on the following activities: development of treatment plan with patient and/or surrogate as well as nursing, discussions with consultants, evaluation of patient's response to treatment, examination of patient, obtaining history from patient or surrogate, ordering and performing treatments and interventions, ordering and review of laboratory studies, ordering and review of radiographic studies, pulse oximetry and re-evaluation of patient's condition.   Medications Ordered in ED Medications - No data to display   Initial Impression / Assessment and Plan / ED Course  I have reviewed the triage vital  signs and the nursing notes.  Pertinent labs & imaging results that were available during my care of the patient were reviewed by me and considered in my medical decision making (see chart for details).  47 year old non-pregnant female presents with severe pelvic pain, vaginal bleeding, and lightheadedness. BP is soft and she is mildly tachycardic. Otherwise vitals are normal. She is distressed initially which limited my exam. She will be given pain control and fluids.   630AM Informed by nursing the patient's hgb is 6.1. Will add type and screen and order 2 units of blood. CMP is remarkable for mild hypokalemia (3.1), low CO2 (17). Pelvic exam was performed which revealed steady bleeding from the cervix.  10AM Discussed with Dr. Debroah Loop who recommends starting TXA 1300mg  TID, continuing Megace, and to do scheduled NSAIDs.   Discussed with patient. She still wants to know why she is in to much pain. I think it's likely from her heavy bleeding. Morphine and Toradol did help. Will add Pelvic and TVUS. Discussed with Internal medicine who will come to admit.   Final Clinical Impressions(s) / ED Diagnoses   Final diagnoses:  Symptomatic anemia  Vaginal bleeding  Pelvic pain    ED Discharge Orders    None         Bethel Born, PA-C 07/10/18 1043    Bethel Born, PA-C 07/10/18 1044    Long, Arlyss Repress, MD 07/10/18 [phone]

## 2018-07-10 NOTE — ED Notes (Signed)
Blood consent signed

## 2018-07-11 DIAGNOSIS — R102 Pelvic and perineal pain: Secondary | ICD-10-CM

## 2018-07-11 DIAGNOSIS — I82432 Acute embolism and thrombosis of left popliteal vein: Secondary | ICD-10-CM

## 2018-07-11 LAB — BPAM RBC
BLOOD PRODUCT EXPIRATION DATE: 201909232359
Blood Product Expiration Date: 201909182359
Blood Product Expiration Date: 201910032359
ISSUE DATE / TIME: 201909091006
ISSUE DATE / TIME: 201909091331
ISSUE DATE / TIME: 201909092331
UNIT TYPE AND RH: 6200
UNIT TYPE AND RH: 6200
UNIT TYPE AND RH: 6200

## 2018-07-11 LAB — TYPE AND SCREEN
ABO/RH(D): A POS
Antibody Screen: NEGATIVE
Unit division: 0
Unit division: 0
Unit division: 0

## 2018-07-11 LAB — CBC
HCT: 27.6 % — ABNORMAL LOW (ref 36.0–46.0)
HEMATOCRIT: 26.4 % — AB (ref 36.0–46.0)
Hemoglobin: 8.2 g/dL — ABNORMAL LOW (ref 12.0–15.0)
Hemoglobin: 8.7 g/dL — ABNORMAL LOW (ref 12.0–15.0)
MCH: 26 pg (ref 26.0–34.0)
MCH: 26.1 pg (ref 26.0–34.0)
MCHC: 31.1 g/dL (ref 30.0–36.0)
MCHC: 31.5 g/dL (ref 30.0–36.0)
MCV: 83.8 fL (ref 78.0–100.0)
MCV: 82.9 fL (ref 78.0–100.0)
PLATELETS: 174 10*3/uL (ref 150–400)
PLATELETS: 194 10*3/uL (ref 150–400)
RBC: 3.15 MIL/uL — ABNORMAL LOW (ref 3.87–5.11)
RBC: 3.33 MIL/uL — ABNORMAL LOW (ref 3.87–5.11)
RDW: 17.9 % — AB (ref 11.5–15.5)
RDW: 18.3 % — ABNORMAL HIGH (ref 11.5–15.5)
WBC: 4 10*3/uL (ref 4.0–10.5)
WBC: 4.9 10*3/uL (ref 4.0–10.5)

## 2018-07-11 LAB — URINALYSIS, ROUTINE W REFLEX MICROSCOPIC
Bilirubin Urine: NEGATIVE
Glucose, UA: NEGATIVE mg/dL
Ketones, ur: NEGATIVE mg/dL
Nitrite: NEGATIVE
Protein, ur: NEGATIVE mg/dL
Specific Gravity, Urine: 1.009 (ref 1.005–1.030)
pH: 6 (ref 5.0–8.0)

## 2018-07-11 LAB — COMPREHENSIVE METABOLIC PANEL
ALT: 9 U/L (ref 0–44)
AST: 14 U/L — AB (ref 15–41)
Albumin: 2.9 g/dL — ABNORMAL LOW (ref 3.5–5.0)
Alkaline Phosphatase: 47 U/L (ref 38–126)
Anion gap: 9 (ref 5–15)
BUN: 13 mg/dL (ref 6–20)
CO2: 18 mmol/L — ABNORMAL LOW (ref 22–32)
CREATININE: 0.8 mg/dL (ref 0.44–1.00)
Calcium: 8.4 mg/dL — ABNORMAL LOW (ref 8.9–10.3)
Chloride: 112 mmol/L — ABNORMAL HIGH (ref 98–111)
GFR calc Af Amer: >60 mL/min (ref >60)
GFR calc non Af Amer: >60 mL/min (ref >60)
GLUCOSE: 86 mg/dL (ref 70–99)
POTASSIUM: 4.3 mmol/L (ref 3.5–5.1)
SODIUM: 139 mmol/L (ref 135–145)
Total Bilirubin: 0.9 mg/dL (ref 0.3–1.2)
Total Protein: 5.6 g/dL — ABNORMAL LOW (ref 6.5–8.1)

## 2018-07-11 LAB — HIV ANTIBODY (ROUTINE TESTING W REFLEX): HIV SCREEN 4TH GENERATION: NONREACTIVE

## 2018-07-11 MED ORDER — SENNA 8.6 MG PO TABS
1.0000 | ORAL_TABLET | Freq: Every day | ORAL | Status: DC | PRN
  Administered 2018-07-11: 8.6 mg via ORAL
  Filled 2018-07-11: qty 1

## 2018-07-11 MED ORDER — MEGESTROL ACETATE 40 MG PO TABS
40.0000 mg | ORAL_TABLET | ORAL | Status: DC
  Filled 2018-07-11: qty 1

## 2018-07-11 MED ORDER — MEGESTROL ACETATE 40 MG PO TABS
80.0000 mg | ORAL_TABLET | Freq: Two times a day (BID) | ORAL | Status: DC
  Administered 2018-07-11 – 2018-07-13 (×5): 80 mg via ORAL
  Filled 2018-07-11 (×7): qty 2

## 2018-07-11 MED ORDER — MEGESTROL ACETATE 40 MG PO TABS
80.0000 mg | ORAL_TABLET | ORAL | Status: AC
  Administered 2018-07-11: 80 mg via ORAL
  Filled 2018-07-11: qty 2

## 2018-07-11 NOTE — Care Management Note (Addendum)
Case Management Note  Patient Details  Name: Kimberly Stuart MRN: [id number] Date of Birth: 1971/03/22  Subjective/Objective:   From home, presents with symptomatic anemia, patient states she sees Dr. Cyndia Bent for her PCP and would like to keep him as her PCP .  She did not want an apt with the clinic.  She will need Match letter at discharge for ast with medications.  9/12 Letha Cape RN, BSN- NCM gave patient the 30 day free eliquis coupon, patient ast application and Match letter to assist with medications at dc.                    Action/Plan: DC home when ready.  Expected Discharge Date:                  Expected Discharge Plan:  Home/Self Care  In-House Referral:     Discharge planning Services  CM Consult, MATCH Program, Medication Assistance  Post Acute Care Choice:    Choice offered to:     DME Arranged:    DME Agency:     HH Arranged:    HH Agency:     Status of Service:  Completed, signed off  If discussed at [employer] of Stay Meetings, dates discussed:    Additional Comments:  Leone Haven, RN 07/11/2018, 3:44 PM

## 2018-07-11 NOTE — Progress Notes (Addendum)
Initial Nutrition Assessment  DOCUMENTATION CODES:   Obesity unspecified  INTERVENTION:    Ensure Enlive po BID, each supplement provides 350 kcal and 20 grams of protein  NUTRITION DIAGNOSIS:   Increased nutrient needs related to acute illness as evidenced by estimated needs  GOAL:   Patient will meet greater than or equal to 90% of their needs  MONITOR:   PO intake, Supplement acceptance, Skin, Labs, Weight trends, I & O's  REASON FOR ASSESSMENT:   Malnutrition Screening Tool  ASSESSMENT:   47 y/o F with a several month history of menometrorrhagia and recent DVT on Xarelto who presented 5-days after endometrial biopsy with increasing pelvic pain, vaginal bleeding and symptomatic anemia despite compliance with TID Megace. D/w GYN, recommend TXA, Megace and outpatient follow-up.    RD spoke with patient at bedside. Her Mother is present. Pt reports her appetite is fair. Didn't particularly like her breakfast. States PTA she was not eating well due to significant pain.  Has Ensure Enlive supplements ordered. Drank some today. Pt does not know if she's lost weight or her UBW. Medications include Megace & ferrous sulfate. Labs reviewed. Cl 112 (H).  NUTRITION - FOCUSED PHYSICAL EXAM:  Completed. No muscle or fat depletions noticed.  Diet Order:   Diet Order            Diet regular Room service appropriate? Yes; Fluid consistency: Thin  Diet effective now             EDUCATION NEEDS:   No education needs have been identified at this time  Skin:  Skin Assessment: Reviewed RN Assessment  Last BM:  9/8  Height:   Ht Readings from Last 1 Encounters:  07/10/18 5' 1.5" (1.562 m)   Weight:   Wt Readings from Last 1 Encounters:  07/11/18 80.1 kg   BMI:  Body mass index is 32.83 kg/m.  Estimated Nutritional Needs:   Kcal:  1800-2000  Protein:  90-105 gm  Fluid:  1.8-2.0 L  Maureen Chatters, RD, LDN Pager #: [phone] After-Hours Pager #:  [phone]

## 2018-07-11 NOTE — Progress Notes (Signed)
One unit PRBC's infused. Pt tol well. No reaction noted

## 2018-07-11 NOTE — Progress Notes (Signed)
   Subjective: Patient states her pain is reduced this morning after taking pain medication overnight. She says the pain is across her entire abdomen and is like a cramping pain. She continues to have bleeding as well, but this is reduced from yesterday. She describes it as similar to normal menstrual bleeding and changed her pad this morning from last night. She denies nausea but has had no appetite. Her last BM was three days ago and was watery.   Objective:  Vital signs in last 24 hours: Vitals:   07/11/18 0253 07/11/18 0400 07/11/18 0500 07/11/18 0806  BP: 98/69 100/62  92/75  Pulse:  63  62  Resp:  15  17  Temp: 98.1 F (36.7 C) 98.4 F (36.9 C)  98.4 F (36.9 C)  TempSrc: Oral Oral  Oral  SpO2:  99%    Weight:   80.1 kg   Height:       Constitution: anxious, sitting up in bed, pleasant  Cardio: regular rate & rhythm, no m/r/g, nml S1, S2 Respiratory: clear to auscultation bilaterally, no wheezing ralres or rhonchi Abdominal: diffusely TTP, no guarding, soft, non-distended. Ecchymosis over lovenox injection site  MSK: +pulses, warm extremities Neuro: A&O, denies dizziness with ambulating Skin: no bruising or erythema    Assessment/Plan:  Principal Problem:   Symptomatic anemia Active Problems:   Abnormal vaginal bleeding   Acute deep vein thrombosis (DVT) of left lower extremity (HCC)  47 y/o F with several month history of menometrorrhagia and recent DVT on Xarelto who presents 5-days after endometrial biopsy with increasing pelvic pain, vaginal bleeding and symptomatic anemia despite compliance with TID Megace. D/w GYN, recommend TXA, Megace and outpatient follow-up.   Menormetrorrhagia Acute Blood Loss Anemia Transfused 1U o/n for 3U total. Afternoon CBC 8.7. Tranexemic acid given yesterday. Bleeding has decreased but continues along with significant abdominal pain. Discussed further with Dr. Adrian Blackwater of [facility] who recommended increasing megace.  - am CBC - received  one dose 40mg  megace this morning - will give one dose 80mg  megace now & again in the evening. Tomorrow we will continue megace 80mg  bid - follow-up consult with [facility] if bleeding does not resolve by tomorrow  - oxy IR 5mg  q4h prn pain  - cont. Iron 325mg   Left Popliteal DVT: diagnosed 8/12 secondary to OCP treatment, treated with xarelto until yesterday. Switched to prophylactic lovenox due to menormetrorrhagia   - cont. lovenox - monitor for signs of DVT or clotting  - No SCDs  Hypokalemia: resolved   - am BMP   Difficult Affording Medications: Patient states megace affordable but some issues with affording xarelto.   - CM consulted    Diet: regular IVF: none DVT ppx: lovenox  Dispo: Anticipated discharge in approximately 1-2 days.   Guinevere Scarlet A, DO 07/11/2018, 10:58 AM Pager: [phone]

## 2018-07-12 LAB — BASIC METABOLIC PANEL
Anion gap: 11 (ref 5–15)
BUN: 12 mg/dL (ref 6–20)
CALCIUM: 8.6 mg/dL — AB (ref 8.9–10.3)
CO2: 19 mmol/L — ABNORMAL LOW (ref 22–32)
CREATININE: 0.84 mg/dL (ref 0.44–1.00)
Chloride: 108 mmol/L (ref 98–111)
GFR calc Af Amer: >60 mL/min (ref >60)
GFR calc non Af Amer: >60 mL/min (ref >60)
Glucose, Bld: 95 mg/dL (ref 70–99)
Potassium: 3.7 mmol/L (ref 3.5–5.1)
SODIUM: 138 mmol/L (ref 135–145)

## 2018-07-12 LAB — CBC
HCT: 27.1 % — ABNORMAL LOW (ref 36.0–46.0)
Hemoglobin: 8.6 g/dL — ABNORMAL LOW (ref 12.0–15.0)
MCH: 26.1 pg (ref 26.0–34.0)
MCHC: 31.7 g/dL (ref 30.0–36.0)
MCV: 82.4 fL (ref 78.0–100.0)
PLATELETS: 215 10*3/uL (ref 150–400)
RBC: 3.29 MIL/uL — AB (ref 3.87–5.11)
RDW: 18.5 % — AB (ref 11.5–15.5)
WBC: 5.3 10*3/uL (ref 4.0–10.5)

## 2018-07-12 MED ORDER — POLYETHYLENE GLYCOL 3350 17 G PO PACK
17.0000 g | PACK | Freq: Every day | ORAL | Status: DC
  Administered 2018-07-12 – 2018-07-13 (×2): 17 g via ORAL
  Filled 2018-07-12 (×3): qty 1

## 2018-07-12 MED ORDER — SENNA 8.6 MG PO TABS
1.0000 | ORAL_TABLET | Freq: Every day | ORAL | Status: DC
  Administered 2018-07-12 – 2018-07-13 (×2): 8.6 mg via ORAL
  Filled 2018-07-12 (×3): qty 1

## 2018-07-12 MED ORDER — APIXABAN 5 MG PO TABS
5.0000 mg | ORAL_TABLET | Freq: Two times a day (BID) | ORAL | Status: DC
  Administered 2018-07-12 – 2018-07-13 (×4): 5 mg via ORAL
  Filled 2018-07-12 (×5): qty 1

## 2018-07-12 NOTE — Progress Notes (Signed)
ANTICOAGULATION CONSULT NOTE - Initial Consult  Pharmacy Consult for Apixaban Indication: LLE DVT  Allergies  Allergen Reactions  . Estrogens     DVT    Patient Measurements: Height: 5' 1.5" (156.2 cm) Weight: 176 lb 12.9 oz (80.2 kg) IBW/kg (Calculated) : 48.95  Vital Signs: Temp: 98.7 F (37.1 C) (09/11 1212) Temp Source: Oral (09/11 1212) BP: 89/57 (09/11 1212) Pulse Rate: 75 (09/11 1212)  Labs: Recent Labs    07/10/18 0619  07/11/18 0600 07/11/18 1242 07/12/18 0250  HGB 6.1*   < > 8.2* 8.7* 8.6*  HCT 20.5*   < > 26.4* 27.6* 27.1*  PLT 264   < > 174 194 215  CREATININE 1.01*  --  0.80  --  0.84   < > = values in this interval not displayed.    Estimated Creatinine Clearance: 81.2 mL/min (by C-G formula based on SCr of 0.84 mg/dL).   Medical History: Past Medical History:  Diagnosis Date  . Anemia   . Vitamin D deficiency     Medications:  Medications Prior to Admission  Medication Sig Dispense Refill Last Dose  . ferrous sulfate 325 (65 FE) MG tablet Take 325 mg by mouth daily with breakfast.   Past Week at Unknown time  . magnesium (MAGTAB) 84 MG ( ) TBCR SR tablet Take 250 mg by mouth daily.    Past Week at Unknown time  . megestrol (MEGACE) 40 MG tablet Take 1 tablet (40 mg total) by mouth 2 (two) times daily. 30 tablet 3 07/09/2018 at Unknown time  . Rivaroxaban 15 & 20 MG TBPK Take 1 tablet by mouth as directed. Take as directed on package: Start with one 15mg  tablet by mouth twice a day with food. On Day 22, switch to one 20mg  tablet once a day with food. 51 each 0 07/09/2018 at am  . Multiple Vitamins-Minerals (MULTIVITAMIN) tablet Take 1 tablet by mouth daily. (Patient not taking: Reported on 07/05/2018)   Not Taking at Unknown time    Assessment: 47 y/o F with several month history of menometrorrhagia and recent DVT (diagnosed 8/12) on Xarelto who presented 5-days s/p endometrial biopsy with increasing pelvic pain, vaginal bleeding and symptomatic  anemia. Has been on TID Megace and also received TXA x 3 on 9/9. Xarelto was stopped on admit and patient was placed on prophylactic dose Lovenox d/t continued bleeding. Lovenox dc'd and last dose was 9/10 at 1809. Pharmacy is now consulted to start apixaban. Will start maintenance dose considering treated for 4 weeks with Xarelto already. Could consider increased dose if new or worsening VTE.  Goal of Therapy:  Anticoagulation Monitor platelets by anticoagulation protocol: Yes   Plan:  Start apixaban 5mg  PO BID Monitor CBC and for increased bleeding Also monitor for signs and symptoms of new or worsened VTE    Thank you for allowing [place] to participate in this patients care.   Signe Colt, PharmD Please utilize [facility] (under [facility]) for appropriate number for your unit pharmacist. 07/12/2018 2:54 PM

## 2018-07-12 NOTE — Progress Notes (Signed)
   Subjective: She states she continues to have pain that is controlled with pain medication, but she does not want to continue having to take opioid pain medication. She endorses diffuse abdominal pain and bleeding that is similar to yesterday. She still has not had a BM but is passing gas. She denies dizziness and SOB.  Objective:  Vital signs in last 24 hours: Vitals:   07/12/18 0500 07/12/18 0503 07/12/18 0819 07/12/18 1212  BP:  (!) 101/57 114/66 (!) 89/57  Pulse:  63 72 75  Resp:  18    Temp:  98.5 F (36.9 C) (!) 97.5 F (36.4 C) 98.7 F (37.1 C)  TempSrc:  Oral Oral Oral  SpO2:  100% 99% 99%  Weight: 80.2 kg     Height:       Constitution: NAD, sitting up in bed, cooperative Cardio: rrr, no m/r/g, nml S1, S2 Respiratory: CTAB, no wheezing ralres or rhonchi Abdominal: diffusely TTP, soft, non-distended, no guarding MSK: +pulses, warm extremities, no edema, calves symmetrical and non-tender to palpation  Neuro: A&O, denies dizziness with ambulating Skin: bruising over lovenox injection site, no erythema   Assessment/Plan:  Principal Problem:   Symptomatic anemia Active Problems:   Vaginal bleeding   Acute deep vein thrombosis (DVT) of left lower extremity (HCC)   Pelvic pain  47 y/o F with several month history of menometrorrhagia and recent DVT on Xarelto who presents 5-days after endometrial biopsy with increasing pelvic pain, vaginal bleeding and symptomatic anemia despite compliance with TID Megace.   Menormetrorrhagia Acute Blood Loss Anemia CBC Stable today at 8.6 and she is asymptomatic for her anemia but endorses continued diffuse abdominal pain and does not want to leave the hospital on pain medications. She continues to have bleeding. Gyn consulted and will see her tomorrow.   - am CBC - cont. 80 mg BID megace - Gyn consulted - oxy IR 5mg  q4h prn pain, senna & miralax scheduled - cont. Iron 325mg   Left Popliteal DVT: We will restart her  anticoagulation today. Switched from prophylactic lovenox to Eliquis.    - monitor for signs of DVT or clotting  - No SCDs   Difficult Affording Medications: Patient states megace affordable but some issues with affording xarelto. Started on Eliquis today. CM consulted and stated she will need Match letter.  Diet: regular IVF: none DVT ppx: Eliquis  Dispo: Anticipated discharge in approximately 2-3 days.   Guinevere Scarlet A, DO 07/12/2018, 2:44 PM Pager: [phone]

## 2018-07-12 NOTE — Discharge Instructions (Signed)

## 2018-07-13 ENCOUNTER — Inpatient Hospital Stay (HOSPITAL_COMMUNITY): Payer: Medicaid Other

## 2018-07-13 DIAGNOSIS — D649 Anemia, unspecified: Secondary | ICD-10-CM

## 2018-07-13 DIAGNOSIS — R1011 Right upper quadrant pain: Secondary | ICD-10-CM

## 2018-07-13 LAB — CBC
HCT: 29.1 % — ABNORMAL LOW (ref 36.0–46.0)
HCT: 28.4 % — ABNORMAL LOW (ref 36.0–46.0)
HEMOGLOBIN: 8.9 g/dL — AB (ref 12.0–15.0)
Hemoglobin: 9.4 g/dL — ABNORMAL LOW (ref 12.0–15.0)
MCH: 26.2 pg (ref 26.0–34.0)
MCH: 25.9 pg — AB (ref 26.0–34.0)
MCHC: 32.3 g/dL (ref 30.0–36.0)
MCHC: 31.3 g/dL (ref 30.0–36.0)
MCV: 81.1 fL (ref 78.0–100.0)
MCV: 82.6 fL (ref 78.0–100.0)
PLATELETS: 229 10*3/uL (ref 150–400)
PLATELETS: 219 10*3/uL (ref 150–400)
RBC: 3.59 MIL/uL — ABNORMAL LOW (ref 3.87–5.11)
RBC: 3.44 MIL/uL — ABNORMAL LOW (ref 3.87–5.11)
RDW: 18.5 % — AB (ref 11.5–15.5)
RDW: 18.8 % — ABNORMAL HIGH (ref 11.5–15.5)
WBC: 7.3 10*3/uL (ref 4.0–10.5)
WBC: 7.4 10*3/uL (ref 4.0–10.5)

## 2018-07-13 MED ORDER — KETOROLAC TROMETHAMINE 15 MG/ML IJ SOLN
15.0000 mg | Freq: Once | INTRAMUSCULAR | Status: AC
  Administered 2018-07-13: 15 mg via INTRAVENOUS
  Filled 2018-07-13: qty 1

## 2018-07-13 MED ORDER — SODIUM CHLORIDE 0.9 % IV SOLN
INTRAVENOUS | Status: DC
  Administered 2018-07-13: 11:00:00 via INTRAVENOUS

## 2018-07-13 MED ORDER — ONDANSETRON HCL 4 MG PO TABS: 4.0000 mg | ORAL_TABLET | Freq: Four times a day (QID) | ORAL | 0 refills | Status: DC | PRN

## 2018-07-13 MED ORDER — TRAMADOL HCL 50 MG PO TABS
50.0000 mg | ORAL_TABLET | Freq: Four times a day (QID) | ORAL | Status: DC | PRN
  Administered 2018-07-13 (×2): 50 mg via ORAL
  Filled 2018-07-13 (×2): qty 1

## 2018-07-13 MED ORDER — SIMETHICONE 80 MG PO CHEW
80.0000 mg | CHEWABLE_TABLET | Freq: Once | ORAL | Status: AC
  Administered 2018-07-13: 80 mg via ORAL
  Filled 2018-07-13: qty 1

## 2018-07-13 MED ORDER — APIXABAN 5 MG PO TABS: 5.0000 mg | ORAL_TABLET | Freq: Two times a day (BID) | ORAL | 0 refills | Status: DC

## 2018-07-13 MED ORDER — HYDROMORPHONE HCL 1 MG/ML IJ SOLN
INTRAMUSCULAR | Status: AC
  Filled 2018-07-13: qty 1

## 2018-07-13 MED ORDER — OXYCODONE HCL 5 MG PO TABS: 5.0000 mg | ORAL_TABLET | Freq: Four times a day (QID) | ORAL | 0 refills | Status: AC | PRN

## 2018-07-13 NOTE — Discharge Summary (Signed)
Name: Kimberly Stuart MRN: [id number] DOB: Feb 22, 1971 47 y.o. PCP: Eartha Inch, MD  Date of Admission: 07/10/2018  6:02 AM Date of Discharge:  Attending Physician: Inez Catalina, MD  Discharge Diagnosis: 1. Metromenorrhagia  2. LLE DVT   Discharge Medications: Allergies as of 07/14/2018      Reactions   Estrogens    DVT      Medication List    STOP taking these medications   Rivaroxaban 15 & 20 MG Tbpk     TAKE these medications   acetaminophen 325 MG tablet Commonly known as:  TYLENOL Take 2 tablets (650 mg total) by mouth every 6 (six) hours as needed for mild pain (or Fever >/= 101).   apixaban 5 MG Tabs tablet Commonly known as:  ELIQUIS Take 1 tablet (5 mg total) by mouth 2 (two) times daily.   ferrous sulfate 325 (65 FE) MG tablet Take 325 mg by mouth daily with breakfast.   magnesium 84 MG ( ) Tbcr SR tablet Commonly known as:  MAGTAB Take 250 mg by mouth daily.   megestrol 40 MG tablet Commonly known as:  MEGACE Take 1 tablet (40 mg total) by mouth 2 (two) times daily.   multivitamin tablet Take 1 tablet by mouth daily.   ondansetron 4 MG tablet Commonly known as:  ZOFRAN Take 1 tablet (4 mg total) by mouth every 6 (six) hours as needed for nausea.   oxyCODONE 5 MG immediate release tablet Commonly known as:  Oxy IR/ROXICODONE Take 1 tablet (5 mg total) by mouth every 6 (six) hours as needed for up to 3 days for moderate pain or severe pain.       Disposition and follow-up:   KimberlyKimberly Stuart was discharged from [facility] in Stable condition.  At the hospital follow up visit please address:  1.  Metromenorrhagia: patient has had decreased bleeding since admission but continues to have bleeding. She is being discharged early to go in for ablation with [facility]. Transfused 3U of blood after admission. Hb yesterday 8.9, 8.4 this am.  - DVT: prescribed Eliquis at discharge due to difficulty affording Xarelto and she  feels it is causing side effects. Put into Match program to afford Eliquis.   2.  Labs / imaging needed at time of follow-up:  3.  Pending labs/ test needing follow-up: Hemoglobin   Follow-up Appointments: Follow-up Information    [facility] Follow up on 07/25/2018.   Why:  8:35 am with Dr. Marice Potter to discuss long term management of AUB Contact information: [address] [place] [address] [phone]          Hospital Course by problem list: Metromenorrhagia Kimberly Stuart is a 47 y/o F with several month history of menometrorrhagia and recent DVT on Xarelto who presents 5-days after endometrial biopsy with increasing pelvic pain, vaginal bleeding and symptomatic anemia despite compliance with TID Megace. Case was discussed with [facility] by ER, who recommended outpatient follow-up. On admission she was found to be acutely anemic and received a 3U RBC transfusion. Her hemoglobin remained stable afterward and throughout admission but continued downtrend slowly. Her metromenorrhagia decreased after administration of TXA and continuation of megace but did not cease. She also had continued abdominal pain that was not controlled with NSAID analgesics and opioid analgesics. Case was again discussed with Ob/gyn due to her continued bleeding and abdominal pain, and increase in megace was recommended. On 9/12 she had a severe headache that  was believed to be related to dilaudid. To rule out other etiology CT of her head was done and showed no acute abnormality. CT abdomen was also done due to diffuse abdominal pain which showed possible blood in the pelvic cavity surrounding the uterus. On 9/11 Ob/gyn was again consulted and came to discuss case with patient yesterday. They will plan for ablation this morning, and Kimberly Stuart is being discharged early this am to have her procedure. She will need to follow-up with her PCP and [facility]. She is being discharged with oxycodone 5mg  to help  with her abdominal pain as she heals.   DVT On admission she was continued on prophylactic lovenox. Once her bleeding decrease she was started on Eliquis and discharged with a prescription to continue this and follow-up with her PCP.    Discharge Vitals:   BP (!) 105/56 (BP Location: Right Arm)   Pulse 86   Temp 99.3 F (37.4 C) (Oral)   Resp 16   Ht 5' 1.5" (1.562 m)   Wt 80.2 kg   SpO2 98%   BMI 32.87 kg/m   Pertinent Labs, Studies, and Procedures:   CT 07/13/2018 Moderate amount of high density probably hemorrhagic fluid within the pelvis adjacent to the uterus  Mild retroperitoneal lymphadenopathy, left greater than right.  Left colonic diverticulosis.   By: Ted Mcalpineobrinka  Dimitrova M.D.   On: 07/13/2018 13:49  CBC Latest Ref Rng & Units 07/14/2018 07/13/2018 07/13/2018  WBC 4.0 - 10.5 K/uL 8.8 7.4 7.3  Hemoglobin 12.0 - 15.0 g/dL 0.9(W8.6(L) 8.9(L) 9.4(L)  Hematocrit 36.0 - 46.0 % 27.7(L) 28.4(L) 29.1(L)  Platelets 150 - 400 K/uL 228 219 229   CT 07/13/2018: Mucosal thickening in several ethmoid air cells. Mild nasal septal deviation. Study otherwise unremarkable.   By: Bretta BangWilliam  Woodruff III M.D.   On: 07/13/2018 13:34  Discharge Instructions: Discharge Instructions    Diet - low sodium heart healthy   Complete by:  As directed    Discharge instructions   Complete by:  As directed    Please continue the Eliquis 5mg  twice daily as previously advised for the blood clot in your leg.   With regard to the Megace, you will need to discuss this medication with your OB/GYN prior to your procedure as you may not need this or as high a dose following the procedure.   We will provide you with a short course of the Oxycodone to be taken as needed for the pain. Please fill this if needed.  Thank you [facility]. It was a pleasure treating you.   Increase activity slowly   Complete by:  As directed       Signed: Guinevere ScarletSeawell, Daxter Paule A, DO 07/14/2018, 7:47 AM    Pager: [phone]

## 2018-07-13 NOTE — Progress Notes (Addendum)
Dr. Cleaster CorinSeawell was paged by nursing staff for severe headache, and decreased responsiveness. Patient's headache began this am, did not respond to dilaudid, has not responded to Toradol. She was given a dose of tramadol but has been minimally responsive since, although answering questions appropriately.  At bedside, we completed a rapid review of her vitals with repeat BP, HR and SpO2 that was reassuring with BP 106/80's, HR 81, SpO2 100% on room air. The patient is able to provide her name, location and that she has extreme pain in the left temporal region by pointing with her left hand. Examination of her pupils revealed equally round and responsive to light at ~653mm in diameter. There is no notable neurological focal deficit, howbeit, the patient is unable to participate in a full neuro exam due to pain and decreased responsiveness which I fell is likely a result of the pain. There was no observable external vaginal hemorrhage noted on exam.   Vitals:   07/13/18 0400 07/13/18 0819  BP: (!) 81/57 (!) 110/59  Pulse: 92 81  Resp: (!) 28   Temp: 98.9 F (37.2 C) 97.7 F (36.5 C)  SpO2: 100% 98%   Plan: We will obtain a STAT CT head given her status change and use of Megace/eliquis with recent DVT and the sudden onset severe headache that has not responded to current treatment. Continue the STAT CT abdomin due to focal point tenderness and declining status. Patient placed in Stepdown with bedside monitor. Fluids to be started at 13700ml/hr normal saline. She should be closely observed until the CT's can be obtained. Recommend blood pressure automatic q10 minutes. Greatly appreciate the nursing staffs continued assistance and notification.   Please page MD STAT for any additional status changes.  Lanelle BalLawrence Toluwanimi Radebaugh, MD Internal Medicine PGY-2 Pager # [phone] Page [phone] if not response in 5min for above pager

## 2018-07-13 NOTE — Plan of Care (Signed)
After discussion with Dr. Macon LargeAnyanwu of [facility] from [facility], it appears that the patient is scheduled for an endometrial ablation at [facility] at 1300 hours on 07/14/2018. She will need to be at the hospital by 1100 hours. Plan for early discharge from [facility] on 09/13 following an early morning CBC and continued observation due to blood loss. Patient will need the Match letter prior to release.

## 2018-07-13 NOTE — Progress Notes (Addendum)
Paged by nurse that patient was tearful and in fetal position from severe pelvic pain. 5 mg of oxycodone did not relieve her pain. Evaluated the patient at bedside. She appeared comfortable with a K pad on her lower abdomen. She reported that her current pain is the same pain that originally brought her into the hospital. She also states she's been having increased vaginal bleeding since starting the Eliquis with clots the size of "grapes" (rather than the size of "fists" like when she was first admitted). Her abdomen was soft, non-distended, and tender along the lower abdomen without peritoneal signs. Ordered 0.5mg  dilaudid. Will f/u morning CBC.

## 2018-07-13 NOTE — Consult Note (Signed)
[facility] Attending Consult Note   Consult Date: 07/13/2018  Reason for Consult: Persistent abnormal uterine bleeding despite escalating doses of Megace in a patient who is on anticoagulation  Assessment/Plan: After extensive evaluation of patient and her history, the decision was made with the patient to undergo endometrial ablation tomorrow at [facility].  This is scheduled to occur around 1 pm tomorrow, patient will be NPO after midnight tonight.  She was told she needs to be at [facility] at 11 am tomorrow.  This procedure is a same day procedure, it is anticipated that patient will be discharged to home afterwards and follow up in the office as previously scheduled.  Of note, patient was offered Depo Lupron as the the next medical intervention but she declined. She is also not a good candidate for hysterectomy at this point given her anticoagulated status and high risk of bleeding.  Recent CT scan results were discussed with patient; the presence of free fluid in the peritoneum is consistent with bleeding from the uterus being extruded via the tubes.  This occurs during normal uterine bleeding, but this is exaggerated secondary to her anticoagulated status.  No additional intervention is needed for this; just need to do endometrial ablation and stop the bleeding at the source. Patient does understand that there is no intervention that will stop all the bleeding immediately. Even after surgery, some post-surgical bleeding can occur and tapers off over days/weeks.  Post-surgical expectations were discussed in detail.  Appreciate care of Kimberly Stuart by her primary team.  Please call [phone] [facility] Attending on call) for any gynecologic concerns at any time.  Thank you for taking care of this mutual patient.   Verita Schneiders, MD, [facility] Attending [place], [place]  Phone: [phone]    History of Present Illness: Kimberly Stuart is an 47 y.o. [phone] female who was admitted for symptomatic anemia due to vaginal bleeding/abnormal uterine bleeding (AUB), in the setting of being anticoagulated for recently diagnosed DVT secondary to OCPs taken for AUB.  Patient was transfused, received TXA and has been receiving escalating doses of Megace.  Her vaginal bleeding has been persistent, despite medical management, and she currently refuses all further medical management. She is very frustrated and is in significant pain due to the bleeding.  Her hemoglobin has remained stable over the last few days, but she is still anemic.  She desires an urgent endometrial ablation to help with her bleeding.   Pertinent OB/GYN History: Long history of AUB History of cervical dysplasia Benign endometrial biopsy last week  Patient Active Problem List   Diagnosis Date Noted  . Right upper quadrant abdominal pain   . Pelvic pain   . Symptomatic anemia 07/10/2018  . History of cervical dysplasia 07/10/2018  . Abnormal uterine bleeding (AUB) 06/13/2018  . Acute deep vein thrombosis (DVT) of left lower extremity ([place]) 06/13/2018  . Obesity (BMI 30.0-34.9) 06/13/2018    Past Medical History:  Diagnosis Date  . Anemia   . Vitamin D deficiency     Past Surgical History:  Procedure Laterality Date  . WISDOM TOOTH EXTRACTION      History reviewed. No pertinent family history.  Social History:  reports that she has never smoked. She has never used smokeless tobacco. She reports that she does not drink alcohol or use drugs.  Allergies:  Allergies  Allergen Reactions  . Estrogens     DVT    Medications:  I have reviewed the patient's current medications. Prior to Admission:  Medications Prior to Admission  Medication Sig Dispense Refill Last Dose  . ferrous sulfate 325 (65 FE) MG tablet Take 325 mg by mouth daily with breakfast.   Past Week at Unknown time  .  magnesium (MAGTAB) 84 MG (7MEQ) TBCR SR tablet Take 250 mg by mouth daily.    Past Week at Unknown time  . megestrol (MEGACE) 40 MG tablet Take 1 tablet (40 mg total) by mouth 2 (two) times daily. 30 tablet 3 07/09/2018 at Unknown time  . Rivaroxaban 15 & 20 MG TBPK Take 1 tablet by mouth as directed. Take as directed on package: Start with one 76m tablet by mouth twice a day with food. On Day 22, switch to one 260mtablet once a day with food. 51 each 0 07/09/2018 at am  . Multiple Vitamins-Minerals (MULTIVITAMIN) tablet Take 1 tablet by mouth daily. (Patient not taking: Reported on 07/05/2018)   Not Taking at Unknown time   Scheduled: . apixaban  5 mg Oral BID  . feeding supplement (ENSURE ENLIVE)  237 mL Oral BID BM  . ferrous sulfate  325 mg Oral Q breakfast  . megestrol  80 mg Oral BID  . polyethylene glycol  17 g Oral Daily  . senna  1 tablet Oral Daily  . simethicone  80 mg Oral Once  . sodium chloride flush  3 mL Intravenous Q12H   Continuous: . sodium chloride 100 mL/hr at 07/13/18 1100   PRIBB:[id number]*OR** acetaminophen, ondansetron **OR** ondansetron (ZOFRAN) IV, oxyCODONE, traMADol  Review of Systems: Pertinent items are noted in HPI.  Focused Physical Examination BP (!) 88/52 (BP Location: Left Arm)   Pulse 92   Temp 99.5 F (37.5 C) (Oral)   Resp (!) 28   Ht 5' 1.5" (1.562 m)   Wt 80.2 kg   SpO2 100%   BMI 32.87 kg/m  CONSTITUTIONAL: Tired appearing female in no acute distress.  HENT:  Normocephalic, atraumatic, External right and left ear normal. Oropharynx is clear and moist EYES: Conjunctivae and EOM are normal. Pupils are equal, round, and reactive to light. No scleral icterus.  NECK: Normal range of motion, supple, no masses.  Normal thyroid.  SKIN: Skin is warm and dry. No rash noted. Not diaphoretic. No erythema. No pallor. [place] and oriented to person, place, and time. Normal reflexes, muscle tone coordination. No cranial nerve deficit  noted. PSYCHIATRIC: Normal mood and affect. Normal behavior. Normal judgment and thought content. CARDIOVASCULAR: Normal heart rate noted, regular rhythm RESPIRATORY: Clear to auscultation bilaterally. Effort and breath sounds normal, no problems with respiration noted. ABDOMEN: Soft, normal bowel sounds, no distention noted. Mild lower abdominal tenderness, no rebound or guarding.  PELVIC: Deferred MUSCULOSKELETAL: Normal range of motion. No tenderness.  No cyanosis, clubbing, or edema.  2+ distal pulses.  CBC Latest Ref Rng & Units 07/13/2018 07/13/2018 07/12/2018  WBC 4.0 - 10.5 K/uL 7.4 7.3 5.3  Hemoglobin 12.0 - 15.0 g/dL 8.9(L) 9.4(L) 8.6(L)  Hematocrit 36.0 - 46.0 % 28.4(L) 29.1(L) 27.1(L)  Platelets 150 - 400 K/uL 219 229 215    Results for orders placed or performed during the hospital encounter of 07/10/18 (from the past 72 hour(s))  Prepare RBC     Status: None   Collection Time: 07/10/18 11:08 PM  Result Value Ref Range   Order Confirmation  ORDER PROCESSED BY BLOOD BANK Performed at [facility], [place] [address] [place], [place] [address]   Comprehensive metabolic panel     Status: Abnormal   Collection Time: 07/11/18  6:00 AM  Result Value Ref Range   Sodium 139 135 - 145 mmol/L   Potassium 4.3 3.5 - 5.1 mmol/L   Chloride 112 (H) 98 - 111 mmol/L   CO2 18 (L) 22 - 32 mmol/L   Glucose, Bld 86 70 - 99 mg/dL   BUN 13 6 - 20 mg/dL   Creatinine, Ser 0.80 0.44 - 1.00 mg/dL   Calcium 8.4 (L) 8.9 - 10.3 mg/dL   Total Protein 5.6 (L) 6.5 - 8.1 g/dL   Albumin 2.9 (L) 3.5 - 5.0 g/dL   AST 14 (L) 15 - 41 U/L   ALT 9 0 - 44 U/L   Alkaline Phosphatase 47 38 - 126 U/L   Total Bilirubin 0.9 0.3 - 1.2 mg/dL   GFR calc non Af Amer >60 >60 mL/min   GFR calc Af Amer >60 >60 mL/min    Comment: (NOTE) The eGFR has been calculated using the CKD EPI equation. This calculation has not been validated in all clinical situations. eGFR's persistently <60 mL/min signify possible  Chronic Kidney Disease.    Anion gap 9 5 - 15    Comment: Performed at [facility] [address], [place], [place] [address]  CBC     Status: Abnormal   Collection Time: 07/11/18  6:00 AM  Result Value Ref Range   WBC 4.0 4.0 - 10.5 K/uL   RBC 3.15 (L) 3.87 - 5.11 MIL/uL   Hemoglobin 8.2 (L) 12.0 - 15.0 g/dL   HCT 26.4 (L) 36.0 - 46.0 %   MCV 83.8 78.0 - 100.0 fL   MCH 26.0 26.0 - 34.0 pg   MCHC 31.1 30.0 - 36.0 g/dL   RDW 17.9 (H) 11.5 - 15.5 %   Platelets 174 150 - 400 K/uL    Comment: Performed at [facility], [place] [address] [place], [place] [address]  CBC     Status: Abnormal   Collection Time: 07/11/18 12:42 PM  Result Value Ref Range   WBC 4.9 4.0 - 10.5 K/uL   RBC 3.33 (L) 3.87 - 5.11 MIL/uL   Hemoglobin 8.7 (L) 12.0 - 15.0 g/dL   HCT 27.6 (L) 36.0 - 46.0 %   MCV 82.9 78.0 - 100.0 fL   MCH 26.1 26.0 - 34.0 pg   MCHC 31.5 30.0 - 36.0 g/dL   RDW 18.3 (H) 11.5 - 15.5 %   Platelets 194 150 - 400 K/uL    Comment: Performed at [facility] [address], [place], [place] [address]  Urinalysis, Routine w reflex microscopic     Status: Abnormal   Collection Time: 07/11/18  8:03 PM  Result Value Ref Range   Color, Urine YELLOW YELLOW   APPearance CLEAR CLEAR   Specific Gravity, Urine 1.009 1.005 - 1.030   pH 6.0 5.0 - 8.0   Glucose, UA NEGATIVE NEGATIVE mg/dL   Hgb urine dipstick LARGE (A) NEGATIVE   Bilirubin Urine NEGATIVE NEGATIVE   Ketones, ur NEGATIVE NEGATIVE mg/dL   Protein, ur NEGATIVE NEGATIVE mg/dL   Nitrite NEGATIVE NEGATIVE   Leukocytes, UA SMALL (A) NEGATIVE   RBC / HPF 11-20 0 - 5 RBC/hpf   WBC, UA 11-20 0 - 5 WBC/hpf   Bacteria, UA RARE (A) NONE SEEN  Mucus PRESENT     Comment: Performed at [facility], [place] [address], [place], [place] [address]  CBC     Status: Abnormal   Collection Time: 07/12/18  2:50 AM  Result Value Ref Range   WBC 5.3 4.0 - 10.5 K/uL   RBC 3.29 (L) 3.87 - 5.11 MIL/uL   Hemoglobin 8.6 (L) 12.0 -  15.0 g/dL   HCT 27.1 (L) 36.0 - 46.0 %   MCV 82.4 78.0 - 100.0 fL   MCH 26.1 26.0 - 34.0 pg   MCHC 31.7 30.0 - 36.0 g/dL   RDW 18.5 (H) 11.5 - 15.5 %   Platelets 215 150 - 400 K/uL    Comment: Performed at [facility], [place] [address] [place], [place] [address]  Basic metabolic panel     Status: Abnormal   Collection Time: 07/12/18  2:50 AM  Result Value Ref Range   Sodium 138 135 - 145 mmol/L   Potassium 3.7 3.5 - 5.1 mmol/L   Chloride 108 98 - 111 mmol/L   CO2 19 (L) 22 - 32 mmol/L   Glucose, Bld 95 70 - 99 mg/dL   BUN 12 6 - 20 mg/dL   Creatinine, Ser 0.84 0.44 - 1.00 mg/dL   Calcium 8.6 (L) 8.9 - 10.3 mg/dL   GFR calc non Af Amer >60 >60 mL/min   GFR calc Af Amer >60 >60 mL/min    Comment: (NOTE) The eGFR has been calculated using the CKD EPI equation. This calculation has not been validated in all clinical situations. eGFR's persistently <60 mL/min signify possible Chronic Kidney Disease.    Anion gap 11 5 - 15    Comment: Performed at [facility] [address], [place], [place] [address]  CBC     Status: Abnormal   Collection Time: 07/13/18  3:51 AM  Result Value Ref Range   WBC 7.3 4.0 - 10.5 K/uL   RBC 3.59 (L) 3.87 - 5.11 MIL/uL   Hemoglobin 9.4 (L) 12.0 - 15.0 g/dL   HCT 29.1 (L) 36.0 - 46.0 %   MCV 81.1 78.0 - 100.0 fL   MCH 26.2 26.0 - 34.0 pg   MCHC 32.3 30.0 - 36.0 g/dL   RDW 18.5 (H) 11.5 - 15.5 %   Platelets 229 150 - 400 K/uL    Comment: Performed at [facility], [place] [address], [place], [place] [address]  CBC     Status: Abnormal   Collection Time: 07/13/18 11:54 AM  Result Value Ref Range   WBC 7.4 4.0 - 10.5 K/uL   RBC 3.44 (L) 3.87 - 5.11 MIL/uL   Hemoglobin 8.9 (L) 12.0 - 15.0 g/dL   HCT 28.4 (L) 36.0 - 46.0 %   MCV 82.6 78.0 - 100.0 fL   MCH 25.9 (L) 26.0 - 34.0 pg   MCHC 31.3 30.0 - 36.0 g/dL   RDW 18.8 (H) 11.5 - 15.5 %   Platelets 219 150 - 400 K/uL    Comment: Performed at [facility], [place] [address]  [place], [place] [address]     Ct Abdomen Pelvis Wo Contrast  Result Date: 07/13/2018 CLINICAL DATA:  Severe right lower quadrant abdominal pain and vaginal bleeding post recent biopsy. EXAM: CT ABDOMEN AND PELVIS WITHOUT CONTRAST TECHNIQUE: Multidetector CT imaging of the abdomen and pelvis was performed following the standard protocol without IV contrast. COMPARISON:  Body CT 07/31/2016 FINDINGS: Lower chest: No acute abnormality. Hepatobiliary: No  focal liver abnormality is seen. No gallstones, gallbladder wall thickening, or biliary dilatation. Pancreas: Unremarkable. No pancreatic ductal dilatation or surrounding inflammatory changes. Spleen: Normal in size without focal abnormality. Adrenals/Urinary Tract: Adrenal glands are unremarkable. Kidneys are normal, without renal calculi, focal lesion, or hydronephrosis. Bladder is unremarkable. Stomach/Bowel: Stomach is within normal limits. Appendix appears normal. No evidence of bowel wall thickening, distention, or inflammatory changes. Scattered left colonic diverticulosis. Vascular/Lymphatic: Moderately enlarged retroperitoneal lymph nodes, left greater than right measuring up to 14 mm in short axis. Reproductive: The uterus has normal appearance. The ovaries are not identified. Moderate amount of high density likely hemorrhagic fluid surrounds the uterus. Other: No evidence of free gas within the abdomen. Musculoskeletal: No acute or significant osseous findings. IMPRESSION: Moderate amount of high density probably hemorrhagic fluid within the pelvis adjacent to the uterus Mild retroperitoneal lymphadenopathy, left greater than right. Left colonic diverticulosis. Electronically Signed   By: Fidela Salisbury M.D.   On: 07/13/2018 13:49   Ct Head Wo Contrast  Result Date: 07/13/2018 CLINICAL DATA:  Acute onset severe headache EXAM: CT HEAD WITHOUT CONTRAST TECHNIQUE: Contiguous axial images were obtained from the base of the skull through the vertex  without intravenous contrast. COMPARISON:  October 11, 2007 FINDINGS: Brain: The ventricles are normal in size and configuration. There is no intracranial mass, hemorrhage, extra-axial fluid collection, or midline shift. Gray-white compartments are normal. No evident acute infarct. Vascular: No vascular lesions are evident. No vascular calcification appreciable. Skull: Bony calvarium appears intact. Sinuses/Orbits: There is slight mucosal thickening in several ethmoid air cells. Other paranasal sinuses are clear. There is slight leftward deviation of the nasal septum. Orbits appear symmetric bilaterally. Other: Mastoid air cells are clear. IMPRESSION: Mucosal thickening in several ethmoid air cells. Mild nasal septal deviation. Study otherwise unremarkable. Electronically Signed   By: Lowella Grip III M.D.   On: 07/13/2018 13:34

## 2018-07-13 NOTE — H&P (View-Only) (Signed)
[facility] Attending Consult Note   Consult Date: 07/13/2018  Reason for Consult: Persistent abnormal uterine bleeding despite escalating doses of Megace in a patient who is on anticoagulation  Assessment/Plan: After extensive evaluation of patient and her history, the decision was made with the patient to undergo endometrial ablation tomorrow at [facility].  This is scheduled to occur around 1 pm tomorrow, patient will be NPO after midnight tonight.  She was told she needs to be at [facility] at 11 am tomorrow.  This procedure is a same day procedure, it is anticipated that patient will be discharged to home afterwards and follow up in the office as previously scheduled.  Of note, patient was offered Depo Lupron as the the next medical intervention but she declined. She is also not a good candidate for hysterectomy at this point given her anticoagulated status and high risk of bleeding.  Recent CT scan results were discussed with patient; the presence of free fluid in the peritoneum is consistent with bleeding from the uterus being extruded via the tubes.  This occurs during normal uterine bleeding, but this is exaggerated secondary to her anticoagulated status.  No additional intervention is needed for this; just need to do endometrial ablation and stop the bleeding at the source. Patient does understand that there is no intervention that will stop all the bleeding immediately. Even after surgery, some post-surgical bleeding can occur and tapers off over days/weeks.  Post-surgical expectations were discussed in detail.  Appreciate care of Kimberly Stuart by her primary team.  Please call [phone] [facility] Attending on call) for any gynecologic concerns at any time.  Thank you for taking care of this mutual patient.   Verita Schneiders, MD, [place] Attending [place], [place]  Phone: [phone]    History of Present Illness: Kimberly Stuart is an 47 y.o. [phone] female who was admitted for symptomatic anemia due to vaginal bleeding/abnormal uterine bleeding (AUB), in the setting of being anticoagulated for recently diagnosed DVT secondary to OCPs taken for AUB.  Patient was transfused, received TXA and has been receiving escalating doses of Megace.  Her vaginal bleeding has been persistent, despite medical management, and she currently refuses all further medical management. She is very frustrated and is in significant pain due to the bleeding.  Her hemoglobin has remained stable over the last few days, but she is still anemic.  She desires an urgent endometrial ablation to help with her bleeding.   Pertinent OB/GYN History: Long history of AUB History of cervical dysplasia Benign endometrial biopsy last week  Patient Active Problem List   Diagnosis Date Noted  . Right upper quadrant abdominal pain   . Pelvic pain   . Symptomatic anemia 07/10/2018  . History of cervical dysplasia 07/10/2018  . Abnormal uterine bleeding (AUB) 06/13/2018  . Acute deep vein thrombosis (DVT) of left lower extremity ([place]) 06/13/2018  . Obesity (BMI 30.0-34.9) 06/13/2018    Past Medical History:  Diagnosis Date  . Anemia   . Vitamin D deficiency     Past Surgical History:  Procedure Laterality Date  . WISDOM TOOTH EXTRACTION      History reviewed. No pertinent family history.  Social History:  reports that she has never smoked. She has never used smokeless tobacco. She reports that she does not drink alcohol or use drugs.  Allergies:  Allergies  Allergen Reactions  . Estrogens     DVT    Medications:  I have reviewed the patient's current medications. Prior to Admission:  Medications Prior to Admission  Medication Sig Dispense Refill Last Dose  . ferrous sulfate 325 (65 FE) MG tablet Take 325 mg by mouth daily with breakfast.   Past Week at Unknown time  .  magnesium (MAGTAB) 84 MG (7MEQ) TBCR SR tablet Take 250 mg by mouth daily.    Past Week at Unknown time  . megestrol (MEGACE) 40 MG tablet Take 1 tablet (40 mg total) by mouth 2 (two) times daily. 30 tablet 3 07/09/2018 at Unknown time  . Rivaroxaban 15 & 20 MG TBPK Take 1 tablet by mouth as directed. Take as directed on package: Start with one 76m tablet by mouth twice a day with food. On Day 22, switch to one 260mtablet once a day with food. 51 each 0 07/09/2018 at am  . Multiple Vitamins-Minerals (MULTIVITAMIN) tablet Take 1 tablet by mouth daily. (Patient not taking: Reported on 07/05/2018)   Not Taking at Unknown time   Scheduled: . apixaban  5 mg Oral BID  . feeding supplement (ENSURE ENLIVE)  237 mL Oral BID BM  . ferrous sulfate  325 mg Oral Q breakfast  . megestrol  80 mg Oral BID  . polyethylene glycol  17 g Oral Daily  . senna  1 tablet Oral Daily  . simethicone  80 mg Oral Once  . sodium chloride flush  3 mL Intravenous Q12H   Continuous: . sodium chloride 100 mL/hr at 07/13/18 1100   PRIBB:[id number]*OR** acetaminophen, ondansetron **OR** ondansetron (ZOFRAN) IV, oxyCODONE, traMADol  Review of Systems: Pertinent items are noted in HPI.  Focused Physical Examination BP (!) 88/52 (BP Location: Left Arm)   Pulse 92   Temp 99.5 F (37.5 C) (Oral)   Resp (!) 28   Ht 5' 1.5" (1.562 m)   Wt 80.2 kg   SpO2 100%   BMI 32.87 kg/m  CONSTITUTIONAL: Tired appearing female in no acute distress.  HENT:  Normocephalic, atraumatic, External right and left ear normal. Oropharynx is clear and moist EYES: Conjunctivae and EOM are normal. Pupils are equal, round, and reactive to light. No scleral icterus.  NECK: Normal range of motion, supple, no masses.  Normal thyroid.  SKIN: Skin is warm and dry. No rash noted. Not diaphoretic. No erythema. No pallor. N[place]Alert and oriented to person, place, and time. Normal reflexes, muscle tone coordination. No cranial nerve deficit  noted. PSYCHIATRIC: Normal mood and affect. Normal behavior. Normal judgment and thought content. CARDIOVASCULAR: Normal heart rate noted, regular rhythm RESPIRATORY: Clear to auscultation bilaterally. Effort and breath sounds normal, no problems with respiration noted. ABDOMEN: Soft, normal bowel sounds, no distention noted. Mild lower abdominal tenderness, no rebound or guarding.  PELVIC: Deferred MUSCULOSKELETAL: Normal range of motion. No tenderness.  No cyanosis, clubbing, or edema.  2+ distal pulses.  CBC Latest Ref Rng & Units 07/13/2018 07/13/2018 07/12/2018  WBC 4.0 - 10.5 K/uL 7.4 7.3 5.3  Hemoglobin 12.0 - 15.0 g/dL 8.9(L) 9.4(L) 8.6(L)  Hematocrit 36.0 - 46.0 % 28.4(L) 29.1(L) 27.1(L)  Platelets 150 - 400 K/uL 219 229 215    Results for orders placed or performed during the hospital encounter of 07/10/18 (from the past 72 hour(s))  Prepare RBC     Status: None   Collection Time: 07/10/18 11:08 PM  Result Value Ref Range   Order Confirmation  ORDER PROCESSED BY BLOOD BANK Performed at [facility], [place] [address] [place], [place] [address]   Comprehensive metabolic panel     Status: Abnormal   Collection Time: 07/11/18  6:00 AM  Result Value Ref Range   Sodium 139 135 - 145 mmol/L   Potassium 4.3 3.5 - 5.1 mmol/L   Chloride 112 (H) 98 - 111 mmol/L   CO2 18 (L) 22 - 32 mmol/L   Glucose, Bld 86 70 - 99 mg/dL   BUN 13 6 - 20 mg/dL   Creatinine, Ser 0.80 0.44 - 1.00 mg/dL   Calcium 8.4 (L) 8.9 - 10.3 mg/dL   Total Protein 5.6 (L) 6.5 - 8.1 g/dL   Albumin 2.9 (L) 3.5 - 5.0 g/dL   AST 14 (L) 15 - 41 U/L   ALT 9 0 - 44 U/L   Alkaline Phosphatase 47 38 - 126 U/L   Total Bilirubin 0.9 0.3 - 1.2 mg/dL   GFR calc non Af Amer >60 >60 mL/min   GFR calc Af Amer >60 >60 mL/min    Comment: (NOTE) The eGFR has been calculated using the CKD EPI equation. This calculation has not been validated in all clinical situations. eGFR's persistently <60 mL/min signify possible  Chronic Kidney Disease.    Anion gap 9 5 - 15    Comment: Performed at [facility] [address], [place], [place] [address]  CBC     Status: Abnormal   Collection Time: 07/11/18  6:00 AM  Result Value Ref Range   WBC 4.0 4.0 - 10.5 K/uL   RBC 3.15 (L) 3.87 - 5.11 MIL/uL   Hemoglobin 8.2 (L) 12.0 - 15.0 g/dL   HCT 26.4 (L) 36.0 - 46.0 %   MCV 83.8 78.0 - 100.0 fL   MCH 26.0 26.0 - 34.0 pg   MCHC 31.1 30.0 - 36.0 g/dL   RDW 17.9 (H) 11.5 - 15.5 %   Platelets 174 150 - 400 K/uL    Comment: Performed at [facility], [place] [address] [place], [place] [address]  CBC     Status: Abnormal   Collection Time: 07/11/18 12:42 PM  Result Value Ref Range   WBC 4.9 4.0 - 10.5 K/uL   RBC 3.33 (L) 3.87 - 5.11 MIL/uL   Hemoglobin 8.7 (L) 12.0 - 15.0 g/dL   HCT 27.6 (L) 36.0 - 46.0 %   MCV 82.9 78.0 - 100.0 fL   MCH 26.1 26.0 - 34.0 pg   MCHC 31.5 30.0 - 36.0 g/dL   RDW 18.3 (H) 11.5 - 15.5 %   Platelets 194 150 - 400 K/uL    Comment: Performed at [facility] [address], [place], [place] [address]  Urinalysis, Routine w reflex microscopic     Status: Abnormal   Collection Time: 07/11/18  8:03 PM  Result Value Ref Range   Color, Urine YELLOW YELLOW   APPearance CLEAR CLEAR   Specific Gravity, Urine 1.009 1.005 - 1.030   pH 6.0 5.0 - 8.0   Glucose, UA NEGATIVE NEGATIVE mg/dL   Hgb urine dipstick LARGE (A) NEGATIVE   Bilirubin Urine NEGATIVE NEGATIVE   Ketones, ur NEGATIVE NEGATIVE mg/dL   Protein, ur NEGATIVE NEGATIVE mg/dL   Nitrite NEGATIVE NEGATIVE   Leukocytes, UA SMALL (A) NEGATIVE   RBC / HPF 11-20 0 - 5 RBC/hpf   WBC, UA 11-20 0 - 5 WBC/hpf   Bacteria, UA RARE (A) NONE SEEN  Mucus PRESENT     Comment: Performed at [facility], [place] [address], [place], [place] [address]  CBC     Status: Abnormal   Collection Time: 07/12/18  2:50 AM  Result Value Ref Range   WBC 5.3 4.0 - 10.5 K/uL   RBC 3.29 (L) 3.87 - 5.11 MIL/uL   Hemoglobin 8.6 (L) 12.0 -  15.0 g/dL   HCT 27.1 (L) 36.0 - 46.0 %   MCV 82.4 78.0 - 100.0 fL   MCH 26.1 26.0 - 34.0 pg   MCHC 31.7 30.0 - 36.0 g/dL   RDW 18.5 (H) 11.5 - 15.5 %   Platelets 215 150 - 400 K/uL    Comment: Performed at [facility], [place] [address] [place], [place] [address]  Basic metabolic panel     Status: Abnormal   Collection Time: 07/12/18  2:50 AM  Result Value Ref Range   Sodium 138 135 - 145 mmol/L   Potassium 3.7 3.5 - 5.1 mmol/L   Chloride 108 98 - 111 mmol/L   CO2 19 (L) 22 - 32 mmol/L   Glucose, Bld 95 70 - 99 mg/dL   BUN 12 6 - 20 mg/dL   Creatinine, Ser 0.84 0.44 - 1.00 mg/dL   Calcium 8.6 (L) 8.9 - 10.3 mg/dL   GFR calc non Af Amer >60 >60 mL/min   GFR calc Af Amer >60 >60 mL/min    Comment: (NOTE) The eGFR has been calculated using the CKD EPI equation. This calculation has not been validated in all clinical situations. eGFR's persistently <60 mL/min signify possible Chronic Kidney Disease.    Anion gap 11 5 - 15    Comment: Performed at [facility] [address], [place], [place] [address]  CBC     Status: Abnormal   Collection Time: 07/13/18  3:51 AM  Result Value Ref Range   WBC 7.3 4.0 - 10.5 K/uL   RBC 3.59 (L) 3.87 - 5.11 MIL/uL   Hemoglobin 9.4 (L) 12.0 - 15.0 g/dL   HCT 29.1 (L) 36.0 - 46.0 %   MCV 81.1 78.0 - 100.0 fL   MCH 26.2 26.0 - 34.0 pg   MCHC 32.3 30.0 - 36.0 g/dL   RDW 18.5 (H) 11.5 - 15.5 %   Platelets 229 150 - 400 K/uL    Comment: Performed at [facility], [place] [address], [place], [place] [address]  CBC     Status: Abnormal   Collection Time: 07/13/18 11:54 AM  Result Value Ref Range   WBC 7.4 4.0 - 10.5 K/uL   RBC 3.44 (L) 3.87 - 5.11 MIL/uL   Hemoglobin 8.9 (L) 12.0 - 15.0 g/dL   HCT 28.4 (L) 36.0 - 46.0 %   MCV 82.6 78.0 - 100.0 fL   MCH 25.9 (L) 26.0 - 34.0 pg   MCHC 31.3 30.0 - 36.0 g/dL   RDW 18.8 (H) 11.5 - 15.5 %   Platelets 219 150 - 400 K/uL    Comment: Performed at [facility], [place] [address]  [place], [place] [address]     Ct Abdomen Pelvis Wo Contrast  Result Date: 07/13/2018 CLINICAL DATA:  Severe right lower quadrant abdominal pain and vaginal bleeding post recent biopsy. EXAM: CT ABDOMEN AND PELVIS WITHOUT CONTRAST TECHNIQUE: Multidetector CT imaging of the abdomen and pelvis was performed following the standard protocol without IV contrast. COMPARISON:  Body CT 07/31/2016 FINDINGS: Lower chest: No acute abnormality. Hepatobiliary: No  focal liver abnormality is seen. No gallstones, gallbladder wall thickening, or biliary dilatation. Pancreas: Unremarkable. No pancreatic ductal dilatation or surrounding inflammatory changes. Spleen: Normal in size without focal abnormality. Adrenals/Urinary Tract: Adrenal glands are unremarkable. Kidneys are normal, without renal calculi, focal lesion, or hydronephrosis. Bladder is unremarkable. Stomach/Bowel: Stomach is within normal limits. Appendix appears normal. No evidence of bowel wall thickening, distention, or inflammatory changes. Scattered left colonic diverticulosis. Vascular/Lymphatic: Moderately enlarged retroperitoneal lymph nodes, left greater than right measuring up to 14 mm in short axis. Reproductive: The uterus has normal appearance. The ovaries are not identified. Moderate amount of high density likely hemorrhagic fluid surrounds the uterus. Other: No evidence of free gas within the abdomen. Musculoskeletal: No acute or significant osseous findings. IMPRESSION: Moderate amount of high density probably hemorrhagic fluid within the pelvis adjacent to the uterus Mild retroperitoneal lymphadenopathy, left greater than right. Left colonic diverticulosis. Electronically Signed   By: Fidela Salisbury M.D.   On: 07/13/2018 13:49   Ct Head Wo Contrast  Result Date: 07/13/2018 CLINICAL DATA:  Acute onset severe headache EXAM: CT HEAD WITHOUT CONTRAST TECHNIQUE: Contiguous axial images were obtained from the base of the skull through the vertex  without intravenous contrast. COMPARISON:  October 11, 2007 FINDINGS: Brain: The ventricles are normal in size and configuration. There is no intracranial mass, hemorrhage, extra-axial fluid collection, or midline shift. Gray-white compartments are normal. No evident acute infarct. Vascular: No vascular lesions are evident. No vascular calcification appreciable. Skull: Bony calvarium appears intact. Sinuses/Orbits: There is slight mucosal thickening in several ethmoid air cells. Other paranasal sinuses are clear. There is slight leftward deviation of the nasal septum. Orbits appear symmetric bilaterally. Other: Mastoid air cells are clear. IMPRESSION: Mucosal thickening in several ethmoid air cells. Mild nasal septal deviation. Study otherwise unremarkable. Electronically Signed   By: Lowella Grip III M.D.   On: 07/13/2018 13:34

## 2018-07-13 NOTE — Anesthesia Preprocedure Evaluation (Deleted)
Anesthesia Evaluation    Reviewed: Allergy & Precautions, Patient's Chart, lab work & pertinent test results  Airway        Dental   Pulmonary           Cardiovascular + Peripheral Vascular Disease and + DVT       Neuro/Psych    GI/Hepatic   Endo/Other    Renal/GU      Musculoskeletal   Abdominal   Peds  Hematology  (+) anemia ,   Anesthesia Other Findings Spoke with Dr Debroah LoopArnold regarding patient status.  She is s/p DVT and was on anticoagulant with possible 9/12 CT findings suggestive of  Moderate amount of high density probably hemorrhagic fluid within the pelvis adjacent to the uterus Mild retroperitoneal lymphadenopathy, left greater than right.  Reproductive/Obstetrics                                                             Anesthesia Evaluation  Patient identified by MRN, date of birth, ID band Patient awake    Reviewed: Allergy & Precautions, NPO status , Patient's Chart, lab work & pertinent test results  Airway Mallampati: II  TM Distance: >3 FB Neck ROM: Full    Dental no notable dental hx. (+) Teeth Intact, Dental Advisory Given   Pulmonary    Pulmonary exam normal breath sounds clear to auscultation       Cardiovascular + DVT (on eliquis)  Normal cardiovascular exam Rhythm:Regular Rate:Normal     Neuro/Psych negative neurological ROS  negative psych ROS   GI/Hepatic negative GI ROS, Neg liver ROS,   Endo/Other  negative endocrine ROS  Renal/GU negative Renal ROS  negative genitourinary   Musculoskeletal negative musculoskeletal ROS (+)   Abdominal   Peds  Hematology negative hematology ROS (+) anemia ,   Anesthesia Other Findings Vaginal bleeding  Reproductive/Obstetrics                            Anesthesia Physical Anesthesia Plan  ASA: III  Anesthesia Plan: General   Post-op Pain Management:     Induction: Intravenous  PONV Risk Score and Plan: 3 and Dexamethasone, Ondansetron and Midazolam  Airway Management Planned: LMA  Additional Equipment:   Intra-op Plan:   Post-operative Plan: Extubation in OR  Informed Consent: I have reviewed the patients History and Physical, chart, labs and discussed the procedure including the risks, benefits and alternatives for the proposed anesthesia with the patient or authorized representative who has indicated his/her understanding and acceptance.   Dental advisory given  Plan Discussed with: CRNA  Anesthesia Plan Comments:         Anesthesia Quick Evaluation  Anesthesia Physical Anesthesia Plan  ASA: III  Anesthesia Plan: General   Post-op Pain Management:    Induction: Intravenous  PONV Risk Score and Plan: 4 or greater and Ondansetron, Dexamethasone, Scopolamine patch - Pre-op and Midazolam  Airway Management Planned: Oral ETT  Additional Equipment:   Intra-op Plan:   Post-operative Plan: Extubation in OR  Informed Consent:   Plan Discussed with: Anesthesiologist, Surgeon and CRNA  Anesthesia Plan Comments: (  )       Anesthesia Quick Evaluation

## 2018-07-13 NOTE — Progress Notes (Addendum)
   Subjective: Patient states she has had increased pain overnight in her abdomen and that "she will not leave the hospital with pain medication." She describes the pain as diffuse and everywhere. She endorses dizziness on standing. She had BM overnight. She denies nausea.  Additional history discussed. She states a year and a half ago she was 8 months of bleeding that resolved on its own. Prior to this she would have pain and bleeding every time she had sex.    Objective:  Vital signs in last 24 hours: Vitals:   07/12/18 1646 07/12/18 1942 07/12/18 2343 07/13/18 0400  BP: 111/70 111/65 116/69 (!) 81/57  Pulse: 75 68  92  Resp:  14  (!) 28  Temp: 98.8 F (37.1 C)  98.5 F (36.9 C) 98.9 F (37.2 C)  TempSrc: Oral  Oral Oral  SpO2: 100% 98%  100%  Weight:      Height:       Constitution:NAD, sitting up in bed, cooperative Cardio:rrr, no m/r/g, nml S1, S2 Respiratory:CTAB, no wheezing ralres or rhonchi Abdominal:diffusely TTP, increased TTP RLQ, voluntary guarding, soft, non-distended, MSK:+pulses, warm extremities, no edema, calves symmetrical and non-tender to palpation  Neuro:A&O, denies dizziness with ambulating Skin:bruising over lovenox injection site, no erythema  Assessment/Plan:  Principal Problem:   Symptomatic anemia Active Problems:   Vaginal bleeding   Acute deep vein thrombosis (DVT) of left lower extremity (HCC)   Pelvic pain  47 y/o F with several month history of menometrorrhagia and recent DVT on Xarelto who presents 5-days after endometrial biopsy with increasing pelvic pain, vaginal bleeding and symptomatic anemia despite compliance with TID Megace.   Menormetrorrhagia Acute Blood Loss Anemia Night team paged overnight as patient having intense abdominal pain not controlled by her oxy, given dilaudid which she states gave her a HA. She has had some increased bleeding overnight. Abdominal pain is diffuse but localizes to RLQ today. She also endorses  history of abnormal bleeding with sex.   - CT scan    - trend CBC - cont. 80 mg BID megace - Gyn consulted, stated they would come see her today.  - IV toradol once oxy IR 5mg  q4h prn pain - cont. Iron 325mg  - factor assay   Left Popliteal ZOX:WRUEAVT:Began eliquis yesterday. Patient endorse minor increase in menorrhagia.   - cont. eliquis   - monitor for signs of DVT or clotting  - No SCDs   Difficult Affording Medications:Eliquis at discharge  Diet:regular VWU:JWJXVF:none DVT BJY:NWGNFAOppx:Eliquis  Dispo: Anticipated discharge in approximately .   Guinevere ScarletSeawell, Jaimie A, DO 07/13/2018, 6:58 AM Pager: [phone]

## 2018-07-14 ENCOUNTER — Ambulatory Visit (HOSPITAL_COMMUNITY): Payer: Medicaid Other | Admitting: Certified Registered Nurse Anesthetist

## 2018-07-14 ENCOUNTER — Ambulatory Visit (HOSPITAL_COMMUNITY)
Admission: AD | Admit: 2018-07-14 | Discharge: 2018-07-14 | Disposition: A | Discharge status: Home / Self Care | Payer: Medicaid Other | Source: Ambulatory Visit | Attending: Obstetrics & Gynecology | Admitting: Obstetrics & Gynecology

## 2018-07-14 ENCOUNTER — Ambulatory Visit: Admit: 2018-07-14 | Payer: MEDICAID | Admitting: Obstetrics & Gynecology

## 2018-07-14 ENCOUNTER — Other Ambulatory Visit: Payer: Self-pay

## 2018-07-14 ENCOUNTER — Encounter (HOSPITAL_COMMUNITY)
Admission: AD | Disposition: A | Discharge status: Home / Self Care | Payer: Self-pay | Source: Ambulatory Visit | Attending: Obstetrics & Gynecology

## 2018-07-14 DIAGNOSIS — D649 Anemia, unspecified: Secondary | ICD-10-CM | POA: Insufficient documentation

## 2018-07-14 DIAGNOSIS — Z888 Allergy status to other drugs, medicaments and biological substances status: Secondary | ICD-10-CM

## 2018-07-14 DIAGNOSIS — N939 Abnormal uterine and vaginal bleeding, unspecified: Secondary | ICD-10-CM | POA: Insufficient documentation

## 2018-07-14 DIAGNOSIS — Z86718 Personal history of other venous thrombosis and embolism: Secondary | ICD-10-CM | POA: Insufficient documentation

## 2018-07-14 DIAGNOSIS — Z6832 Body mass index (BMI) 32.0-32.9, adult: Secondary | ICD-10-CM | POA: Insufficient documentation

## 2018-07-14 DIAGNOSIS — Z7901 Long term (current) use of anticoagulants: Secondary | ICD-10-CM | POA: Insufficient documentation

## 2018-07-14 DIAGNOSIS — E669 Obesity, unspecified: Secondary | ICD-10-CM | POA: Diagnosis not present

## 2018-07-14 DIAGNOSIS — Z599 Problem related to housing and economic circumstances, unspecified: Secondary | ICD-10-CM

## 2018-07-14 DIAGNOSIS — Z79899 Other long term (current) drug therapy: Secondary | ICD-10-CM | POA: Insufficient documentation

## 2018-07-14 HISTORY — PX: HYSTEROSCOPY: SHX211

## 2018-07-14 HISTORY — PX: DILITATION & CURRETTAGE/HYSTROSCOPY WITH NOVASURE ABLATION: SHX5568

## 2018-07-14 LAB — CBC WITH DIFFERENTIAL/PLATELET
ABS IMMATURE GRANULOCYTES: 0.1 10*3/uL (ref 0.0–0.1)
Basophils Absolute: 0 10*3/uL (ref 0.0–0.1)
Basophils Relative: 0 %
EOS ABS: 0.2 10*3/uL (ref 0.0–0.7)
Eosinophils Relative: 2 %
HEMATOCRIT: 27.7 % — AB (ref 36.0–46.0)
Hemoglobin: 8.6 g/dL — ABNORMAL LOW (ref 12.0–15.0)
IMMATURE GRANULOCYTES: 1 %
LYMPHS ABS: 1 10*3/uL (ref 0.7–4.0)
Lymphocytes Relative: 11 %
MCH: 26.1 pg (ref 26.0–34.0)
MCHC: 31 g/dL (ref 30.0–36.0)
MCV: 83.9 fL (ref 78.0–100.0)
MONOS PCT: 9 %
Monocytes Absolute: 0.8 10*3/uL (ref 0.1–1.0)
NEUTROS ABS: 6.7 10*3/uL (ref 1.7–7.7)
NEUTROS PCT: 77 %
Platelets: 228 10*3/uL (ref 150–400)
RBC: 3.3 MIL/uL — ABNORMAL LOW (ref 3.87–5.11)
RDW: 19 % — ABNORMAL HIGH (ref 11.5–15.5)
WBC: 8.8 10*3/uL (ref 4.0–10.5)

## 2018-07-14 LAB — FACTOR 7 ASSAY: Factor VII Activity: 69 % (ref 51–186)

## 2018-07-14 LAB — FACTOR 10 ASSAY: FACTOR X ACTIVITY: 115 % (ref 76–183)

## 2018-07-14 LAB — ABO/RH: ABO/RH(D): A POS

## 2018-07-14 LAB — PREPARE RBC (CROSSMATCH)

## 2018-07-14 LAB — APTT: APTT: 35 s (ref 24–36)

## 2018-07-14 LAB — PROTIME-INR
INR: 1.52
PROTHROMBIN TIME: 18.2 s — AB (ref 11.4–15.2)

## 2018-07-14 LAB — VON WILLEBRAND ANTIGEN: Von Willebrand Antigen, Plasma: 201 % — ABNORMAL HIGH (ref 50–200)

## 2018-07-14 LAB — FACTOR 5 ASSAY: FACTOR V ACTIVITY: 97 % (ref 70–150)

## 2018-07-14 LAB — FACTOR 2 ASSAY: Factor II Activity: 122 % (ref 50–154)

## 2018-07-14 SURGERY — DILATATION & CURETTAGE/HYSTEROSCOPY WITH NOVASURE ABLATION
Anesthesia: General

## 2018-07-14 MED ORDER — ONDANSETRON HCL 4 MG/2ML IJ SOLN
INTRAMUSCULAR | Status: DC | PRN
  Administered 2018-07-14: 4 mg via INTRAVENOUS

## 2018-07-14 MED ORDER — MEGESTROL ACETATE 40 MG PO TABS: 80.0000 mg | ORAL_TABLET | Freq: Two times a day (BID) | ORAL | 3 refills | Status: DC

## 2018-07-14 MED ORDER — OXYCODONE HCL 5 MG PO TABS
ORAL_TABLET | ORAL | Status: AC
  Filled 2018-07-14: qty 1

## 2018-07-14 MED ORDER — ONDANSETRON HCL 4 MG/2ML IJ SOLN
INTRAMUSCULAR | Status: AC
  Filled 2018-07-14: qty 2

## 2018-07-14 MED ORDER — SILVER NITRATE-POT NITRATE 75-25 % EX MISC
CUTANEOUS | Status: AC
  Filled 2018-07-14: qty 1

## 2018-07-14 MED ORDER — FENTANYL CITRATE (PF) 100 MCG/2ML IJ SOLN
INTRAMUSCULAR | Status: AC
  Filled 2018-07-14: qty 2

## 2018-07-14 MED ORDER — MIDAZOLAM HCL 2 MG/2ML IJ SOLN
INTRAMUSCULAR | Status: AC
  Filled 2018-07-14: qty 2

## 2018-07-14 MED ORDER — BUPIVACAINE HCL 0.5 % IJ SOLN
INTRAMUSCULAR | Status: DC | PRN
  Administered 2018-07-14: 30 mL

## 2018-07-14 MED ORDER — SIMETHICONE 80 MG PO CHEW: 160.0000 mg | CHEWABLE_TABLET | Freq: Four times a day (QID) | ORAL | 2 refills | Status: DC | PRN

## 2018-07-14 MED ORDER — SCOPOLAMINE 1 MG/3DAYS TD PT72
1.0000 | MEDICATED_PATCH | Freq: Once | TRANSDERMAL | Status: DC
  Administered 2018-07-14: 1.5 mg via TRANSDERMAL

## 2018-07-14 MED ORDER — BUPIVACAINE HCL (PF) 0.5 % IJ SOLN
INTRAMUSCULAR | Status: AC
  Filled 2018-07-14: qty 30

## 2018-07-14 MED ORDER — SODIUM CHLORIDE 0.9 % IR SOLN
Status: DC | PRN
  Administered 2018-07-14: 3000 mL

## 2018-07-14 MED ORDER — ACETAMINOPHEN 325 MG PO TABS: 650.0000 mg | ORAL_TABLET | Freq: Four times a day (QID) | ORAL | 0 refills | Status: DC | PRN

## 2018-07-14 MED ORDER — DEXAMETHASONE SODIUM PHOSPHATE 10 MG/ML IJ SOLN
INTRAMUSCULAR | Status: DC | PRN
  Administered 2018-07-14: 4 mg via INTRAVENOUS

## 2018-07-14 MED ORDER — LIDOCAINE HCL (CARDIAC) PF 100 MG/5ML IV SOSY
PREFILLED_SYRINGE | INTRAVENOUS | Status: DC | PRN
  Administered 2018-07-14: 100 mg via INTRAVENOUS

## 2018-07-14 MED ORDER — FERROUS SULFATE 325 (65 FE) MG PO TABS: 325.0000 mg | ORAL_TABLET | Freq: Two times a day (BID) | ORAL | 3 refills | Status: DC

## 2018-07-14 MED ORDER — OXYCODONE HCL 5 MG PO TABS
5.0000 mg | ORAL_TABLET | Freq: Once | ORAL | Status: AC
  Administered 2018-07-14: 5 mg via ORAL

## 2018-07-14 MED ORDER — LACTATED RINGERS IV SOLN
INTRAVENOUS | Status: DC
  Administered 2018-07-14: 100 mL/h via INTRAVENOUS
  Administered 2018-07-14: 14:00:00 via INTRAVENOUS

## 2018-07-14 MED ORDER — MIDAZOLAM HCL 2 MG/2ML IJ SOLN
INTRAMUSCULAR | Status: DC | PRN
  Administered 2018-07-14: 2 mg via INTRAVENOUS

## 2018-07-14 MED ORDER — FENTANYL CITRATE (PF) 100 MCG/2ML IJ SOLN
INTRAMUSCULAR | Status: DC | PRN
  Administered 2018-07-14 (×2): 25 ug via INTRAVENOUS
  Administered 2018-07-14: 50 ug via INTRAVENOUS
  Administered 2018-07-14: 25 ug via INTRAVENOUS

## 2018-07-14 MED ORDER — FENTANYL CITRATE (PF) 100 MCG/2ML IJ SOLN
25.0000 ug | INTRAMUSCULAR | Status: DC | PRN
  Administered 2018-07-14 (×3): 50 ug via INTRAVENOUS

## 2018-07-14 MED ORDER — CHLOROPROCAINE HCL 1 % IJ SOLN
INTRAMUSCULAR | Status: AC
  Filled 2018-07-14: qty 30

## 2018-07-14 MED ORDER — IBUPROFEN 600 MG PO TABS: 600.0000 mg | ORAL_TABLET | Freq: Four times a day (QID) | ORAL | 2 refills | Status: DC | PRN

## 2018-07-14 MED ORDER — DOCUSATE SODIUM 100 MG PO CAPS: 100.0000 mg | ORAL_CAPSULE | Freq: Two times a day (BID) | ORAL | 2 refills | Status: DC | PRN

## 2018-07-14 MED ORDER — ACETAMINOPHEN 10 MG/ML IV SOLN
1000.0000 mg | Freq: Once | INTRAVENOUS | Status: DC | PRN
  Administered 2018-07-14: 1000 mg via INTRAVENOUS

## 2018-07-14 MED ORDER — DEXAMETHASONE SODIUM PHOSPHATE 4 MG/ML IJ SOLN
INTRAMUSCULAR | Status: AC
  Filled 2018-07-14: qty 1

## 2018-07-14 MED ORDER — ACETAMINOPHEN 10 MG/ML IV SOLN
INTRAVENOUS | Status: AC
  Administered 2018-07-14: 1000 mg via INTRAVENOUS
  Filled 2018-07-14: qty 100

## 2018-07-14 MED ORDER — LIDOCAINE HCL (CARDIAC) PF 100 MG/5ML IV SOSY
PREFILLED_SYRINGE | INTRAVENOUS | Status: AC
  Filled 2018-07-14: qty 5

## 2018-07-14 MED ORDER — SCOPOLAMINE 1 MG/3DAYS TD PT72
MEDICATED_PATCH | TRANSDERMAL | Status: AC
  Filled 2018-07-14: qty 1

## 2018-07-14 MED ORDER — PROPOFOL 10 MG/ML IV BOLUS
INTRAVENOUS | Status: AC
  Filled 2018-07-14: qty 20

## 2018-07-14 MED ORDER — SILVER NITRATE-POT NITRATE 75-25 % EX MISC
CUTANEOUS | Status: DC | PRN
  Administered 2018-07-14: 2 via TOPICAL

## 2018-07-14 MED ORDER — TRAMADOL HCL 50 MG PO TABS: 50.0000 mg | ORAL_TABLET | Freq: Four times a day (QID) | ORAL | 0 refills | Status: DC | PRN

## 2018-07-14 MED ORDER — PROPOFOL 10 MG/ML IV BOLUS
INTRAVENOUS | Status: DC | PRN
  Administered 2018-07-14: 200 mg via INTRAVENOUS

## 2018-07-14 MED ORDER — FENTANYL CITRATE (PF) 100 MCG/2ML IJ SOLN
INTRAMUSCULAR | Status: AC
  Administered 2018-07-14: 50 ug via INTRAVENOUS
  Filled 2018-07-14: qty 2

## 2018-07-14 MED ORDER — KETOROLAC TROMETHAMINE 30 MG/ML IJ SOLN
INTRAMUSCULAR | Status: AC
  Filled 2018-07-14: qty 1

## 2018-07-14 SURGICAL SUPPLY — 13 items
ABLATOR SURESOUND NOVASURE (ABLATOR) ×3 IMPLANT
CANISTER SUCT 3000ML PPV (MISCELLANEOUS) ×3 IMPLANT
CATH ROBINSON RED A/P 16FR (CATHETERS) ×3 IMPLANT
GAUZE SPONGE 4X4 16PLY XRAY LF (GAUZE/BANDAGES/DRESSINGS) ×2 IMPLANT
GLOVE BIOGEL PI IND STRL 7.0 (GLOVE) ×3 IMPLANT
GLOVE BIOGEL PI INDICATOR 7.0 (GLOVE) ×6
GLOVE ECLIPSE 7.0 STRL STRAW (GLOVE) ×3 IMPLANT
GOWN STRL REUS W/TWL LRG LVL3 (GOWN DISPOSABLE) ×6 IMPLANT
PACK VAGINAL MINOR WOMEN LF (CUSTOM PROCEDURE TRAY) ×3 IMPLANT
PAD OB MATERNITY 4.3X12.25 (PERSONAL CARE ITEMS) ×3 IMPLANT
PIPET BIOPSY ENDOMETRIAL 3MM (SUCTIONS) ×2 IMPLANT
SET GENESYS HTA PROCERVA (MISCELLANEOUS) ×2 IMPLANT
TOWEL OR 17X24 6PK STRL BLUE (TOWEL DISPOSABLE) ×6 IMPLANT

## 2018-07-14 NOTE — Anesthesia Procedure Notes (Signed)
Procedure Name: LMA Insertion Date/Time: 07/14/2018 1:18 PM Performed by: Elgie CongoMalinova, Sarahjane Matherly H, CRNA Pre-anesthesia Checklist: Patient identified, Emergency Drugs available, Suction available and Patient being monitored Patient Re-evaluated:Patient Re-evaluated prior to induction Oxygen Delivery Method: Circle system utilized Preoxygenation: Pre-oxygenation with 100% oxygen Induction Type: IV induction Ventilation: Mask ventilation without difficulty LMA: LMA inserted LMA Size: 4.0 Number of attempts: 1 Placement Confirmation: positive ETCO2 and breath sounds checked- equal and bilateral Tube secured with: Tape Dental Injury: Teeth and Oropharynx as per pre-operative assessment

## 2018-07-14 NOTE — Progress Notes (Signed)
  Date: 07/14/2018  Patient name: Kimberly HedgeMildred A Odonoghue  Medical record number: [id number]  Date of birth: 09/21/1971   I have seen and evaluated this patient and I have discussed the plan of care with the house staff. Please see Dr. Al DecantSeawell's note for complete details. I concur with her findings.  Inez CatalinaMullen, Emily B, MD 07/14/2018, 9:27 PM

## 2018-07-14 NOTE — Interval H&P Note (Signed)
History and Physical Interval Note 07/14/2018 12:17 PM  Kimberly Stuart  has presented today for surgery with the diagnosis of abnormal uterine bleeding.  The various methods of treatment have been discussed with the patient and family. After consideration of risks, benefits and other options for treatment, the patient has [facility]  as a surgical intervention .  The patient's history has been reviewed, patient examined, no change in status, stable for surgery.  I have reviewed the patient's chart and labs.  Questions were answered to the patient's satisfaction.  To OR when ready.    Jaynie CollinsUGONNA  ANYANWU, MD, FACOG Obstetrician & Gynecologist, [facility], [facility]

## 2018-07-14 NOTE — Anesthesia Postprocedure Evaluation (Signed)
Anesthesia Post Note  Patient: Kimberly Stuart  Procedure(s) Performed: Attempted Novasure Ablation (N/A ) HYSTEROSCOPY WITH HYDROTHERMAL ABLATION (N/A )     Patient location during evaluation: PACU Anesthesia Type: General Level of consciousness: awake and alert Pain management: pain level controlled Vital Signs Assessment: post-procedure vital signs reviewed and stable Respiratory status: spontaneous breathing, nonlabored ventilation, respiratory function stable and patient connected to nasal cannula oxygen Cardiovascular status: blood pressure returned to baseline and stable Postop Assessment: no apparent nausea or vomiting Anesthetic complications: no    Last Vitals:  Vitals:   07/14/18 1645 07/14/18 1700  BP: 100/61   Pulse: 72 71  Resp: 12 14  Temp:  36.9 C  SpO2: 97% 96%    Last Pain:  Vitals:   07/14/18 1700  TempSrc:   PainSc: 4    Pain Goal: Patients Stated Pain Goal: 4 (07/14/18 1700)               Saryn Cherry L Ree Alcalde

## 2018-07-14 NOTE — Op Note (Signed)
PREOPERATIVE DIAGNOSIS:  Abnormal uterine bleeding, anticoagulated state; failed progestin therapy POSTOPERATIVE DIAGNOSIS: The same PROCEDURE: Hysteroscopy, Hydrothermal Endometrial Ablation (after failed Novasure ablation attempt) SURGEON:  Dr. Jaynie Stuart Cynthis Stuart  INDICATIONS: 47 y.o. Kimberly Stuart here for scheduled surgery for abnormal uterine bleeding. Risks of surgery were discussed with the patient including but not limited to: bleeding; infection which may require antibiotics; injury to uterus leading to risk of injury to surrounding intraperitoneal organs, burn injury to vagina or other organs, need for additional procedures including laparoscopy or laparotomy, inability to complete ablation due to uterine or mechanical anomaly, and other postoperative/anesthesia complications.  Patient was informed that there is a high likelihood of success of controlling her symptoms; however about 5% of patients may require further intervention.  Written informed consent was obtained.    FINDINGS:  A 11 week size uterus.  Diffuse proliferative endometrium.  Normal ostia bilaterally.  ANESTHESIA:   General, paracervical block. ESTIMATED BLOOD LOSS:  10 ml COMPLICATIONS:  None immediate.  PROCEDURE DETAILS:  The patient was then taken to the operating room where general anesthesia was administered and was found to be adequate.  After an adequate timeout was performed, she was placed in the dorsal lithotomy position and examined; then prepped and draped in the sterile manner.   Her bladder was catheterized for clear, yellow urine. A speculum was then placed in the patient's vagina and a single tooth tenaculum was applied to the anterior lip of the cervix.   A paracervical block using 30 ml of 0.5% Marcaine was administered.  The cervix was sounded to 10 cm and dilated manually with metal dilators to accommodate the Novasure apparatus.  There were some procedural difficulties, and the Novasure procedure could not be  performed.  The decision was made to proceed with hydrothermal ablation (HTA).  The HTA hysteroscopic apparatus was inserted under direct visualization using normal saline as a suspension medium.  The uterine cavity was carefully examined, both ostia were recognized, and diffusely proliferative endometrium was noted.   The hydrothermal ablation was then carried out as per protocol.   Complete ablation of the endometrium was observed and the hysteroscope was removed under direct visualization.  No complications were observed.  The tenaculum was removed from the anterior lip of the cervix, and the vaginal speculum was removed after noting good hemostasis.  The patient tolerated the procedure well and was taken to the recovery area awake, extubated and in stable condition.  The patient will be discharged to home as per PACU criteria.  Routine postoperative instructions given.  She was prescribed Tramadol, Ibuprofen, Simethicone and Colace.  She will follow up in the clinic on 07/26/2018 for postoperative evaluation.   Kimberly Stuart  Kimberly Mareno, MD, FACOG Obstetrician & Gynecologist Faculty Practice, [facility] [place]

## 2018-07-14 NOTE — Transfer of Care (Signed)
Immediate Anesthesia Transfer of Care Note  Patient: Kimberly Stuart  Procedure(s) Performed: Attempted Novasure Ablation (N/A ) HYSTEROSCOPY WITH HYDROTHERMAL ABLATION (N/A )  Patient Location: PACU  Anesthesia Type:General  Level of Consciousness: awake, alert  and oriented  Airway & Oxygen Therapy: Patient Spontanous Breathing and Patient connected to nasal cannula oxygen  Post-op Assessment: Report given to RN, Post -op Vital signs reviewed and stable and Patient moving all extremities  Post vital signs: Reviewed and stable  Last Vitals:  Vitals Value Taken Time  BP 111/65 07/14/2018  2:45 PM  Temp    Pulse 80 07/14/2018  2:48 PM  Resp 0 07/14/2018  2:48 PM  SpO2 100 % 07/14/2018  2:48 PM  Vitals shown include unvalidated device data.  Last Pain:  Vitals:   07/14/18 1119  TempSrc: Oral  PainSc: 7       Patients Stated Pain Goal: 5 (07/14/18 1119)  Complications: No apparent anesthesia complications

## 2018-07-14 NOTE — Progress Notes (Signed)
Patient was stable at discharge. I removed their IV. We reviewed the discharge education. Patient/Family verbalized understanding and had no further questions. Patient left with prescription/s in hand. Pt understands [facility] to be admitted for outpatient surgery.

## 2018-07-14 NOTE — Anesthesia Preprocedure Evaluation (Addendum)
Anesthesia Evaluation  Patient identified by MRN, date of birth, ID band Patient awake    Reviewed: Allergy & Precautions, NPO status , Patient's Chart, lab work & pertinent test results  Airway Mallampati: II  TM Distance: >3 FB Neck ROM: Full    Dental no notable dental hx. (+) Teeth Intact, Dental Advisory Given   Pulmonary    Pulmonary exam normal breath sounds clear to auscultation       Cardiovascular + DVT (on eliquis)  Normal cardiovascular exam Rhythm:Regular Rate:Normal     Neuro/Psych negative neurological ROS  negative psych ROS   GI/Hepatic negative GI ROS, Neg liver ROS,   Endo/Other  negative endocrine ROS  Renal/GU negative Renal ROS  negative genitourinary   Musculoskeletal negative musculoskeletal ROS (+)   Abdominal   Peds  Hematology negative hematology ROS (+) anemia ,   Anesthesia Other Findings Vaginal bleeding  Reproductive/Obstetrics                            Anesthesia Physical Anesthesia Plan  ASA: III  Anesthesia Plan: General   Post-op Pain Management:    Induction: Intravenous  PONV Risk Score and Plan: 3 and Dexamethasone, Ondansetron and Midazolam  Airway Management Planned: LMA  Additional Equipment:   Intra-op Plan:   Post-operative Plan: Extubation in OR  Informed Consent: I have reviewed the patients History and Physical, chart, labs and discussed the procedure including the risks, benefits and alternatives for the proposed anesthesia with the patient or authorized representative who has indicated his/her understanding and acceptance.   Dental advisory given  Plan Discussed with: CRNA  Anesthesia Plan Comments:         Anesthesia Quick Evaluation

## 2018-07-14 NOTE — Discharge Instructions (Signed)
DISCHARGE INSTRUCTIONS: HYSTEROSCOPY / ENDOMETRIAL ABLATION The following instructions have been prepared to help you care for yourself upon your return home.  May Remove Scop patch on or before  May take Ibuprofen after  May take stool softner while taking narcotic pain medication to prevent constipation.  Drink plenty of water.  Personal hygiene:  Use sanitary pads for vaginal drainage, not tampons.  Shower the day after your procedure.  NO tub baths, pools or Jacuzzis for 2-3 weeks.  Wipe front to back after using the bathroom.  Activity and limitations:  Do NOT drive or operate any equipment for 24 hours. The effects of anesthesia are still present and drowsiness may result.  Do NOT rest in bed all day.  Walking is encouraged.  Walk up and down stairs slowly.  You may resume your normal activity in one to two days or as indicated by your physician. Sexual activity: NO intercourse for at least 2 weeks after the procedure, or as indicated by your Doctor.  Diet: Eat a light meal as desired this evening. You may resume your usual diet tomorrow.  Return to Work: You may resume your work activities in one to two days or as indicated by Therapist, sportsyour Doctor.  What to expect after your surgery: Expect to have vaginal bleeding/discharge for 2-3 days and spotting for up to 10 days. It is not unusual to have soreness for up to 1-2 weeks. You may have a slight burning sensation when you urinate for the first day. Mild cramps may continue for a couple of days. You may have a regular period in 2-6 weeks.  Call your doctor for any of the following:  Excessive vaginal bleeding or clotting, saturating and changing one pad every hour.  Inability to urinate 6 hours after discharge from hospital.  Pain not relieved by pain medication.  Fever of 100.4 F or greater.  Unusual vaginal discharge or odor.  Return to office _________________Call for an appointment  ___________________ Patients signature: ______________________ Nurses signature ________________________  Post Anesthesia Care Unit [phone] Ablation Endometrial ablation is a procedure that destroys the thin inner layer of the lining of the uterus (endometrium). This procedure may be done:  To stop heavy periods.  To stop bleeding that is causing anemia.  To control irregular bleeding.  To treat bleeding caused by small tumors (fibroids) in the endometrium.  This procedure is often an alternative to major surgery, such as removal of the uterus and cervix (hysterectomy). As a result of this procedure:  You may not be able to have children. However, if you are premenopausal (you have not gone through menopause): ? You may still have a small chance of getting pregnant. ? You will need to use a reliable method of birth control after the procedure to prevent pregnancy.  You may stop having a menstrual period, or you may have only a small amount of bleeding during your period. Menstruation may return several years after the procedure.  Tell a health care provider about:  Any allergies you have.  All medicines you are taking, including vitamins, herbs, eye drops, creams, and over-the-counter medicines.  Any problems you or family members have had with the use of anesthetic medicines.  Any blood disorders you have.  Any surgeries you have had.  Any medical conditions you have. What are the risks? Generally, this is a safe procedure. However, problems may occur, including:  A hole (perforation) in the uterus or bowel.  Infection of the uterus, bladder, or vagina.  Bleeding.  Damage to other structures or organs.  An air bubble in the lung (air embolus).  Problems with pregnancy after the procedure.  Failure of the procedure.  Decreased ability to diagnose cancer in the endometrium.  What happens before the procedure?  You will have tests of your  endometrium to make sure there are no pre-cancerous cells or cancer cells present.  You may have an ultrasound of the uterus.  You may be given medicines to thin the endometrium.  Ask your health care provider about: ? Changing or stopping your regular medicines. This is especially important if you take diabetes medicines or blood thinners. ? Taking medicines such as aspirin and ibuprofen. These medicines can thin your blood. Do not take these medicines before your procedure if your doctor tells you not to.  Plan to have someone take you home from the hospital or clinic. What happens during the procedure?  You will lie on an exam table with your feet and legs supported as in a pelvic exam.  To lower your risk of infection: ? Your health care team will wash or sanitize their hands and put on germ-free (sterile) gloves. ? Your genital area will be washed with soap.  An IV tube will be inserted into one of your veins.  You will be given a medicine to help you relax (sedative).  A surgical instrument with a light and camera (resectoscope) will be inserted into your vagina and moved into your uterus. This allows your surgeon to see inside your uterus.  Endometrial tissue will be removed using one of the following methods: ? Radiofrequency. This method uses a radiofrequency-alternating electric current to remove the endometrium. ? Cryotherapy. This method uses extreme cold to freeze the endometrium. ? Heated-free liquid. This method uses a heated saltwater (saline) solution to remove the endometrium. ? Microwave. This method uses high-energy microwaves to heat up the endometrium and remove it. ? Thermal balloon. This method involves inserting a catheter with a balloon tip into the uterus. The balloon tip is filled with heated fluid to remove the endometrium. The procedure may vary among health care providers and hospitals. What happens after the procedure?  Your blood pressure, heart  rate, breathing rate, and blood oxygen level will be monitored until the medicines you were given have worn off.  As tissue healing occurs, you may notice vaginal bleeding for 4-6 weeks after the procedure. You may also experience: ? Cramps. ? Thin, watery vaginal discharge that is light pink or brown in color. ? A need to urinate more frequently than usual. ? Nausea.  Do not drive for 24 hours if you were given a sedative.  Do not have sex or insert anything into your vagina until your health care provider approves. Summary  Endometrial ablation is done to treat the many causes of heavy menstrual bleeding.  The procedure may be done only after medications have been tried to control the bleeding.  Plan to have someone take you home from the hospital or clinic. This information is not intended to replace advice given to you by your health care provider. Make sure you discuss any questions you have with your health care provider. Document Released: 08/27/2004 Document Revised: 11/04/2016 Document Reviewed: 11/04/2016 Elsevier Interactive Patient Education  2017 [employer].

## 2018-07-14 NOTE — Progress Notes (Signed)
   Subjective: Patient states she is ready to have her ablation. She continues to have abdominal pain.   Objective:  Vital signs in last 24 hours: Vitals:   07/13/18 0400 07/13/18 0819 07/13/18 1641 07/13/18 2331  BP: (!) 81/57 (!) 110/59 (!) 88/52 (!) 105/56  Pulse: 92 81 92 86  Resp: (!) 28   16  Temp: 98.9 F (37.2 C) 97.7 F (36.5 C) 99.5 F (37.5 C) 99.3 F (37.4 C)  TempSrc: Oral Oral Oral Oral  SpO2: 100% 98% 100% 98%  Weight:      Height:       Constitution:NAD, ambulating sitting up in bed,cooperative Cardio:rrr,no m/r/g, nml S1, S2 Respiratory:clear to auscultation, no wheezing ralres or rhonchi Abdominal:diffusely TTP, no guarding,soft, non-distended, MSK:+pulses, warm extremities, no edema, calves symmetrical and non-tender to palpation Neuro:alert & oriented, normal affect Skin:bruising over lovenox injection site, noerythema, c/d/i otherwise   Assessment/Plan:  Principal Problem:   Symptomatic anemia Active Problems:   Abnormal uterine bleeding (AUB)   Acute deep vein thrombosis (DVT) of left lower extremity (HCC)   Pelvic pain   Right upper quadrant abdominal pain  47 y/o F with several month history of menometrorrhagia and recent DVT on Xarelto who presents 5-days after endometrial biopsy with increasing pelvic pain, vaginal bleeding and symptomatic anemia despite compliance with TID Megace.   Menormetrorrhagia Acute Blood Loss Anemia Hb trending down 8.9 >> 8.4 this am. She continues to have metromenorrhagia She will be discharged in early am to have her ablation today.    - ob/gyn consulted, appreciate their assistance and advice  - oxy IR 5mg  q4h prn pain - cont. Iron 325mg    Left Popliteal ZOX:[id number]:Started on eliquis.   - cont. eliquis - monitor for signs of DVT or clotting  - No SCDs  Difficult Affording Medications:Eliquis at discharge  Diet:regular UJW:JXBJVF:none DVT YNW:GNFAOZHppx:Eliquis  Dispo: Anticipated discharge today.    Guinevere ScarletSeawell, Jaimie A, DO 07/14/2018, 7:08 AM Pager: [phone]

## 2018-07-14 NOTE — Plan of Care (Signed)
Discussed plan of care for the evening with patient.  Emphasized safety and using the call bell when assistance is needed.  Good teach back displayed.

## 2018-07-15 ENCOUNTER — Encounter (HOSPITAL_COMMUNITY): Payer: Self-pay | Admitting: Obstetrics & Gynecology

## 2018-07-18 ENCOUNTER — Inpatient Hospital Stay (HOSPITAL_COMMUNITY): Payer: Medicaid Other

## 2018-07-18 ENCOUNTER — Other Ambulatory Visit: Payer: Self-pay

## 2018-07-18 ENCOUNTER — Inpatient Hospital Stay (HOSPITAL_COMMUNITY)
Admission: AD | Admit: 2018-07-18 | Discharge: 2018-07-29 | DRG: 336 | Disposition: A | Discharge status: Home / Self Care | Payer: Medicaid Other | Attending: Obstetrics and Gynecology | Admitting: Obstetrics and Gynecology

## 2018-07-18 ENCOUNTER — Encounter (HOSPITAL_COMMUNITY): Payer: Self-pay

## 2018-07-18 DIAGNOSIS — D500 Iron deficiency anemia secondary to blood loss (chronic): Secondary | ICD-10-CM

## 2018-07-18 DIAGNOSIS — K5720 Diverticulitis of large intestine with perforation and abscess without bleeding: Secondary | ICD-10-CM | POA: Diagnosis present

## 2018-07-18 DIAGNOSIS — G8918 Other acute postprocedural pain: Secondary | ICD-10-CM | POA: Diagnosis not present

## 2018-07-18 DIAGNOSIS — R1032 Left lower quadrant pain: Secondary | ICD-10-CM

## 2018-07-18 DIAGNOSIS — Z86718 Personal history of other venous thrombosis and embolism: Secondary | ICD-10-CM

## 2018-07-18 DIAGNOSIS — Z7901 Long term (current) use of anticoagulants: Secondary | ICD-10-CM

## 2018-07-18 DIAGNOSIS — D649 Anemia, unspecified: Secondary | ICD-10-CM | POA: Diagnosis present

## 2018-07-18 DIAGNOSIS — N739 Female pelvic inflammatory disease, unspecified: Secondary | ICD-10-CM

## 2018-07-18 DIAGNOSIS — K5909 Other constipation: Secondary | ICD-10-CM | POA: Diagnosis present

## 2018-07-18 DIAGNOSIS — R109 Unspecified abdominal pain: Secondary | ICD-10-CM | POA: Diagnosis present

## 2018-07-18 DIAGNOSIS — I82402 Acute embolism and thrombosis of unspecified deep veins of left lower extremity: Secondary | ICD-10-CM | POA: Diagnosis present

## 2018-07-18 DIAGNOSIS — Z87410 Personal history of cervical dysplasia: Secondary | ICD-10-CM

## 2018-07-18 DIAGNOSIS — N939 Abnormal uterine and vaginal bleeding, unspecified: Secondary | ICD-10-CM | POA: Diagnosis present

## 2018-07-18 DIAGNOSIS — J9811 Atelectasis: Secondary | ICD-10-CM | POA: Diagnosis present

## 2018-07-18 DIAGNOSIS — J90 Pleural effusion, not elsewhere classified: Secondary | ICD-10-CM | POA: Diagnosis present

## 2018-07-18 DIAGNOSIS — N736 Female pelvic peritoneal adhesions (postinfective): Secondary | ICD-10-CM | POA: Diagnosis present

## 2018-07-18 DIAGNOSIS — K651 Peritoneal abscess: Principal | ICD-10-CM | POA: Diagnosis present

## 2018-07-18 DIAGNOSIS — R161 Splenomegaly, not elsewhere classified: Secondary | ICD-10-CM | POA: Diagnosis present

## 2018-07-18 DIAGNOSIS — E876 Hypokalemia: Secondary | ICD-10-CM | POA: Diagnosis present

## 2018-07-18 HISTORY — DX: Acute embolism and thrombosis of unspecified vein: I82.90

## 2018-07-18 LAB — URINALYSIS, ROUTINE W REFLEX MICROSCOPIC
Bilirubin Urine: NEGATIVE
GLUCOSE, UA: NEGATIVE mg/dL
Ketones, ur: NEGATIVE mg/dL
NITRITE: NEGATIVE
PH: 6 (ref 5.0–8.0)
PROTEIN: 30 mg/dL — AB
RBC / HPF: >50 RBC/hpf — ABNORMAL HIGH (ref 0–5)
Specific Gravity, Urine: 1.021 (ref 1.005–1.030)
WBC, UA: >50 WBC/hpf — ABNORMAL HIGH (ref 0–5)

## 2018-07-18 LAB — TYPE AND SCREEN
ABO/RH(D): A POS
ANTIBODY SCREEN: NEGATIVE
Unit division: 0
Unit division: 0

## 2018-07-18 LAB — APTT: APTT: 41 s — AB (ref 24–36)

## 2018-07-18 LAB — FIBRINOGEN: FIBRINOGEN: 703 mg/dL — AB (ref 210–475)

## 2018-07-18 LAB — BPAM RBC
BLOOD PRODUCT EXPIRATION DATE: 201910032359
Blood Product Expiration Date: 201909272359
Unit Type and Rh: 6200
Unit Type and Rh: 6200

## 2018-07-18 LAB — COMPREHENSIVE METABOLIC PANEL
ALT: 22 U/L (ref 0–44)
AST: 16 U/L (ref 15–41)
Albumin: 3 g/dL — ABNORMAL LOW (ref 3.5–5.0)
Alkaline Phosphatase: 93 U/L (ref 38–126)
Anion gap: 13 (ref 5–15)
BILIRUBIN TOTAL: 0.7 mg/dL (ref 0.3–1.2)
BUN: 12 mg/dL (ref 6–20)
CHLORIDE: 103 mmol/L (ref 98–111)
CO2: 19 mmol/L — ABNORMAL LOW (ref 22–32)
Calcium: 8.4 mg/dL — ABNORMAL LOW (ref 8.9–10.3)
Creatinine, Ser: 1.05 mg/dL — ABNORMAL HIGH (ref 0.44–1.00)
Glucose, Bld: 124 mg/dL — ABNORMAL HIGH (ref 70–99)
POTASSIUM: 3.2 mmol/L — AB (ref 3.5–5.1)
Sodium: 135 mmol/L (ref 135–145)
TOTAL PROTEIN: 6.7 g/dL (ref 6.5–8.1)

## 2018-07-18 LAB — CBC WITH DIFFERENTIAL/PLATELET
Basophils Absolute: 0 10*3/uL (ref 0.0–0.1)
Basophils Relative: 0 %
EOS PCT: 1 %
Eosinophils Absolute: 0.1 10*3/uL (ref 0.0–0.7)
HCT: 24.5 % — ABNORMAL LOW (ref 36.0–46.0)
Hemoglobin: 8 g/dL — ABNORMAL LOW (ref 12.0–15.0)
LYMPHS PCT: 13 %
Lymphs Abs: 1.1 10*3/uL (ref 0.7–4.0)
MCH: 26.1 pg (ref 26.0–34.0)
MCHC: 32.7 g/dL (ref 30.0–36.0)
MCV: 80.1 fL (ref 78.0–100.0)
MONO ABS: 0.4 10*3/uL (ref 0.1–1.0)
MONOS PCT: 5 %
Neutro Abs: 6.4 10*3/uL (ref 1.7–7.7)
Neutrophils Relative %: 81 %
PLATELETS: 264 10*3/uL (ref 150–400)
RBC: 3.06 MIL/uL — ABNORMAL LOW (ref 3.87–5.11)
RDW: 20.4 % — ABNORMAL HIGH (ref 11.5–15.5)
WBC: 7.9 10*3/uL (ref 4.0–10.5)

## 2018-07-18 LAB — PROTIME-INR
INR: 1.57
Prothrombin Time: 18.7 seconds — ABNORMAL HIGH (ref 11.4–15.2)

## 2018-07-18 LAB — POCT PREGNANCY, URINE: Preg Test, Ur: NEGATIVE

## 2018-07-18 MED ORDER — DOCUSATE SODIUM 50 MG/5ML PO LIQD
50.0000 mg | Freq: Once | ORAL | Status: AC
  Administered 2018-07-18: 50 mg via ORAL
  Filled 2018-07-18: qty 10

## 2018-07-18 MED ORDER — FLEET ENEMA 7-19 GM/118ML RE ENEM
1.0000 | ENEMA | Freq: Once | RECTAL | Status: AC
  Administered 2018-07-18: 1 via RECTAL

## 2018-07-18 MED ORDER — MORPHINE SULFATE (PF) 4 MG/ML IV SOLN
1.0000 mg | INTRAVENOUS | Status: DC | PRN
  Administered 2018-07-18 – 2018-07-23 (×5): 2 mg via INTRAVENOUS
  Filled 2018-07-18 (×5): qty 1

## 2018-07-18 MED ORDER — OXYCODONE HCL 5 MG PO TABS
10.0000 mg | ORAL_TABLET | Freq: Once | ORAL | Status: AC
  Administered 2018-07-18: 10 mg via ORAL
  Filled 2018-07-18: qty 2

## 2018-07-18 MED ORDER — APIXABAN 5 MG PO TABS
5.0000 mg | ORAL_TABLET | Freq: Two times a day (BID) | ORAL | Status: DC
  Administered 2018-07-18 – 2018-07-20 (×4): 5 mg via ORAL
  Filled 2018-07-18 (×4): qty 1

## 2018-07-18 MED ORDER — MEGESTROL ACETATE 40 MG PO TABS
80.0000 mg | ORAL_TABLET | Freq: Two times a day (BID) | ORAL | Status: DC
  Administered 2018-07-18 – 2018-07-29 (×22): 80 mg via ORAL
  Filled 2018-07-18 (×22): qty 2

## 2018-07-18 MED ORDER — ONDANSETRON HCL 4 MG/2ML IJ SOLN
4.0000 mg | Freq: Four times a day (QID) | INTRAMUSCULAR | Status: DC | PRN
  Administered 2018-07-19: 4 mg via INTRAVENOUS
  Filled 2018-07-18 (×3): qty 2

## 2018-07-18 MED ORDER — OXYCODONE-ACETAMINOPHEN 5-325 MG PO TABS
1.0000 | ORAL_TABLET | ORAL | Status: DC | PRN
  Administered 2018-07-18 – 2018-07-25 (×20): 2 via ORAL
  Filled 2018-07-18 (×20): qty 2

## 2018-07-18 MED ORDER — LACTATED RINGERS IV SOLN
INTRAVENOUS | Status: DC
  Administered 2018-07-18: 13:00:00 via INTRAVENOUS
  Administered 2018-07-19 (×2): 75 mL/h via INTRAVENOUS
  Administered 2018-07-20: 11:00:00 via INTRAVENOUS

## 2018-07-18 MED ORDER — ONDANSETRON HCL 4 MG PO TABS
4.0000 mg | ORAL_TABLET | Freq: Four times a day (QID) | ORAL | Status: DC | PRN
  Administered 2018-07-19: 4 mg via ORAL
  Filled 2018-07-18: qty 1

## 2018-07-18 MED ORDER — SIMETHICONE 80 MG PO CHEW
80.0000 mg | CHEWABLE_TABLET | Freq: Once | ORAL | Status: AC
  Administered 2018-07-18: 80 mg via ORAL
  Filled 2018-07-18: qty 1

## 2018-07-18 MED ORDER — PRENATAL MULTIVITAMIN CH
1.0000 | ORAL_TABLET | Freq: Every day | ORAL | Status: DC
  Administered 2018-07-18 – 2018-07-24 (×6): 1 via ORAL
  Filled 2018-07-18 (×7): qty 1

## 2018-07-18 MED ORDER — MORPHINE SULFATE (PF) 4 MG/ML IV SOLN
4.0000 mg | Freq: Once | INTRAVENOUS | Status: AC
  Administered 2018-07-18: 4 mg via INTRAVENOUS
  Filled 2018-07-18: qty 1

## 2018-07-18 MED ORDER — ZOLPIDEM TARTRATE 5 MG PO TABS
5.0000 mg | ORAL_TABLET | Freq: Every evening | ORAL | Status: DC | PRN
  Administered 2018-07-18 – 2018-07-23 (×4): 5 mg via ORAL
  Filled 2018-07-18 (×4): qty 1

## 2018-07-18 MED ORDER — LACTATED RINGERS IV BOLUS
1000.0000 mL | Freq: Once | INTRAVENOUS | Status: AC
  Administered 2018-07-18: 1000 mL via INTRAVENOUS

## 2018-07-18 MED ORDER — APIXABAN 5 MG PO TABS
5.0000 mg | ORAL_TABLET | Freq: Once | ORAL | Status: AC
  Administered 2018-07-18: 5 mg via ORAL
  Filled 2018-07-18: qty 1

## 2018-07-18 NOTE — H&P (Signed)
History  CSN: [id number] Arrival date and time: 07/18/18 2130 First Provider Initiated Contact with Patient 07/18/18 0539        Chief Complaint  Patient presents with  . Abdominal Pain   HPI  Kimberly Stuart is a 47 y.o. [phone] non pregnant patient who presents to [facility] with chief complaint of severe abdominal pain, new onset this morning at approximately 0230. Pain is 9-10/10, across entire abdomen, radiating to low back. Denies aggravating or alleviating factors.  Denies nausea, vomiting. Reports elevated oral temp of 100.2 at home. Patient endorses taking 50 mg Tramadol at 0230 this morning but it has not provided relief.  Patient is s/p hydrothermal endometrial ablation 07/14/2018 for abnormal uterine bleeding. She endorses adherence to prescribed medication regimen of Motrin and Tramadol for pain but is unable to confirm how frequently she is taking it. Unable to verbalize if she has been taking prescribed Simethicone.  Denies flatus since procedure. Reports passing two small hard "pebbles" of stool yesterday. Reports that her abdomen is now tender to touch, unable to tolerate light palpation. Unable to quantify PO hydration since procedure.  Patient states she saw her PCP after discharge from her ablation, he ran a CBC and told her she was too anemic to do much walking around. As a result, she has been resting at home with her mother providing assistance with ADLs.  Pertinent Gynecological History: Menses: previously regular cycle with heavy intermenstrual bleeding Contraception: none DES exposure: denies Blood transfusions: none Sexually transmitted diseases: no past history Previous GYN Procedures: endometrial ablation  Last mammogram: normal Date: 2012      Past Medical History:  Diagnosis Date  . Anemia   . Vitamin D deficiency          Past Surgical History:  Procedure Laterality Date  . DILITATION & CURRETTAGE/HYSTROSCOPY WITH NOVASURE ABLATION N/A  07/14/2018   Procedure: Attempted Novasure Ablation;  Surgeon: Tereso Newcomer, MD;  Location: [facility];  Service: Gynecology;  Laterality: N/A;  . HYSTEROSCOPY N/A 07/14/2018   Procedure: HYSTEROSCOPY WITH HYDROTHERMAL ABLATION;  Surgeon: Tereso Newcomer, MD;  Location: [facility];  Service: Gynecology;  Laterality: N/A;  . WISDOM TOOTH EXTRACTION      History reviewed. No pertinent family history.  Social History       Tobacco Use  . Smoking status: Never Smoker  . Smokeless tobacco: Never Used  Substance Use Topics  . Alcohol use: No  . Drug use: No    Allergies:       Allergies  Allergen Reactions  . Estrogens     DVT           Medications Prior to Admission  Medication Sig Dispense Refill Last Dose  . acetaminophen (TYLENOL) 325 MG tablet Take 2 tablets (650 mg total) by mouth every 6 (six) hours as needed for mild pain (or Fever >/= 101). 20 tablet 0   . apixaban (ELIQUIS) 5 MG TABS tablet Take 1 tablet (5 mg total) by mouth 2 (two) times daily. 60 tablet 0 07/13/2018 at Unknown time  . docusate sodium (COLACE) 100 MG capsule Take 1 capsule (100 mg total) by mouth 2 (two) times daily as needed for mild constipation or moderate constipation. 30 capsule 2   . ferrous sulfate 325 (65 FE) MG tablet Take 1 tablet (325 mg total) by mouth 2 (two) times daily with a meal. 60 tablet 3   . ibuprofen (ADVIL,MOTRIN) 600 MG tablet Take 1 tablet (600 mg total) by mouth  every 6 (six) hours as needed for headache, mild pain, moderate pain or cramping. 60 tablet 2   . magnesium (MAGTAB) 84 MG (7MEQ) TBCR SR tablet Take 250 mg by mouth daily.    Unknown at Unknown time  . megestrol (MEGACE) 40 MG tablet Take 2 tablets (80 mg total) by mouth 2 (two) times daily. 30 tablet 3   . Multiple Vitamins-Minerals (MULTIVITAMIN) tablet Take 1 tablet by mouth daily. (Patient not taking: Reported on 07/05/2018)   Not Taking at Unknown time  . ondansetron (ZOFRAN) 4 MG tablet Take 1  tablet (4 mg total) by mouth every 6 (six) hours as needed for nausea. 20 tablet 0 Past Week at Unknown time  . simethicone (GAS-X) 80 MG chewable tablet Chew 2 tablets (160 mg total) by mouth every 6 (six) hours as needed (gas pain). 30 tablet 2   . traMADol (ULTRAM) 50 MG tablet Take 1 tablet (50 mg total) by mouth every 6 (six) hours as needed for severe pain (Postoperative pain). 30 tablet 0     Review of Systems  Constitutional: Positive for appetite change. Negative for chills, fatigue and fever.  Gastrointestinal: Positive for abdominal distention, abdominal pain and constipation.  Genitourinary: Positive for vaginal bleeding. Negative for difficulty urinating and vaginal pain.  Musculoskeletal: Positive for back pain.  Neurological: Negative for headaches.  All other systems reviewed and are negative.  Physical Exam   Blood pressure 120/66, pulse 77, temperature 98.1 F (36.7 C), temperature source Oral, resp. rate (!) 24, weight 78.7 kg.  Physical Exam  Nursing note and vitals reviewed. Constitutional: She is oriented to person, place, and time. She appears well-developed and well-nourished.  Eyes: Pupils are equal, round, and reactive to light.  Cardiovascular: Normal rate, normal heart sounds and intact distal pulses.  Respiratory: Effort normal and breath sounds normal.  GI: She exhibits no mass. There is generalized tenderness. There is rebound and guarding. There is no rigidity, no CVA tenderness, no tenderness at McBurney's point and negative Murphy's sign.  Hypoactive bowel sounds in all four quadrants.  Neurological: She is oriented to person, place, and time. She has normal reflexes.  Skin: Skin is warm and dry.  Psychiatric: She has a normal mood and affect. Her behavior is normal. Thought content normal.   Abdomen significantly more tender in LLQ  MAU Course  Procedures  MDM --Able to pass large bowel movement after enema in MAU  Patient Vitals for  the past 24 hrs:  BP Temp Temp src Pulse Resp Weight  07/18/18 0537 120/66 98.1 F (36.7 C) Oral 77 (!) 24 -  07/18/18 0529 - - - - - 78.7 kg         Orders Placed This Encounter  Procedures  . Culture, Urine    Standing Status:   Standing    Number of Occurrences:   1  . US PELVIC DOPPLER (TORSION R/O OR MASS ARTERIAL FLOW)    S/p endometrial ablation 07/14/18, chief complaint 10/10 pain across entire abdomen and constipation    Standing Status:   Standing    Number of Occurrences:   1    Order Specific Question:   Symptom/Reason for Exam    Answer:   Abdominal pain [[id number]]  . US PELVIS (TRANSABDOMINAL ONLY)    S/p endometrial ablation 07/14/18, chief complaint 10/10 pain across entire abdomen and constipation    Standing Status:   Standing    Number of Occurrences:   1    Order Specific  Question:   Symptom/Reason for Exam    Answer:   Abdominal pain [id number]  . CT ABDOMEN PELVIS WO CONTRAST    Standing Status:   Standing    Number of Occurrences:   1    Order Specific Question:   ** REASON FOR EXAM (FREE TEXT)    Answer:   recent history of ablation for AUB, worsening lower abdominal/pelvic and LBP, some bleeding seen on recent U/S and CT. Evaluate for free fluid, kidney stone.    Order Specific Question:   Is Oral Contrast requested for this exam?    Answer:   Yes, Per Radiology protocol    Order Specific Question:   Call Results- Best Contact Number?    Answer:   [phone]    Order Specific Question:   Radiology Contrast Protocol - do NOT remove file path    Answer:   [email or web address]  . Urinalysis, Routine w reflex microscopic    Standing Status:   Standing    Number of Occurrences:   1  . CBC with Differential/Platelet    Standing Status:   Standing    Number of Occurrences:   1  . Comprehensive metabolic panel    Standing Status:   Standing    Number of Occurrences:   1  . Pregnancy,  urine POC    Standing Status:   Standing    Number of Occurrences:   1  . Insert peripheral IV    Standing Status:   Standing    Number of Occurrences:   1    LabResultsLast24Hours       Results for orders placed or performed during the hospital encounter of 07/18/18 (from the past 24 hour(s))  Urinalysis, Routine w reflex microscopic     Status: Abnormal   Collection Time: 07/18/18  5:55 AM  Result Value Ref Range   Color, Urine YELLOW YELLOW   APPearance CLOUDY (A) CLEAR   Specific Gravity, Urine 1.021 1.005 - 1.030   pH 6.0 5.0 - 8.0   Glucose, UA NEGATIVE NEGATIVE mg/dL   Hgb urine dipstick LARGE (A) NEGATIVE   Bilirubin Urine NEGATIVE NEGATIVE   Ketones, ur NEGATIVE NEGATIVE mg/dL   Protein, ur 30 (A) NEGATIVE mg/dL   Nitrite NEGATIVE NEGATIVE   Leukocytes, UA LARGE (A) NEGATIVE   RBC / HPF >50 (H) 0 - 5 RBC/hpf   WBC, UA >50 (H) 0 - 5 WBC/hpf   Bacteria, UA RARE (A) NONE SEEN   Squamous Epithelial / LPF 0-5 0 - 5   WBC Clumps PRESENT    Mucus PRESENT   CBC with Differential/Platelet     Status: Abnormal   Collection Time: 07/18/18  6:19 AM  Result Value Ref Range   WBC 7.9 4.0 - 10.5 K/uL   RBC 3.06 (L) 3.87 - 5.11 MIL/uL   Hemoglobin 8.0 (L) 12.0 - 15.0 g/dL   HCT 98.1 (L) 19.1 - 47.8 %   MCV 80.1 78.0 - 100.0 fL   MCH 26.1 26.0 - 34.0 pg   MCHC 32.7 30.0 - 36.0 g/dL   RDW 29.5 (H) 62.1 - 30.8 %   Platelets 264 150 - 400 K/uL   Neutrophils Relative % 81 %   Neutro Abs 6.4 1.7 - 7.7 K/uL   Lymphocytes Relative 13 %   Lymphs Abs 1.1 0.7 - 4.0 K/uL   Monocytes Relative 5 %   Monocytes Absolute 0.4 0.1 - 1.0 K/uL   Eosinophils Relative 1 %   Eosinophils Absolute 0.1  0.0 - 0.7 K/uL   Basophils Relative 0 %   Basophils Absolute 0.0 0.0 - 0.1 K/uL  Comprehensive metabolic panel     Status: Abnormal   Collection Time: 07/18/18  6:19 AM  Result Value Ref Range   Sodium 135 135 - 145 mmol/L   Potassium 3.2  (L) 3.5 - 5.1 mmol/L   Chloride 103 98 - 111 mmol/L   CO2 19 (L) 22 - 32 mmol/L   Glucose, Bld 124 (H) 70 - 99 mg/dL   BUN 12 6 - 20 mg/dL   Creatinine, Ser 7.42 (H) 0.44 - 1.00 mg/dL   Calcium 8.4 (L) 8.9 - 10.3 mg/dL   Total Protein 6.7 6.5 - 8.1 g/dL   Albumin 3.0 (L) 3.5 - 5.0 g/dL   AST 16 15 - 41 U/L   ALT 22 0 - 44 U/L   Alkaline Phosphatase 93 38 - 126 U/L   Total Bilirubin 0.7 0.3 - 1.2 mg/dL   GFR calc non Af Amer >60 >60 mL/min   GFR calc Af Amer >60 >60 mL/min   Anion gap 13 5 - 15  Pregnancy, urine POC     Status: None   Collection Time: 07/18/18  6:21 AM  Result Value Ref Range   Preg Test, Ur NEGATIVE NEGATIVE        Meds ordered this encounter  Medications  . simethicone (MYLICON) chewable tablet 80 mg  . docusate (COLACE) 50 MG/5ML liquid 50 mg  . sodium phosphate (FLEET) 7-19 GM/118ML enema 1 enema  . oxyCODONE (Oxy IR/ROXICODONE) immediate release tablet 10 mg  . apixaban (ELIQUIS) tablet 5 mg  . lactated ringers bolus 1,000 mL  . morphine 4 MG/ML injection 4 mg    Report given to L. Earlene Plater, DO who assumes care of patient at this time.  Clayton Bibles, CNM 07/18/18  10:19 AM    Assessment and Plan   Assumption of Care: Resumed care from [facility]. Breif[place], this is a 47yo [id number] who presents with worsening abdominal pain after a hydrothermal endometrial ablation 07/14/18 for abnormal uterine bleeding. 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 Has had 2 significant episodes of AUB, once in 2017 and then this episode which started in July of this year. Managed initially with OCPs and Megace as well as NSAIDs for pain relief. Unable to afford Megace so switched to Provera. Continued bleeding, so took Megace for 3 days. Went to [facility] ER for continued symptoms, HgB 6.1, given 2U pRBCs and admitted to hospital. Also received TXA. Switched to [employer] for affordability. No  improvement so scheduled for endometrial ablation. Novasure was unable to be performed, so patient had hydrothermal ablation.  Since arrival to [facility]U, her pain has been difficult to control though she appears comfortable sitting in bed. She has received oxycodone for her pain with minimal relief. Awaiting U/S results at this time. Lab work-up so far notable for persistent anemia with HgB 8 down from 8.6 on day of procedure. Also mildly hypokalemic at 3.2 and Cr increase of 1.05 from 0.84, a 25% increase. Had a temperature of 100.2 at home, some light vaginal bleeding.   Pelvic U/S: Limited evaluation because of post-procedural gas and limitations of abdominal evaluation. Left adnexal ovarian/tubal mass - 8.8x6.5x6.3 - combination of normal ovary, hemorrhage, and gas within tube related to recent procedure. Normal doppler flow.   On my exam, patient is tearful, sitting with hips flexed. Diffuse lower abdominal  tenderness on exam, worse on right and periumbilical than left. No CVA tenderness, more lower back and buttock. Has had no pain relief. Will start IV, give morphine and IVF, and plan for CT scan A/P to evaluate for free pelvic fluid/blood, possible kidney stone. Had CT scan last week with mild retroperitoneal LAD and moderate amount of hemorrhagic fluid in pelvis. Will add-on urine culture for U/A - only symptom is pressure no dysuria or frequency. Has had no pain relief from fleet enema. HgB stable at 8.0.   CT with fluid and gas in LLQ. Will consult with Dr. Debroah Loop for next steps in management.   Given continued pain and CT findings, DDX includes hemorrhagic cyst, pelvic abscess, diverticulitis, gas pain. Will admit for trending CBC, pain control.  I examined the patient and reviewed labs and imaging including viewing CT images. I agree with the differential diagnosis above and will follow her labs and exam, with repeat imaging if needed  Adam Phenix, MD 07/18/2018 12:27 PM

## 2018-07-18 NOTE — MAU Provider Note (Addendum)
History    CSN: [id number] Arrival date and time: 07/18/18 [id number] First Provider Initiated Contact with Patient 07/18/18 0539     Chief Complaint  Patient presents with  . Abdominal Pain   HPI  Kimberly Stuart is a 47 y.o. [phone] non pregnant patient who presents to MAU with chief complaint of severe abdominal pain, new onset this morning at approximately 0230. Pain is 9-10/10, across entire abdomen, radiating to low back. Denies aggravating or alleviating factors.  Denies nausea, vomiting. Reports elevated oral temp of 100.2 at home. Patient endorses taking 50 mg Tramadol at 0230 this morning but it has not provided relief.  Patient is s/p hydrothermal endometrial ablation 07/14/2018 for abnormal uterine bleeding. She endorses adherence to prescribed medication regimen of Motrin and Tramadol for pain but is unable to confirm how frequently she is taking it. Unable to verbalize if she has been taking prescribed Simethicone.  Denies flatus since procedure. Reports passing two small hard "pebbles" of stool yesterday. Reports that her abdomen is now tender to touch, unable to tolerate light palpation. Unable to quantify PO hydration since procedure.  Patient states she saw her PCP after discharge from her ablation, he ran a CBC and told her she was too anemic to do much walking around. As a result, she has been resting at home with her mother providing assistance with ADLs.  Pertinent Gynecological History: Menses: previously regular cycle with heavy intermenstrual bleeding Contraception: none DES exposure: denies Blood transfusions: none Sexually transmitted diseases: no past history Previous GYN Procedures: endometrial ablation  Last mammogram: normal Date: 2012  Past Medical History:  Diagnosis Date  . Anemia   . Vitamin D deficiency     Past Surgical History:  Procedure Laterality Date  . DILITATION & CURRETTAGE/HYSTROSCOPY WITH NOVASURE ABLATION N/A 07/14/2018   Procedure:  Attempted Novasure Ablation;  Surgeon: Tereso NewcomerAnyanwu, Ugonna A, MD;  Location: [facility];  Service: Gynecology;  Laterality: N/A;  . HYSTEROSCOPY N/A 07/14/2018   Procedure: HYSTEROSCOPY WITH HYDROTHERMAL ABLATION;  Surgeon: Tereso NewcomerAnyanwu, Ugonna A, MD;  Location: [facility];  Service: Gynecology;  Laterality: N/A;  . WISDOM TOOTH EXTRACTION      History reviewed. No pertinent family history.  Social History   Tobacco Use  . Smoking status: Never Smoker  . Smokeless tobacco: Never Used  Substance Use Topics  . Alcohol use: No  . Drug use: No    Allergies:  Allergies  Allergen Reactions  . Estrogens     DVT    Medications Prior to Admission  Medication Sig Dispense Refill Last Dose  . acetaminophen (TYLENOL) 325 MG tablet Take 2 tablets (650 mg total) by mouth every 6 (six) hours as needed for mild pain (or Fever >/= 101). 20 tablet 0   . apixaban (ELIQUIS) 5 MG TABS tablet Take 1 tablet (5 mg total) by mouth 2 (two) times daily. 60 tablet 0 07/13/2018 at Unknown time  . docusate sodium (COLACE) 100 MG capsule Take 1 capsule (100 mg total) by mouth 2 (two) times daily as needed for mild constipation or moderate constipation. 30 capsule 2   . ferrous sulfate 325 (65 FE) MG tablet Take 1 tablet (325 mg total) by mouth 2 (two) times daily with a meal. 60 tablet 3   . ibuprofen (ADVIL,MOTRIN) 600 MG tablet Take 1 tablet (600 mg total) by mouth every 6 (six) hours as needed for headache, mild pain, moderate pain or cramping. 60 tablet 2   . magnesium (MAGTAB) 84 MG (7MEQ) TBCR  SR tablet Take 250 mg by mouth daily.    Unknown at Unknown time  . megestrol (MEGACE) 40 MG tablet Take 2 tablets (80 mg total) by mouth 2 (two) times daily. 30 tablet 3   . Multiple Vitamins-Minerals (MULTIVITAMIN) tablet Take 1 tablet by mouth daily. (Patient not taking: Reported on 07/05/2018)   Not Taking at Unknown time  . ondansetron (ZOFRAN) 4 MG tablet Take 1 tablet (4 mg total) by mouth every 6 (six) hours as needed for nausea.  20 tablet 0 Past Week at Unknown time  . simethicone (GAS-X) 80 MG chewable tablet Chew 2 tablets (160 mg total) by mouth every 6 (six) hours as needed (gas pain). 30 tablet 2   . traMADol (ULTRAM) 50 MG tablet Take 1 tablet (50 mg total) by mouth every 6 (six) hours as needed for severe pain (Postoperative pain). 30 tablet 0     Review of Systems  Constitutional: Positive for appetite change. Negative for chills, fatigue and fever.  Gastrointestinal: Positive for abdominal distention, abdominal pain and constipation.  Genitourinary: Positive for vaginal bleeding. Negative for difficulty urinating and vaginal pain.  Musculoskeletal: Positive for back pain.  Neurological: Negative for headaches.  All other systems reviewed and are negative.  Physical Exam   Blood pressure 120/66, pulse 77, temperature 98.1 F (36.7 C), temperature source Oral, resp. rate (!) 24, weight 78.7 kg.  Physical Exam  Nursing note and vitals reviewed. Constitutional: She is oriented to person, place, and time. She appears well-developed and well-nourished.  Eyes: Pupils are equal, round, and reactive to light.  Cardiovascular: Normal rate, normal heart sounds and intact distal pulses.  Respiratory: Effort normal and breath sounds normal.  GI: She exhibits no mass. There is generalized tenderness. There is rebound and guarding. There is no rigidity, no CVA tenderness, no tenderness at McBurney's point and negative Murphy's sign.  Hypoactive bowel sounds in all four quadrants.  Neurological: She is oriented to person, place, and time. She has normal reflexes.  Skin: Skin is warm and dry.  Psychiatric: She has a normal mood and affect. Her behavior is normal. Thought content normal.   Abdomen significantly more tender in LLQ  MAU Course  Procedures  MDM --Able to pass large bowel movement after enema in MAU  Patient Vitals for the past 24 hrs:  BP Temp Temp src Pulse Resp Weight  07/18/18 0537 120/66 98.1  F (36.7 C) Oral 77 (!) 24 -  07/18/18 0529 - - - - - 78.7 kg    Orders Placed This Encounter  Procedures  . Culture, Urine    Standing Status:   Standing    Number of Occurrences:   1  . US PELVIC DOPPLER (TORSION R/O OR MASS ARTERIAL FLOW)    S/p endometrial ablation 07/14/18, chief complaint 10/10 pain across entire abdomen and constipation    Standing Status:   Standing    Number of Occurrences:   1    Order Specific Question:   Symptom/Reason for Exam    Answer:   Abdominal pain [454098]  . US PELVIS (TRANSABDOMINAL ONLY)    S/p endometrial ablation 07/14/18, chief complaint 10/10 pain across entire abdomen and constipation    Standing Status:   Standing    Number of Occurrences:   1    Order Specific Question:   Symptom/Reason for Exam    Answer:   Abdominal pain [119147]  . CT ABDOMEN PELVIS WO CONTRAST    Standing Status:   Standing  Number of Occurrences:   1    Order Specific Question:   ** REASON FOR EXAM (FREE TEXT)    Answer:   recent history of ablation for AUB, worsening lower abdominal/pelvic and LBP, some bleeding seen on recent U/S and CT. Evaluate for free fluid, kidney stone.    Order Specific Question:   Is Oral Contrast requested for this exam?    Answer:   Yes, Per Radiology protocol    Order Specific Question:   Call Results- Best Contact Number?    Answer:   [phone]    Order Specific Question:   Radiology Contrast Protocol - do NOT remove file path    Answer:   [email or web address]  . Urinalysis, Routine w reflex microscopic    Standing Status:   Standing    Number of Occurrences:   1  . CBC with Differential/Platelet    Standing Status:   Standing    Number of Occurrences:   1  . Comprehensive metabolic panel    Standing Status:   Standing    Number of Occurrences:   1  . Pregnancy, urine POC    Standing Status:   Standing    Number of Occurrences:   1  . Insert peripheral IV    Standing Status:   Standing    Number of  Occurrences:   1    Results for orders placed or performed during the hospital encounter of 07/18/18 (from the past 24 hour(s))  Urinalysis, Routine w reflex microscopic     Status: Abnormal   Collection Time: 07/18/18  5:55 AM  Result Value Ref Range   Color, Urine YELLOW YELLOW   APPearance CLOUDY (A) CLEAR   Specific Gravity, Urine 1.021 1.005 - 1.030   pH 6.0 5.0 - 8.0   Glucose, UA NEGATIVE NEGATIVE mg/dL   Hgb urine dipstick LARGE (A) NEGATIVE   Bilirubin Urine NEGATIVE NEGATIVE   Ketones, ur NEGATIVE NEGATIVE mg/dL   Protein, ur 30 (A) NEGATIVE mg/dL   Nitrite NEGATIVE NEGATIVE   Leukocytes, UA LARGE (A) NEGATIVE   RBC / HPF >50 (H) 0 - 5 RBC/hpf   WBC, UA >50 (H) 0 - 5 WBC/hpf   Bacteria, UA RARE (A) NONE SEEN   Squamous Epithelial / LPF 0-5 0 - 5   WBC Clumps PRESENT    Mucus PRESENT   CBC with Differential/Platelet     Status: Abnormal   Collection Time: 07/18/18  6:19 AM  Result Value Ref Range   WBC 7.9 4.0 - 10.5 K/uL   RBC 3.06 (L) 3.87 - 5.11 MIL/uL   Hemoglobin 8.0 (L) 12.0 - 15.0 g/dL   HCT 16.1 (L) 09.6 - 04.5 %   MCV 80.1 78.0 - 100.0 fL   MCH 26.1 26.0 - 34.0 pg   MCHC 32.7 30.0 - 36.0 g/dL   RDW 40.9 (H) 81.1 - 91.4 %   Platelets 264 150 - 400 K/uL   Neutrophils Relative % 81 %   Neutro Abs 6.4 1.7 - 7.7 K/uL   Lymphocytes Relative 13 %   Lymphs Abs 1.1 0.7 - 4.0 K/uL   Monocytes Relative 5 %   Monocytes Absolute 0.4 0.1 - 1.0 K/uL   Eosinophils Relative 1 %   Eosinophils Absolute 0.1 0.0 - 0.7 K/uL   Basophils Relative 0 %   Basophils Absolute 0.0 0.0 - 0.1 K/uL  Comprehensive metabolic panel     Status: Abnormal   Collection Time: 07/18/18  6:19 AM  Result Value  Ref Range   Sodium 135 135 - 145 mmol/L   Potassium 3.2 (L) 3.5 - 5.1 mmol/L   Chloride 103 98 - 111 mmol/L   CO2 19 (L) 22 - 32 mmol/L   Glucose, Bld 124 (H) 70 - 99 mg/dL   BUN 12 6 - 20 mg/dL   Creatinine, Ser 1.61 (H) 0.44 - 1.00 mg/dL   Calcium 8.4 (L) 8.9 - 10.3 mg/dL    Total Protein 6.7 6.5 - 8.1 g/dL   Albumin 3.0 (L) 3.5 - 5.0 g/dL   AST 16 15 - 41 U/L   ALT 22 0 - 44 U/L   Alkaline Phosphatase 93 38 - 126 U/L   Total Bilirubin 0.7 0.3 - 1.2 mg/dL   GFR calc non Af Amer >60 >60 mL/min   GFR calc Af Amer >60 >60 mL/min   Anion gap 13 5 - 15  Pregnancy, urine POC     Status: None   Collection Time: 07/18/18  6:21 AM  Result Value Ref Range   Preg Test, Ur NEGATIVE NEGATIVE   Meds ordered this encounter  Medications  . simethicone (MYLICON) chewable tablet 80 mg  . docusate (COLACE) 50 MG/5ML liquid 50 mg  . sodium phosphate (FLEET) 7-19 GM/118ML enema 1 enema  . oxyCODONE (Oxy IR/ROXICODONE) immediate release tablet 10 mg  . apixaban (ELIQUIS) tablet 5 mg  . lactated ringers bolus 1,000 mL  . morphine 4 MG/ML injection 4 mg    Report given to L. Earlene Plater, DO who assumes care of patient at this time.  Clayton Bibles, CNM 07/18/18  10:19 AM    Assessment and Plan   Assumption of Care: Resumed care from [facility]. Breifly, this is a 47yo [id number] who presents with worsening abdominal pain after a hydrothermal endometrial ablation 07/14/18 for abnormal uterine bleeding. 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 Has had 2 significant episodes of AUB, once in 2017 and then this episode which started in July of this year. Managed initially with OCPs and Megace as well as NSAIDs for pain relief. Unable to afford Megace so switched to Provera. Continued bleeding, so took Megace for 3 days. Went to [facility] ER for continued symptoms, HgB 6.1, given 2U pRBCs and admitted to hospital. Also received TXA. Switched to [employer] for affordability. No improvement so scheduled for endometrial ablation. Novasure was unable to be performed, so patient had hydrothermal ablation.  Since arrival to MAU, her pain has been difficult to control though she appears comfortable sitting in bed. She has received  oxycodone for her pain with minimal relief. Awaiting U/S results at this time. Lab work-up so far notable for persistent anemia with HgB 8 down from 8.6 on day of procedure. Also mildly hypokalemic at 3.2 and Cr increase of 1.05 from 0.84, a 25% increase. Had a temperature of 100.2 at home, some light vaginal bleeding.   Pelvic U/S: Limited evaluation because of post-procedural gas and limitations of abdominal evaluation. Left adnexal ovarian/tubal mass - 8.8x6.5x6.3 - combination of normal ovary, hemorrhage, and gas within tube related to recent procedure. Normal doppler flow.   On my exam, patient is tearful, sitting with hips flexed. Diffuse lower abdominal tenderness on exam, worse on right and periumbilical than left. No CVA tenderness, more lower back and buttock. Has had no pain relief. Will start IV, give morphine and IVF, and plan for CT scan A/P to evaluate for free pelvic fluid/blood, possible kidney stone.  Had CT scan last week with mild retroperitoneal LAD and moderate amount of hemorrhagic fluid in pelvis. Will add-on urine culture for U/A - only symptom is pressure no dysuria or frequency. Has had no pain relief from fleet enema. HgB stable at 8.0.   CT with fluid and gas in LLQ. Will consult with Dr. Debroah Loop for next steps in management.   Given continued pain and CT findings, DDX includes hemorrhagic cyst, pelvic abscess, diverticulitis, gas pain. Will admit for trending CBC, pain control.

## 2018-07-19 LAB — BASIC METABOLIC PANEL
Anion gap: 8 (ref 5–15)
BUN: 6 mg/dL (ref 6–20)
CO2: 21 mmol/L — ABNORMAL LOW (ref 22–32)
CREATININE: 0.88 mg/dL (ref 0.44–1.00)
Calcium: 8.3 mg/dL — ABNORMAL LOW (ref 8.9–10.3)
Chloride: 104 mmol/L (ref 98–111)
Glucose, Bld: 96 mg/dL (ref 70–99)
POTASSIUM: 3.6 mmol/L (ref 3.5–5.1)
SODIUM: 133 mmol/L — AB (ref 135–145)

## 2018-07-19 LAB — URINE CULTURE: Culture: NO GROWTH

## 2018-07-19 LAB — PREPARE RBC (CROSSMATCH)

## 2018-07-19 LAB — CBC
HCT: 23.8 % — ABNORMAL LOW (ref 36.0–46.0)
Hemoglobin: 7.6 g/dL — ABNORMAL LOW (ref 12.0–15.0)
MCH: 25.6 pg — ABNORMAL LOW (ref 26.0–34.0)
MCHC: 31.9 g/dL (ref 30.0–36.0)
MCV: 80.1 fL (ref 78.0–100.0)
PLATELETS: 323 10*3/uL (ref 150–400)
RBC: 2.97 MIL/uL — AB (ref 3.87–5.11)
RDW: 20.4 % — AB (ref 11.5–15.5)
WBC: 8.1 10*3/uL (ref 4.0–10.5)

## 2018-07-19 MED ORDER — SODIUM CHLORIDE 0.9% IV SOLUTION
Freq: Once | INTRAVENOUS | Status: AC
  Administered 2018-07-19: 16:00:00 via INTRAVENOUS

## 2018-07-19 MED ORDER — DIPHENHYDRAMINE HCL 25 MG PO CAPS
25.0000 mg | ORAL_CAPSULE | Freq: Once | ORAL | Status: AC
  Administered 2018-07-19: 25 mg via ORAL
  Filled 2018-07-19: qty 1

## 2018-07-19 MED ORDER — IBUPROFEN 600 MG PO TABS: 600.0000 mg | ORAL_TABLET | Freq: Three times a day (TID) | ORAL | Status: DC

## 2018-07-19 MED ORDER — IBUPROFEN 600 MG PO TABS
600.0000 mg | ORAL_TABLET | Freq: Three times a day (TID) | ORAL | Status: DC | PRN
  Administered 2018-07-19 – 2018-07-20 (×2): 600 mg via ORAL
  Filled 2018-07-19 (×3): qty 1

## 2018-07-19 NOTE — Progress Notes (Signed)
POD 5 s/p endometrial ablation  Subjective: Patient reports nausea, tolerating PO and no problems voiding.  Abdominal pain was better last night but she needed IV morphine this morning. Not dizzy but she is fatigued  Objective: I have reviewed patient's vital signs, medications, labs and radiology results. Blood pressure (!) 105/55, pulse 69, temperature 98.4 F (36.9 C), temperature source Oral, resp. rate 18, height 5' 1.5" (1.562 m), weight 78.7 kg, SpO2 98 %.  General: alert, cooperative and no distress GI: soft, non-tender; bowel sounds normal; no masses,  no organomegaly Vaginal Bleeding: none No current facility-administered medications on file prior to encounter.    Current Outpatient Medications on File Prior to Encounter  Medication Sig Dispense Refill  . acetaminophen (TYLENOL) 500 MG tablet Take 500 mg by mouth every 8 (eight) hours as needed for mild pain.    Marland Kitchen. apixaban (ELIQUIS) 5 MG TABS tablet Take 1 tablet (5 mg total) by mouth 2 (two) times daily. 60 tablet 0  . docusate sodium (COLACE) 100 MG capsule Take 1 capsule (100 mg total) by mouth 2 (two) times daily as needed for mild constipation or moderate constipation. 30 capsule 2  . ferrous sulfate 325 (65 FE) MG tablet Take 1 tablet (325 mg total) by mouth 2 (two) times daily with a meal. 60 tablet 3  . ibuprofen (ADVIL,MOTRIN) 600 MG tablet Take 1 tablet (600 mg total) by mouth every 6 (six) hours as needed for headache, mild pain, moderate pain or cramping. 60 tablet 2  . Magnesium 250 MG TABS Take 250 mg by mouth daily.    . megestrol (MEGACE) 40 MG tablet Take 2 tablets (80 mg total) by mouth 2 (two) times daily. 30 tablet 3  . ondansetron (ZOFRAN) 4 MG tablet Take 1 tablet (4 mg total) by mouth every 6 (six) hours as needed for nausea. 20 tablet 0  . polyethylene glycol (MIRALAX / GLYCOLAX) packet Take 17 g by mouth daily as needed for mild constipation.    . traMADol (ULTRAM) 50 MG tablet Take 1 tablet (50 mg total)  by mouth every 6 (six) hours as needed for severe pain (Postoperative pain). 30 tablet 0  . acetaminophen (TYLENOL) 325 MG tablet Take 2 tablets (650 mg total) by mouth every 6 (six) hours as needed for mild pain (or Fever >/= 101). (Patient not taking: Reported on 07/18/2018) 20 tablet 0  . Multiple Vitamins-Minerals (MULTIVITAMIN) tablet Take 1 tablet by mouth daily. (Patient not taking: Reported on 07/05/2018)    . simethicone (GAS-X) 80 MG chewable tablet Chew 2 tablets (160 mg total) by mouth every 6 (six) hours as needed (gas pain). (Patient not taking: Reported on 07/18/2018) 30 tablet 2    Assessment: POD 5 s/p endometrial ablation : postoperative left pelvic mass vs result of previous intraabdominal bleeding Abdominal pain Diverticulosis On Eliquis for DVT, with increased bleeding potential Plan: Encourage ambulation Pain management, observe for s/sx hemodynamic instability with anemia, check orthostatics  LOS: 0 days    Scheryl DarterJames Arnold 07/19/2018, 10:43 AM    Patient ID: Kimberly Stuart, female   DOB: 01/28/1971, 47 y.o.   MRN: [id number]

## 2018-07-20 DIAGNOSIS — K5909 Other constipation: Secondary | ICD-10-CM | POA: Diagnosis present

## 2018-07-20 DIAGNOSIS — R109 Unspecified abdominal pain: Secondary | ICD-10-CM | POA: Diagnosis present

## 2018-07-20 DIAGNOSIS — J9811 Atelectasis: Secondary | ICD-10-CM | POA: Diagnosis present

## 2018-07-20 DIAGNOSIS — Z86718 Personal history of other venous thrombosis and embolism: Secondary | ICD-10-CM | POA: Diagnosis not present

## 2018-07-20 DIAGNOSIS — R1032 Left lower quadrant pain: Secondary | ICD-10-CM | POA: Diagnosis not present

## 2018-07-20 DIAGNOSIS — E876 Hypokalemia: Secondary | ICD-10-CM | POA: Diagnosis present

## 2018-07-20 DIAGNOSIS — J90 Pleural effusion, not elsewhere classified: Secondary | ICD-10-CM | POA: Diagnosis present

## 2018-07-20 DIAGNOSIS — K651 Peritoneal abscess: Secondary | ICD-10-CM | POA: Diagnosis present

## 2018-07-20 DIAGNOSIS — D649 Anemia, unspecified: Secondary | ICD-10-CM | POA: Diagnosis present

## 2018-07-20 DIAGNOSIS — R161 Splenomegaly, not elsewhere classified: Secondary | ICD-10-CM | POA: Diagnosis present

## 2018-07-20 DIAGNOSIS — N736 Female pelvic peritoneal adhesions (postinfective): Secondary | ICD-10-CM | POA: Diagnosis present

## 2018-07-20 DIAGNOSIS — Z87410 Personal history of cervical dysplasia: Secondary | ICD-10-CM | POA: Diagnosis not present

## 2018-07-20 DIAGNOSIS — K5720 Diverticulitis of large intestine with perforation and abscess without bleeding: Secondary | ICD-10-CM | POA: Diagnosis present

## 2018-07-20 DIAGNOSIS — N739 Female pelvic inflammatory disease, unspecified: Secondary | ICD-10-CM | POA: Diagnosis not present

## 2018-07-20 DIAGNOSIS — I82402 Acute embolism and thrombosis of unspecified deep veins of left lower extremity: Secondary | ICD-10-CM | POA: Diagnosis present

## 2018-07-20 DIAGNOSIS — Z7901 Long term (current) use of anticoagulants: Secondary | ICD-10-CM | POA: Diagnosis not present

## 2018-07-20 LAB — BPAM RBC
BLOOD PRODUCT EXPIRATION DATE: 201910032359
Blood Product Expiration Date: 201910172359
ISSUE DATE / TIME: 201909181641
ISSUE DATE / TIME: 201909182011
Unit Type and Rh: 6200
Unit Type and Rh: 6200

## 2018-07-20 LAB — TYPE AND SCREEN
ABO/RH(D): A POS
Antibody Screen: NEGATIVE
UNIT DIVISION: 0
Unit division: 0

## 2018-07-20 LAB — HEMOGLOBIN AND HEMATOCRIT, BLOOD
HEMATOCRIT: 27.7 % — AB (ref 36.0–46.0)
HEMOGLOBIN: 9.1 g/dL — AB (ref 12.0–15.0)

## 2018-07-20 MED ORDER — BISACODYL 10 MG RE SUPP
10.0000 mg | Freq: Once | RECTAL | Status: AC
  Administered 2018-07-20: 10 mg via RECTAL
  Filled 2018-07-20: qty 1

## 2018-07-20 MED ORDER — LACTATED RINGERS IV SOLN
INTRAVENOUS | Status: DC
  Administered 2018-07-21: 75 mL/h via INTRAVENOUS
  Administered 2018-07-22 – 2018-07-24 (×3): via INTRAVENOUS

## 2018-07-20 MED ORDER — SODIUM CHLORIDE 0.9% FLUSH
3.0000 mL | Freq: Two times a day (BID) | INTRAVENOUS | Status: DC
  Administered 2018-07-20 – 2018-07-28 (×4): 3 mL via INTRAVENOUS

## 2018-07-20 MED ORDER — SODIUM CHLORIDE 0.9 % IV SOLN: 250.0000 mL | INTRAVENOUS | Status: DC | PRN

## 2018-07-20 MED ORDER — SODIUM CHLORIDE 0.9% FLUSH: 3.0000 mL | INTRAVENOUS | Status: DC | PRN

## 2018-07-20 MED ORDER — POLYETHYLENE GLYCOL 3350 17 G PO PACK
17.0000 g | PACK | Freq: Every day | ORAL | Status: DC
  Administered 2018-07-20 – 2018-07-23 (×3): 17 g via ORAL
  Filled 2018-07-20 (×4): qty 1

## 2018-07-20 NOTE — Anesthesia Preprocedure Evaluation (Addendum)
Anesthesia Evaluation  Patient identified by MRN, date of birth, ID band Patient awake    Reviewed: Allergy & Precautions, NPO status , Patient's Chart, lab work & pertinent test results  Airway Mallampati: II  TM Distance: >3 FB Neck ROM: Full    Dental no notable dental hx. (+) Teeth Intact, Dental Advisory Given   Pulmonary    Pulmonary exam normal breath sounds clear to auscultation       Cardiovascular + Peripheral Vascular Disease and + DVT (on eliquis)  Normal cardiovascular exam Rhythm:Regular Rate:Normal     Neuro/Psych negative neurological ROS  negative psych ROS   GI/Hepatic negative GI ROS, Neg liver ROS,   Endo/Other  negative endocrine ROS  Renal/GU negative Renal ROS     Musculoskeletal negative musculoskeletal ROS (+)   Abdominal   Peds  Hematology negative hematology ROS (+) anemia ,   Anesthesia Other Findings   Reproductive/Obstetrics                            Anesthesia Physical  Anesthesia Plan  ASA: III  Anesthesia Plan: General   Post-op Pain Management:    Induction: Intravenous  PONV Risk Score and Plan: 3 and Dexamethasone, Ondansetron and Midazolam  Airway Management Planned: Oral ETT  Additional Equipment:   Intra-op Plan:   Post-operative Plan: Extubation in OR  Informed Consent: I have reviewed the patients History and Physical, chart, labs and discussed the procedure including the risks, benefits and alternatives for the proposed anesthesia with the patient or authorized representative who has indicated his/her understanding and acceptance.   Dental advisory given  Plan Discussed with: CRNA  Anesthesia Plan Comments: (2 x PIV, +/- aline)       Anesthesia Quick Evaluation

## 2018-07-20 NOTE — Progress Notes (Addendum)
Subjective: Patient reports tolerating PO and no problems voiding.   States she has increased LLQ pain. She did not eat this morning Objective: I have reviewed patient's vital signs, medications and labs. Blood pressure 108/76, pulse 71, temperature 98.9 F (37.2 C), temperature source Oral, resp. rate 18, height 5' 1.5" (1.562 m), weight 78.7 kg, SpO2 97 %.  General: alert, cooperative and mild distress GI: abnormal findings:  moderate LLQ tenderness, soft Extremities: extremities normal, atraumatic, no cyanosis or edema Hemoglobin & Hematocrit     Component Value Date/Time   HGB 9.1 (L) 07/20/2018 0244   HGB 10.1 (L) 06/13/2018 1721   HCT 27.7 (L) 07/20/2018 0244   HCT 31.2 (L) 06/13/2018 1721     Assessment/Plan: Increased LLQ pain with LLQ mass possible hemorrhagic cyst NPO this morning will consider dx laparoscopy Discussed with Dr Macon LargeAnyanwu who managed the patient and performed surgery last week and we are in agreement that dx laparoscopy is indicated. Will hold Eliquis and schedule for tomorrow.  The risks of surgery were discussed in detail with the patient including but not limited to: bleeding which may require transfusion or reoperation; infection which may require prolonged hospitalization or re-hospitalization and antibiotic therapy; injury to bowel, bladder, ureters and major vessels or other surrounding organs; need for additional procedures including laparotomy; thromboembolic phenomenon, incisional problems and other postoperative or anesthesia complications.  Patient was told that the likelihood that her condition and symptoms will be treated effectively with this surgical management was very high; the postoperative expectations were also discussed in detail. The patient also understands the alternative treatment options which were discussed in full. All questions were answered.    Kimberly DarterJames Stuart 07/20/2018, 9:18 AM

## 2018-07-21 ENCOUNTER — Encounter (HOSPITAL_COMMUNITY): Payer: Self-pay | Admitting: Certified Registered"

## 2018-07-21 ENCOUNTER — Encounter (HOSPITAL_COMMUNITY)
Admission: AD | Disposition: A | Discharge status: Home / Self Care | Payer: Self-pay | Source: Home / Self Care | Attending: Obstetrics & Gynecology

## 2018-07-21 ENCOUNTER — Inpatient Hospital Stay (HOSPITAL_COMMUNITY): Payer: Medicaid Other | Admitting: Anesthesiology

## 2018-07-21 DIAGNOSIS — R1032 Left lower quadrant pain: Secondary | ICD-10-CM

## 2018-07-21 DIAGNOSIS — N739 Female pelvic inflammatory disease, unspecified: Secondary | ICD-10-CM

## 2018-07-21 HISTORY — PX: LAPAROSCOPY: SHX197

## 2018-07-21 LAB — GLUCOSE, CAPILLARY: GLUCOSE-CAPILLARY: 109 mg/dL — AB (ref 70–99)

## 2018-07-21 LAB — MRSA PCR SCREENING: MRSA by PCR: NEGATIVE

## 2018-07-21 SURGERY — LAPAROSCOPY, DIAGNOSTIC
Anesthesia: General

## 2018-07-21 MED ORDER — SUGAMMADEX SODIUM 200 MG/2ML IV SOLN
INTRAVENOUS | Status: DC | PRN
  Administered 2018-07-21: 200 mg via INTRAVENOUS

## 2018-07-21 MED ORDER — LIDOCAINE HCL (CARDIAC) PF 100 MG/5ML IV SOSY
PREFILLED_SYRINGE | INTRAVENOUS | Status: AC
  Filled 2018-07-21: qty 5

## 2018-07-21 MED ORDER — MEPERIDINE HCL 25 MG/ML IJ SOLN: 6.2500 mg | INTRAMUSCULAR | Status: DC | PRN

## 2018-07-21 MED ORDER — DEXAMETHASONE SODIUM PHOSPHATE 10 MG/ML IJ SOLN
INTRAMUSCULAR | Status: AC
  Filled 2018-07-21: qty 1

## 2018-07-21 MED ORDER — LACTATED RINGERS IV SOLN
INTRAVENOUS | Status: DC | PRN
  Administered 2018-07-21: 11:00:00 via INTRAVENOUS

## 2018-07-21 MED ORDER — ONDANSETRON HCL 4 MG/2ML IJ SOLN
4.0000 mg | Freq: Four times a day (QID) | INTRAMUSCULAR | Status: DC | PRN
  Administered 2018-07-22 – 2018-07-27 (×4): 4 mg via INTRAVENOUS
  Filled 2018-07-21: qty 2

## 2018-07-21 MED ORDER — PROPOFOL 10 MG/ML IV BOLUS
INTRAVENOUS | Status: AC
  Filled 2018-07-21: qty 20

## 2018-07-21 MED ORDER — BUPIVACAINE HCL (PF) 0.25 % IJ SOLN
INTRAMUSCULAR | Status: AC
  Filled 2018-07-21: qty 30

## 2018-07-21 MED ORDER — ROCURONIUM BROMIDE 100 MG/10ML IV SOLN
INTRAVENOUS | Status: AC
  Filled 2018-07-21: qty 1

## 2018-07-21 MED ORDER — FENTANYL CITRATE (PF) 100 MCG/2ML IJ SOLN
INTRAMUSCULAR | Status: AC
  Filled 2018-07-21: qty 2

## 2018-07-21 MED ORDER — HYDROMORPHONE HCL 1 MG/ML IJ SOLN
1.0000 mg | Freq: Once | INTRAMUSCULAR | Status: AC
  Administered 2018-07-21: 1 mg via INTRAVENOUS

## 2018-07-21 MED ORDER — BUPIVACAINE HCL (PF) 0.25 % IJ SOLN
INTRAMUSCULAR | Status: DC | PRN
  Administered 2018-07-21: 5 mL
  Administered 2018-07-21: 10 mL

## 2018-07-21 MED ORDER — FENTANYL CITRATE (PF) 100 MCG/2ML IJ SOLN
INTRAMUSCULAR | Status: DC | PRN
  Administered 2018-07-21: 50 ug via INTRAVENOUS
  Administered 2018-07-21: 100 ug via INTRAVENOUS
  Administered 2018-07-21: 50 ug via INTRAVENOUS

## 2018-07-21 MED ORDER — HYDROMORPHONE HCL 1 MG/ML IJ SOLN
INTRAMUSCULAR | Status: AC
  Administered 2018-07-21: 0.5 mg via INTRAVENOUS
  Filled 2018-07-21: qty 0.5

## 2018-07-21 MED ORDER — PIPERACILLIN-TAZOBACTAM 3.375 G IVPB
3.3750 g | Freq: Three times a day (TID) | INTRAVENOUS | Status: DC
  Administered 2018-07-21 – 2018-07-27 (×17): 3.375 g via INTRAVENOUS
  Filled 2018-07-21 (×18): qty 50

## 2018-07-21 MED ORDER — DEXAMETHASONE SODIUM PHOSPHATE 10 MG/ML IJ SOLN
INTRAMUSCULAR | Status: DC | PRN
  Administered 2018-07-21: 10 mg via INTRAVENOUS

## 2018-07-21 MED ORDER — SUGAMMADEX SODIUM 200 MG/2ML IV SOLN
INTRAVENOUS | Status: AC
  Filled 2018-07-21: qty 2

## 2018-07-21 MED ORDER — APIXABAN 5 MG PO TABS
5.0000 mg | ORAL_TABLET | Freq: Two times a day (BID) | ORAL | Status: DC
  Administered 2018-07-22 – 2018-07-24 (×6): 5 mg via ORAL
  Filled 2018-07-21 (×8): qty 1

## 2018-07-21 MED ORDER — HYDROMORPHONE HCL 1 MG/ML IJ SOLN
INTRAMUSCULAR | Status: AC
  Administered 2018-07-21: 1 mg via INTRAVENOUS
  Filled 2018-07-21: qty 1

## 2018-07-21 MED ORDER — HYDROMORPHONE HCL 1 MG/ML IJ SOLN
0.2500 mg | INTRAMUSCULAR | Status: DC | PRN
  Administered 2018-07-21: 0.5 mg via INTRAVENOUS
  Administered 2018-07-21: 1 mg via INTRAVENOUS
  Administered 2018-07-21: 0.5 mg via INTRAVENOUS

## 2018-07-21 MED ORDER — PROPOFOL 10 MG/ML IV BOLUS
INTRAVENOUS | Status: DC | PRN
  Administered 2018-07-21: 150 mg via INTRAVENOUS

## 2018-07-21 MED ORDER — PROMETHAZINE HCL 25 MG/ML IJ SOLN: 6.2500 mg | INTRAMUSCULAR | Status: DC | PRN

## 2018-07-21 MED ORDER — LIDOCAINE 2% (20 MG/ML) 5 ML SYRINGE
INTRAMUSCULAR | Status: DC | PRN
  Administered 2018-07-21: 40 mg via INTRAVENOUS

## 2018-07-21 MED ORDER — HYDROMORPHONE HCL 1 MG/ML IJ SOLN
INTRAMUSCULAR | Status: AC
  Filled 2018-07-21: qty 0.5

## 2018-07-21 MED ORDER — PIPERACILLIN-TAZOBACTAM 3.375 G IVPB 30 MIN
3.3750 g | Freq: Four times a day (QID) | INTRAVENOUS | Status: DC
  Administered 2018-07-21: 3.375 g via INTRAVENOUS
  Filled 2018-07-21 (×7): qty 50

## 2018-07-21 MED ORDER — ONDANSETRON HCL 4 MG PO TABS
4.0000 mg | ORAL_TABLET | Freq: Four times a day (QID) | ORAL | Status: DC | PRN
  Administered 2018-07-28: 4 mg via ORAL
  Filled 2018-07-21: qty 1

## 2018-07-21 MED ORDER — KETOROLAC TROMETHAMINE 30 MG/ML IJ SOLN: 30.0000 mg | Freq: Once | INTRAMUSCULAR | Status: DC | PRN

## 2018-07-21 MED ORDER — ONDANSETRON HCL 4 MG/2ML IJ SOLN
INTRAMUSCULAR | Status: DC | PRN
  Administered 2018-07-21: 4 mg via INTRAVENOUS

## 2018-07-21 MED ORDER — MIDAZOLAM HCL 5 MG/5ML IJ SOLN
INTRAMUSCULAR | Status: DC | PRN
  Administered 2018-07-21: 2 mg via INTRAVENOUS

## 2018-07-21 MED ORDER — MIDAZOLAM HCL 2 MG/2ML IJ SOLN
INTRAMUSCULAR | Status: AC
  Filled 2018-07-21: qty 2

## 2018-07-21 MED ORDER — ONDANSETRON HCL 4 MG/2ML IJ SOLN
INTRAMUSCULAR | Status: AC
  Filled 2018-07-21: qty 2

## 2018-07-21 MED ORDER — ROCURONIUM BROMIDE 10 MG/ML (PF) SYRINGE
PREFILLED_SYRINGE | INTRAVENOUS | Status: DC | PRN
  Administered 2018-07-21: 40 mg via INTRAVENOUS
  Administered 2018-07-21: 10 mg via INTRAVENOUS

## 2018-07-21 SURGICAL SUPPLY — 32 items
ADH SKN CLS APL DERMABOND .7 (GAUZE/BANDAGES/DRESSINGS)
BAG SPEC RTRVL LRG 6X4 10 (ENDOMECHANICALS)
CABLE HIGH FREQUENCY MONO STRZ (ELECTRODE) IMPLANT
CLOSURE WOUND 1/2 X4 (GAUZE/BANDAGES/DRESSINGS)
DERMABOND ADVANCED (GAUZE/BANDAGES/DRESSINGS)
DERMABOND ADVANCED .7 DNX12 (GAUZE/BANDAGES/DRESSINGS) IMPLANT
DRSG OPSITE POSTOP 3X4 (GAUZE/BANDAGES/DRESSINGS) IMPLANT
DURAPREP 26ML APPLICATOR (WOUND CARE) ×3 IMPLANT
GLOVE BIO SURGEON STRL SZ 6.5 (GLOVE) ×2 IMPLANT
GLOVE BIO SURGEONS STRL SZ 6.5 (GLOVE) ×1
GLOVE BIOGEL PI IND STRL 7.0 (GLOVE) ×4 IMPLANT
GLOVE BIOGEL PI INDICATOR 7.0 (GLOVE) ×8
GOWN STRL REUS W/TWL LRG LVL3 (GOWN DISPOSABLE) ×6 IMPLANT
NEEDLE INSUFFLATION 120MM (ENDOMECHANICALS) ×3 IMPLANT
NS IRRIG 1000ML POUR BTL (IV SOLUTION) ×3 IMPLANT
PACK LAPAROSCOPY BASIN (CUSTOM PROCEDURE TRAY) ×3 IMPLANT
PACK TRENDGUARD 450 HYBRID PRO (MISCELLANEOUS) IMPLANT
POUCH SPECIMEN RETRIEVAL 10MM (ENDOMECHANICALS) IMPLANT
PROTECTOR NERVE ULNAR (MISCELLANEOUS) ×6 IMPLANT
SET IRRIG TUBING LAPAROSCOPIC (IRRIGATION / IRRIGATOR) IMPLANT
SHEARS HARMONIC ACE PLUS 36CM (ENDOMECHANICALS) IMPLANT
SLEEVE XCEL OPT CAN 5 100 (ENDOMECHANICALS) ×5 IMPLANT
STRIP CLOSURE SKIN 1/2X4 (GAUZE/BANDAGES/DRESSINGS) IMPLANT
SUT VICRYL 0 UR6 27IN ABS (SUTURE) ×3 IMPLANT
SUT VICRYL 4-0 PS2 18IN ABS (SUTURE) ×3 IMPLANT
TOWEL OR 17X24 6PK STRL BLUE (TOWEL DISPOSABLE) ×6 IMPLANT
TRAY FOLEY W/BAG SLVR 14FR (SET/KITS/TRAYS/PACK) ×3 IMPLANT
TRENDGUARD 450 HYBRID PRO PACK (MISCELLANEOUS)
TROCAR XCEL DIL TIP R 11M (ENDOMECHANICALS) ×3 IMPLANT
TROCAR XCEL NON-BLD 5MMX100MML (ENDOMECHANICALS) ×3 IMPLANT
TUBING INSUF HEATED (TUBING) ×3 IMPLANT
WARMER LAPAROSCOPE (MISCELLANEOUS) ×3 IMPLANT

## 2018-07-21 NOTE — Progress Notes (Signed)
Subjective: Patient reports increased LLQ pain.    Objective: I have reviewed patient's vital signs, medications and labs. Blood pressure 129/70, pulse 93, temperature 98.9 F (37.2 C), temperature source Oral, resp. rate 18, height 5' 1.5" (1.562 m), weight 78.7 kg, SpO2 96 %.  General: alert, cooperative and moderate distress GI: soft, tender LLQ no mass or guarding Extremities: extremities normal, atraumatic, no cyanosis or edema CBC    Component Value Date/Time   WBC 8.1 07/19/2018 0546   RBC 2.97 (L) 07/19/2018 0546   HGB 9.1 (L) 07/20/2018 0244   HGB 10.1 (L) 06/13/2018 1721   HCT 27.7 (L) 07/20/2018 0244   HCT 31.2 (L) 06/13/2018 1721   PLT 323 07/19/2018 0546   PLT 253 06/13/2018 1721   MCV 80.1 07/19/2018 0546   MCV 88 06/13/2018 1721   MCH 25.6 (L) 07/19/2018 0546   MCHC 31.9 07/19/2018 0546   RDW 20.4 (H) 07/19/2018 0546   RDW 17.8 (H) 06/13/2018 1721   LYMPHSABS 1.1 07/18/2018 0619   MONOABS 0.4 07/18/2018 0619   EOSABS 0.1 07/18/2018 0619   BASOSABS 0.0 07/18/2018 0619   CMP Latest Ref Rng & Units 07/19/2018 07/18/2018 07/12/2018  Glucose 70 - 99 mg/dL 96 782(N124(H) 95  BUN 6 - 20 mg/dL 6 12 12   Creatinine 0.44 - 1.00 mg/dL 5.620.88 1.30(Q1.05(H) 6.570.84  Sodium 135 - 145 mmol/L 133(L) 135 138  Potassium 3.5 - 5.1 mmol/L 3.6 3.2(L) 3.7  Chloride 98 - 111 mmol/L 104 103 108  CO2 22 - 32 mmol/L 21(L) 19(L) 19(L)  Calcium 8.9 - 10.3 mg/dL 8.3(L) 8.4(L) 8.6(L)  Total Protein 6.5 - 8.1 g/dL - 6.7 -  Total Bilirubin 0.3 - 1.2 mg/dL - 0.7 -  Alkaline Phos 38 - 126 U/L - 93 -  AST 15 - 41 U/L - 16 -  ALT 0 - 44 U/L - 22 -       Assessment/Plan: Ready for OR laparoscopy possibly operative, patient signed consent and questions were answered   LOS: 1 day    Scheryl DarterJames Mitsuko Luera 07/21/2018, 10:19 AM

## 2018-07-21 NOTE — Op Note (Signed)
Diagnostic Laparoscopy Procedure Note  Indications: The patient is a 47 y.o. female with abdominal and left pelvic pain and left pelvic mass on CT.  Pre-operative Diagnosis: abdominal pain and pelvic mass  Post-operative Diagnosis: abdominal and pelvic bowel adhesions and possible left pelvic abscess, suspected diverticulitis  Surgeon: Scheryl DarterJames Arnold MD  Assistants: Nettie ElmMichael Ervin MD  Anesthesia: General endotracheal anesthesia  ASA Class: 3  Procedure Details  The patient was seen in the Holding Room. The risks, benefits, complications, treatment options, and expected outcomes were discussed with the patient. The possibilities of reaction to medication, pulmonary aspiration, perforation of viscus, bleeding, recurrent infection, the need for additional procedures, failure to diagnose a condition, and creating a complication requiring transfusion or operation were discussed with the patient. The patient concurred with the proposed plan, giving informed consent. The patient was taken to the Operating Room, identified as Kimberly Stuart and the procedure verified as Diagnostic Laparoscopy. A Time Out was held and the above information confirmed.  After induction of general anesthesia, the patient was placed in modified dorsal lithotomy position where she was prepped, draped, and catheterized in the normal, sterile fashion.  The cervix was visualized and an intrauterine manipulator was placed. A 1 cm umbilical incision was then performed. Veress needle was passed and pneumoperitoneum was established.  A 5 mm Optiview port was placed under direct vision.  Laparoscope was inserted and patient was placed in Trendelenburg position.  The above findings were noted.  There were adhesions of the bowel to the anterior abdominal wall in the pelvis and also to the dome of the uterus.  A dense adhesion was seen on the left side.  A 5 mm laparoscopic port was placed on the right side.  Laparoscopic shears were  used to lyse the filmy adhesions in the pelvis.  There were more dense adhesion of the bowel to the left side and was difficult to identify the adnexa on that side.  There were a few adhesions which were able to be lysed on the right side and the right tube and ovary were seen.  Small amount of purulent material was seen along the left pelvic sidewall where adhesions had been lysed.  A second 5 mm port was placed on the left side.  It was elected not to proceed with aggressive dissection in the left pelvis due to concern about possible bowel damage and presence of possible abscess.  Received Zosyn 3.75 mg IV during the procedure.  The pelvis was irrigated.  Hemostasis was assured.  Laparoscopic ports were removed under direct vision and CO2 was released.  Following the procedure the umbilical sheath was removed after intra-abdominal carbon dioxide was expressed. The incision was closed with subcutaneous and subcuticular sutures of 4-0 Vicryl.  Dermabond was applied at the incision sites.  The intrauterine manipulator was then removed.  Instrument, sponge, and needle counts were correct prior to abdominal closure and at the end of the procedure  Estimated Blood Loss:  less than 50 mL         Drains: Foley catheter         Total IV Fluids: 1500 mL         Specimens: None              Complications:  None; patient tolerated the procedure well.         Disposition: PACU - hemodynamically stable.         Condition: stable  Adam PhenixArnold, James G, MD 07/21/2018 12:33 PM

## 2018-07-21 NOTE — Transfer of Care (Signed)
Immediate Anesthesia Transfer of Care Note  Patient: Kimberly Stuart  Procedure(s) Performed: LAPAROSCOPY DIAGNOSTIC (N/A )  Patient Location: PACU  Anesthesia Type:General  Level of Consciousness: awake, oriented, drowsy and patient cooperative  Airway & Oxygen Therapy: Patient Spontanous Breathing and Patient connected to nasal cannula oxygen  Post-op Assessment: Report given to RN and Post -op Vital signs reviewed and stable  Post vital signs: Reviewed and stable  Last Vitals:  Vitals Value Taken Time  BP 113/64 07/21/2018 12:04 PM  Temp 37.4 C 07/21/2018 12:08 PM  Pulse 99 07/21/2018 12:09 PM  Resp 20 07/21/2018 12:09 PM  SpO2 99 % 07/21/2018 12:09 PM  Vitals shown include unvalidated device data.  Last Pain:  Vitals:   07/21/18 1208  TempSrc: Oral  PainSc:       Patients Stated Pain Goal: 4 (07/21/18 0954)  Complications: No apparent anesthesia complications

## 2018-07-21 NOTE — Progress Notes (Signed)
Patient requested to see MD. Crying in pain in abdomen and back after Morphine 2mg  given IV. Dr Erin FullingHarraway-Smith notified. order received and Dilaudid 1mg  IV given at 0536. Stayed in room with patient and she was asleep at 0541.

## 2018-07-21 NOTE — Anesthesia Procedure Notes (Signed)
Procedure Name: Intubation Date/Time: 07/21/2018 10:59 AM Performed by: Vista LawmanEargle, Taleigha Pinson E, CRNA Pre-anesthesia Checklist: Patient identified, Emergency Drugs available, Suction available and Patient being monitored Patient Re-evaluated:Patient Re-evaluated prior to induction Oxygen Delivery Method: Circle system utilized Preoxygenation: Pre-oxygenation with 100% oxygen Induction Type: IV induction Ventilation: Mask ventilation without difficulty Laryngoscope Size: Miller and 2 Grade View: Grade I Tube type: Oral Tube size: 7.0 mm Number of attempts: 1 Airway Equipment and Method: Stylet and Oral airway Placement Confirmation: ETT inserted through vocal cords under direct vision,  positive ETCO2 and breath sounds checked- equal and bilateral Secured at: 22 cm Tube secured with: Tape Dental Injury: Teeth and Oropharynx as per pre-operative assessment  Comments: Atraumatic, teeth/lips as pre op

## 2018-07-22 LAB — CBC
HCT: 33.1 % — ABNORMAL LOW (ref 36.0–46.0)
HEMOGLOBIN: 10.5 g/dL — AB (ref 12.0–15.0)
MCH: 25.5 pg — ABNORMAL LOW (ref 26.0–34.0)
MCHC: 31.7 g/dL (ref 30.0–36.0)
MCV: 80.5 fL (ref 78.0–100.0)
PLATELETS: 444 10*3/uL — AB (ref 150–400)
RBC: 4.11 MIL/uL (ref 3.87–5.11)
RDW: 19.6 % — ABNORMAL HIGH (ref 11.5–15.5)
WBC: 12.8 10*3/uL — ABNORMAL HIGH (ref 4.0–10.5)

## 2018-07-22 LAB — BASIC METABOLIC PANEL
Anion gap: 10 (ref 5–15)
BUN: 11 mg/dL (ref 6–20)
CHLORIDE: 104 mmol/L (ref 98–111)
CO2: 23 mmol/L (ref 22–32)
CREATININE: 0.89 mg/dL (ref 0.44–1.00)
Calcium: 8.7 mg/dL — ABNORMAL LOW (ref 8.9–10.3)
GFR calc Af Amer: >60 mL/min (ref >60)
Glucose, Bld: 150 mg/dL — ABNORMAL HIGH (ref 70–99)
Potassium: 4.9 mmol/L (ref 3.5–5.1)
SODIUM: 137 mmol/L (ref 135–145)

## 2018-07-22 NOTE — Anesthesia Postprocedure Evaluation (Signed)
Anesthesia Post Note  Patient: Kimberly HedgeMildred A Toledo  Procedure(s) Performed: LAPAROSCOPY DIAGNOSTIC (N/A )     Patient location during evaluation: [facility] Anesthesia Type: General Level of consciousness: awake and alert Pain management: pain level controlled Vital Signs Assessment: post-procedure vital signs reviewed and stable Respiratory status: spontaneous breathing Cardiovascular status: blood pressure returned to baseline Postop Assessment: no headache, patient able to bend at knees, no backache, no apparent nausea or vomiting, adequate PO intake and able to ambulate Anesthetic complications: no    Last Vitals:  Vitals:   07/21/18 2334 07/22/18 0350  BP:  112/62  Pulse: (!) 53 (!) 50  Resp:  17  Temp:  36.9 C  SpO2: 99% 98%    Last Pain:  Vitals:   07/22/18 0646  TempSrc:   PainSc: Asleep   Pain Goal: Patients Stated Pain Goal: 3 (07/22/18 0352)               Salome ArntSterling, Avantae Bither Marie

## 2018-07-22 NOTE — Anesthesia Postprocedure Evaluation (Signed)
Anesthesia Post Note  Patient: Kimberly Stuart  Procedure(s) Performed: LAPAROSCOPY DIAGNOSTIC (N/A )     Patient location during evaluation: PACU Anesthesia Type: General Level of consciousness: sedated and patient cooperative Pain management: pain level controlled Vital Signs Assessment: post-procedure vital signs reviewed and stable Respiratory status: spontaneous breathing Cardiovascular status: stable Anesthetic complications: no    Last Vitals:  Vitals:   07/21/18 2334 07/22/18 0350  BP:  112/62  Pulse: (!) 53 (!) 50  Resp:  17  Temp:  36.9 C  SpO2: 99% 98%    Last Pain:  Vitals:   07/22/18 0646  TempSrc:   PainSc: Asleep   Pain Goal: Patients Stated Pain Goal: 3 (07/22/18 0352)               Lewie LoronJohn Sevastian Witczak

## 2018-07-22 NOTE — Progress Notes (Signed)
Subjective: Patient reports decreased LLQ pain after being on antibiotics.  Pain medications as needed.     Objective: I have reviewed patient's vital signs, medications and labs. Patient Vitals for the past 24 hrs:  BP Temp Temp src Pulse Resp SpO2  07/22/18 1205 121/73 98.4 F (36.9 C) Oral (!) 44 18 97 %  07/22/18 0821 106/60 98.3 F (36.8 C) Oral (!) 47 18 97 %  07/22/18 0350 112/62 98.5 F (36.9 C) Oral (!) 50 17 98 %  07/21/18 2334 - - - (!) 53 - 99 %  07/21/18 2330 110/64 98.9 F (37.2 C) Oral (!) 51 16 95 %  07/21/18 2018 115/68 98.6 F (37 C) Oral 64 18 99 %  07/21/18 1600 111/69 97.9 F (36.6 C) - 61 18 99 %   General: alert, cooperative and moderate distress GI: soft, mild tenderness LLQ, no mass or guarding Extremities: extremities normal, atraumatic, no cyanosis or edema  CBC    Component Value Date/Time   WBC 12.8 (H) 07/22/2018 0633   RBC 4.11 07/22/2018 0633   HGB 10.5 (L) 07/22/2018 0633   HGB 10.1 (L) 06/13/2018 1721   HCT 33.1 (L) 07/22/2018 0633   HCT 31.2 (L) 06/13/2018 1721   PLT 444 (H) 07/22/2018 0633   PLT 253 06/13/2018 1721   MCV 80.5 07/22/2018 0633   MCV 88 06/13/2018 1721   MCH 25.5 (L) 07/22/2018 0633   MCHC 31.7 07/22/2018 0633   RDW 19.6 (H) 07/22/2018 0633   RDW 17.8 (H) 06/13/2018 1721   LYMPHSABS 1.1 07/18/2018 0619   MONOABS 0.4 07/18/2018 0619   EOSABS 0.1 07/18/2018 0619   BASOSABS 0.0 07/18/2018 0619   CMP Latest Ref Rng & Units 07/22/2018 07/19/2018 07/18/2018  Glucose 70 - 99 mg/dL 981(X150(H) 96 914(N124(H)  BUN 6 - 20 mg/dL 11 6 12   Creatinine 0.44 - 1.00 mg/dL 8.290.89 5.620.88 1.30(Q1.05(H)  Sodium 135 - 145 mmol/L 137 133(L) 135  Potassium 3.5 - 5.1 mmol/L 4.9 3.6 3.2(L)  Chloride 98 - 111 mmol/L 104 104 103  CO2 22 - 32 mmol/L 23 21(L) 19(L)  Calcium 8.9 - 10.3 mg/dL 6.5(H8.7(L) 8.3(L) 8.4(L)  Total Protein 6.5 - 8.1 g/dL - - 6.7  Total Bilirubin 0.3 - 1.2 mg/dL - - 0.7  Alkaline Phos 38 - 126 U/L - - 93  AST 15 - 41 U/L - - 16  ALT 0 - 44  U/L - - 22   Assessment/Plan: POD#1 s/p diagnostic laparoscopy, showed abdominal and pelvic bowel adhesions and possible left pelvic abscess, suspected diverticulitis.  Patient will be on Zosyn until at least 48 hours afebrile (had temp of 100.7 at 1100 9/20). Will continue pain medications as needed, OOB. Continue home meds including Eliquis for anticoagulation. Encourage OOB. Routine postoperative care.    LOS: 2 days    Jaynie CollinsUgonna Odessie Polzin, MD 07/22/2018, 3:31 PM

## 2018-07-23 ENCOUNTER — Encounter (HOSPITAL_COMMUNITY): Payer: Self-pay | Admitting: Obstetrics & Gynecology

## 2018-07-23 DIAGNOSIS — K5790 Diverticulosis of intestine, part unspecified, without perforation or abscess without bleeding: Secondary | ICD-10-CM | POA: Insufficient documentation

## 2018-07-23 DIAGNOSIS — K651 Peritoneal abscess: Secondary | ICD-10-CM

## 2018-07-23 HISTORY — DX: Peritoneal abscess: K65.1

## 2018-07-23 MED ORDER — BISACODYL 10 MG RE SUPP
10.0000 mg | Freq: Once | RECTAL | Status: AC
  Administered 2018-07-23: 10 mg via RECTAL
  Filled 2018-07-23: qty 1

## 2018-07-23 MED ORDER — CYCLOBENZAPRINE HCL 10 MG PO TABS
10.0000 mg | ORAL_TABLET | Freq: Three times a day (TID) | ORAL | Status: DC | PRN
  Administered 2018-07-23 – 2018-07-25 (×3): 10 mg via ORAL
  Filled 2018-07-23 (×4): qty 1

## 2018-07-23 MED ORDER — HYDROMORPHONE HCL 1 MG/ML IJ SOLN
1.0000 mg | INTRAMUSCULAR | Status: DC | PRN
  Administered 2018-07-23: 1 mg via INTRAVENOUS
  Administered 2018-07-23: 2 mg via INTRAVENOUS
  Administered 2018-07-24 – 2018-07-27 (×9): 1 mg via INTRAVENOUS
  Filled 2018-07-23: qty 1
  Filled 2018-07-23: qty 2
  Filled 2018-07-23 (×8): qty 1

## 2018-07-23 NOTE — Progress Notes (Signed)
Subjective: Patient reports overall decreased LLQ pain after being on antibiotics. But had more pain especially back pain early this morning, no pain medication yet. Patient is frustrated, asking to see GI specialist to discuss long term management of bowel inflammation/diverticulitis.     Objective: I have reviewed patient's vital signs, medications and labs. Patient Vitals for the past 24 hrs:  BP Temp Temp src Pulse Resp SpO2  07/23/18 0556 103/64 98.1 F (36.7 C) Oral (!) 47 16 99 %  07/23/18 0035 104/74 98.2 F (36.8 C) Oral (!) 51 18 100 %  07/22/18 1941 (!) 96/49 98 F (36.7 C) Oral 64 20 100 %  07/22/18 1845 - - - (!) 55 - -  07/22/18 1711 104/67 97.8 F (36.6 C) Oral (!) 42 18 99 %  07/22/18 1205 121/73 98.4 F (36.9 C) Oral (!) 44 18 97 %  07/22/18 0821 106/60 98.3 F (36.8 C) Oral (!) 47 18 97 %   General: alert, cooperative and moderate distress GI: soft, mild tenderness LLQ, no mass or guarding Extremities: extremities normal, atraumatic, no cyanosis or edema  CBC    Component Value Date/Time   WBC 12.8 (H) 07/22/2018 0633   RBC 4.11 07/22/2018 0633   HGB 10.5 (L) 07/22/2018 0633   HGB 10.1 (L) 06/13/2018 1721   HCT 33.1 (L) 07/22/2018 0633   HCT 31.2 (L) 06/13/2018 1721   PLT 444 (H) 07/22/2018 0633   PLT 253 06/13/2018 1721   MCV 80.5 07/22/2018 0633   MCV 88 06/13/2018 1721   MCH 25.5 (L) 07/22/2018 0633   MCHC 31.7 07/22/2018 0633   RDW 19.6 (H) 07/22/2018 0633   RDW 17.8 (H) 06/13/2018 1721   LYMPHSABS 1.1 07/18/2018 0619   MONOABS 0.4 07/18/2018 0619   EOSABS 0.1 07/18/2018 0619   BASOSABS 0.0 07/18/2018 0619   CMP Latest Ref Rng & Units 07/22/2018 07/19/2018 07/18/2018  Glucose 70 - 99 mg/dL 161(W150(H) 96 960(A124(H)  BUN 6 - 20 mg/dL 11 6 12   Creatinine 0.44 - 1.00 mg/dL 5.400.89 9.810.88 1.91(Y1.05(H)  Sodium 135 - 145 mmol/L 137 133(L) 135  Potassium 3.5 - 5.1 mmol/L 4.9 3.6 3.2(L)  Chloride 98 - 111 mmol/L 104 104 103  CO2 22 - 32 mmol/L 23 21(L) 19(L)  Calcium  8.9 - 10.3 mg/dL 7.8(G8.7(L) 8.3(L) 8.4(L)  Total Protein 6.5 - 8.1 g/dL - - 6.7  Total Bilirubin 0.3 - 1.2 mg/dL - - 0.7  Alkaline Phos 38 - 126 U/L - - 93  AST 15 - 41 U/L - - 16  ALT 0 - 44 U/L - - 22   Assessment/Plan: POD#2 s/p diagnostic laparoscopy, showed abdominal and pelvic bowel adhesions and possible left pelvic abscess, suspected diverticulitis.  Continue Zosynfor now ( last fever  of 100.7 at 1100 9/20). Will continue pain medications as needed, OOB. Continue home meds including Eliquis for anticoagulation. Encourage OOB. Routine postoperative care.  Flexeril ordered for back pain. Will talk to oncoming team, need to consult GI or Gen Surgery to talk to patient about long term management of diverticulitis/bowel inflammation. She was assured that we are doing the correct acute treatment for this with antibiotics and analgesia, no further imaging studies indicated at this point until the inflammation lessens.     LOS: 3 days    Kimberly CollinsUgonna Anyanwu, MD 07/23/2018, 7:52 AM

## 2018-07-23 NOTE — Progress Notes (Signed)
Called Dr. Emelda FearFerguson about patient's unremitting pain after receiving Percocet and Flexeril. He stated he would come up to evaluate her.

## 2018-07-23 NOTE — Progress Notes (Signed)
2 Days Post-Op Procedure(s) (LRB): LAPAROSCOPY DIAGNOSTIC (N/A)  Subjective: Patient reports severe crampy abdominal pain, + flatus, + BM and no problems voiding.  No vomiting. Had a small BM this morning. Ate lunch. Pain comes in brief waves   Objective: I have reviewed patient's vital signs, medications and labs. Temp:  [98 F (36.7 C)-98.2 F (36.8 C)] 98.1 F (36.7 C) (09/22 1616) Pulse Rate:  [47-64] 62 (09/22 1616) Resp:  [16-20] 20 (09/22 1616) BP: (96-114)/(49-76) 114/76 (09/22 1616) SpO2:  [99 %-100 %] 99 % (09/22 1616)  Physical Examination: General appearance - in mild to moderate distress Chest - clear to auscultation, no wheezes, rales or rhonchi, symmetric air entry Abdomen - soft, nontender, nondistended, no masses or organomegaly bowel sounds hyperactive scars from previous incisions clean dry. Extremities - peripheral pulses normal, no pedal edema, no clubbing or cyanosis, Homan's sign negative bilaterally   Assessment: s/p Procedure(s): LAPAROSCOPY DIAGNOSTIC (N/A): hyperperistalsis, probaby of  small bowel ,   Plan: dulcolax suppos to help colon respond.  IV analgesics, po flexeril percocet   LOS: 3 days    Kimberly BurrowJohn V Zyire Stuart 07/23/2018, 6:01 PM

## 2018-07-24 ENCOUNTER — Encounter (HOSPITAL_COMMUNITY): Payer: Self-pay | Admitting: Obstetrics and Gynecology

## 2018-07-24 ENCOUNTER — Inpatient Hospital Stay (HOSPITAL_COMMUNITY): Payer: Medicaid Other

## 2018-07-24 LAB — CBC WITH DIFFERENTIAL/PLATELET
Basophils Absolute: 0 10*3/uL (ref 0.0–0.1)
Basophils Relative: 0 %
EOS ABS: 0.1 10*3/uL (ref 0.0–0.7)
EOS PCT: 1 %
HCT: 31.2 % — ABNORMAL LOW (ref 36.0–46.0)
Hemoglobin: 10.2 g/dL — ABNORMAL LOW (ref 12.0–15.0)
LYMPHS PCT: 12 %
Lymphs Abs: 1.4 10*3/uL (ref 0.7–4.0)
MCH: 26.5 pg (ref 26.0–34.0)
MCHC: 32.7 g/dL (ref 30.0–36.0)
MCV: 81 fL (ref 78.0–100.0)
MONO ABS: 0.7 10*3/uL (ref 0.1–1.0)
Monocytes Relative: 7 %
Neutro Abs: 8.8 10*3/uL — ABNORMAL HIGH (ref 1.7–7.7)
Neutrophils Relative %: 80 %
PLATELETS: 456 10*3/uL — AB (ref 150–400)
RBC: 3.85 MIL/uL — AB (ref 3.87–5.11)
RDW: 20 % — AB (ref 11.5–15.5)
WBC: 11 10*3/uL — AB (ref 4.0–10.5)

## 2018-07-24 LAB — GASTROINTESTINAL PANEL BY PCR, STOOL (REPLACES STOOL CULTURE)
Adenovirus F40/41: NOT DETECTED
Astrovirus: NOT DETECTED
CAMPYLOBACTER SPECIES: NOT DETECTED
CRYPTOSPORIDIUM: NOT DETECTED
Cyclospora cayetanensis: NOT DETECTED
Entamoeba histolytica: NOT DETECTED
Enteroaggregative E coli (EAEC): NOT DETECTED
Enteropathogenic E coli (EPEC): NOT DETECTED
Enterotoxigenic E coli (ETEC): NOT DETECTED
Giardia lamblia: NOT DETECTED
Norovirus GI/GII: NOT DETECTED
PLESIMONAS SHIGELLOIDES: NOT DETECTED
ROTAVIRUS A: NOT DETECTED
SALMONELLA SPECIES: NOT DETECTED
SHIGA LIKE TOXIN PRODUCING E COLI (STEC): NOT DETECTED
SHIGELLA/ENTEROINVASIVE E COLI (EIEC): NOT DETECTED
Sapovirus (I, II, IV, and V): NOT DETECTED
Vibrio cholerae: NOT DETECTED
Vibrio species: NOT DETECTED
YERSINIA ENTEROCOLITICA: NOT DETECTED

## 2018-07-24 LAB — BASIC METABOLIC PANEL
ANION GAP: 10 (ref 5–15)
BUN: 9 mg/dL (ref 6–20)
CALCIUM: 8.4 mg/dL — AB (ref 8.9–10.3)
CO2: 24 mmol/L (ref 22–32)
Chloride: 103 mmol/L (ref 98–111)
Creatinine, Ser: 0.92 mg/dL (ref 0.44–1.00)
GLUCOSE: 89 mg/dL (ref 70–99)
POTASSIUM: 4.2 mmol/L (ref 3.5–5.1)
Sodium: 137 mmol/L (ref 135–145)

## 2018-07-24 LAB — C DIFFICILE QUICK SCREEN W PCR REFLEX
C DIFFICILE (CDIFF) INTERP: NOT DETECTED
C DIFFICILE (CDIFF) TOXIN: NEGATIVE
C DIFFICLE (CDIFF) ANTIGEN: NEGATIVE

## 2018-07-24 LAB — SEDIMENTATION RATE: SED RATE: 72 mm/h — AB (ref 0–22)

## 2018-07-24 MED ORDER — IOPAMIDOL (ISOVUE-300) INJECTION 61%
100.0000 mL | Freq: Once | INTRAVENOUS | Status: AC | PRN
  Administered 2018-07-24: 100 mL via INTRAVENOUS

## 2018-07-24 MED ORDER — IOPAMIDOL (ISOVUE-300) INJECTION 61%
30.0000 mL | Freq: Once | INTRAVENOUS | Status: AC | PRN
  Administered 2018-07-24: 30 mL via ORAL

## 2018-07-24 MED ORDER — POLYETHYLENE GLYCOL 3350 17 G PO PACK: 17.0000 g | PACK | Freq: Every day | ORAL | Status: DC | PRN

## 2018-07-24 MED ORDER — SODIUM CHLORIDE 0.9 % IV SOLN
INTRAVENOUS | Status: AC
  Administered 2018-07-24: 14:00:00 via INTRAVENOUS

## 2018-07-24 NOTE — Progress Notes (Addendum)
D/w [facility] and they recommend rpt ct a/p with po and iv contrast and will come by and see. Has had borderline Cr levels so I will add on a bmp to her am labs and if normal do the CT with contrast.  Kimberly Stuart, Jr MD Attending Center [facility] 07/24/2018 Time: 1153am   Cr. 0.92 GFR >60 and always has been. [facility] and shouldn't be an issue for IV and PO contrast CT today  Kimberly Stuart, Jr MD Attending Center for [facility] 07/24/2018 Time: [phone]

## 2018-07-24 NOTE — Consult Note (Addendum)
Reason for Consult:  Referring Physician: C Pickens  Kimberly Stuart is an 47 y.o. female.  HPI: Pt is a 48 y/o female with abnormal uterine bleeding show underwent Hysteroscopy, Hydrothermal Endometrial Ablation (after failed Novasure ablation attempt)by  Dr. Verita Schneiders on 07/14/18.    She went home after the procedure and returned to the ED at [facility] on 07/18/18, with abdominal pain that was progressively worse, low grade temperature 100.6.  K+ was 3.2, Creatinine up to 1.05.  H/H was down to 8.0/24.5 >> from 9.4/29.1 on 07/13/18.  She was transfused on 07/11/18, and again on 07/19/18.  Nothing at home made her pain better or worse.  She was eating but not much. Since readmit on 9/17,  pain medicines and heating pad have been the best Rx for her pain.    CT WO contrast on readmit 07/18/18: gas in the endometrial canal, large gas/fluid collection left adnexa measuring 6.7 x 6.1 cm, possible pelvic abscess.  She also had some mild inflammatory changes around the sigmoid.    They could not tell if this was an abscess or not.  Ultrasound showed an even larger 8.8 x 6.5 x 6.3 cm mass with gas in the left ovarian area, it was thought this could represent Normal ovary, hemorrhage and gas within the tube.  She was on Eliquis for DVT.  She thinks she has been on anticoagulation since August whe her DVT was found and after she was started on BCP for her abnormal uterine bleeding.   Eliquis was held and she was taken to the OR on 07/21/18:  She underwent diagnostic laparoscopy. She was found to have adhesion on the right.  Small amount of purulent material was seen along the left pelvic sidewall where adhesions had been lysed.  A second 5 mm port was placed on the left side.  It was elected not to proceed with aggressive dissection in the left pelvis due to concern about possible bowel damage and presence of possible abscess. She was started on Zosyn at this time, she continues to have pain which is pretty  constant.  Mild nausea and taking very little PM, last stool yesterday was watery and black.  She remains afebrile, VSS,BMP is normal, WBC remains elevated from this admission, but slightly improved.  C diff is negative.  Dr. Barry Dienes has discussed the new CT just completed today with Radiology. It is their opinion the fluid collection and air is related to the adnexal  structure with some secondary inflammation of the sigmoid colon.  "8.4 x 4.6 cm irregular fluid collection is seen in the left adnexal region most consistent with pelvic abscess."  Past Medical History:  Diagnosis Date  . Anemia   . Vitamin D deficiency   . VTE (venous thromboembolism) 2019   left calf. after OCP used for AUB    Past Surgical History:  Procedure Laterality Date  . DILITATION & CURRETTAGE/HYSTROSCOPY WITH NOVASURE ABLATION N/A 07/14/2018   Procedure: Attempted Novasure Ablation;  Surgeon: Osborne Oman, MD;  Location: [place] [facility];  Service: Gynecology;  Laterality: N/A;  . HYSTEROSCOPY N/A 07/14/2018   Procedure: HYSTEROSCOPY WITH HYDROTHERMAL ABLATION;  Surgeon: Osborne Oman, MD;  Location: [facility] [facility];  Service: Gynecology;  Laterality: N/A;  . LAPAROSCOPY N/A 07/21/2018   Procedure: LAPAROSCOPY DIAGNOSTIC;  Surgeon: Woodroe Mode, MD;  Location: [facility] [facility];  Service: Gynecology;  Laterality: N/A;  . WISDOM TOOTH EXTRACTION      History reviewed. No pertinent family history.  Social  History:  reports that she has never smoked. She has never used smokeless tobacco. She reports that she does not drink alcohol or use drugs.  Allergies:  Allergies  Allergen Reactions  . Estrogens Other (See Comments)    DVT   Anti-infectives (From admission, onward)   Start     Dose/Rate Route Frequency Ordered Stop   07/21/18 2000  piperacillin-tazobactam (ZOSYN) IVPB 3.375 g     3.375 g 12.5 mL/hr over 240 Minutes Intravenous Every 8 hours 07/21/18 1513     07/21/18 1200  piperacillin-tazobactam (ZOSYN) IVPB 3.375 g   Status:  Discontinued     3.375 g 100 mL/hr over 30 Minutes Intravenous Every 6 hours 07/21/18 1140 07/21/18 1513     Medications:  Prior to Admission:  Medications Prior to Admission  Medication Sig Dispense Refill Last Dose  . acetaminophen (TYLENOL) 500 MG tablet Take 500 mg by mouth every 8 (eight) hours as needed for mild pain.   07/17/2018 at Unknown time  . apixaban (ELIQUIS) 5 MG TABS tablet Take 1 tablet (5 mg total) by mouth 2 (two) times daily. 60 tablet 0 07/17/2018 at Unknown time  . docusate sodium (COLACE) 100 MG capsule Take 1 capsule (100 mg total) by mouth 2 (two) times daily as needed for mild constipation or moderate constipation. 30 capsule 2 Past Week at Unknown time  . ferrous sulfate 325 (65 FE) MG tablet Take 1 tablet (325 mg total) by mouth 2 (two) times daily with a meal. 60 tablet 3 07/17/2018 at Unknown time  . ibuprofen (ADVIL,MOTRIN) 600 MG tablet Take 1 tablet (600 mg total) by mouth every 6 (six) hours as needed for headache, mild pain, moderate pain or cramping. 60 tablet 2 07/17/2018 at Unknown time  . Magnesium 250 MG TABS Take 250 mg by mouth daily.   07/17/2018 at Unknown time  . megestrol (MEGACE) 40 MG tablet Take 2 tablets (80 mg total) by mouth 2 (two) times daily. 30 tablet 3 07/17/2018 at Unknown time  . ondansetron (ZOFRAN) 4 MG tablet Take 1 tablet (4 mg total) by mouth every 6 (six) hours as needed for nausea. 20 tablet 0 07/17/2018 at Unknown time  . polyethylene glycol (MIRALAX / GLYCOLAX) packet Take 17 g by mouth daily as needed for mild constipation.   07/17/2018 at Unknown time  . traMADol (ULTRAM) 50 MG tablet Take 1 tablet (50 mg total) by mouth every 6 (six) hours as needed for severe pain (Postoperative pain). 30 tablet 0 07/17/2018 at Unknown time  . acetaminophen (TYLENOL) 325 MG tablet Take 2 tablets (650 mg total) by mouth every 6 (six) hours as needed for mild pain (or Fever >/= 101). (Patient not taking: Reported on 07/18/2018) 20 tablet 0 Not  Taking at Unknown time  . Multiple Vitamins-Minerals (MULTIVITAMIN) tablet Take 1 tablet by mouth daily. (Patient not taking: Reported on 07/05/2018)   Not Taking at Unknown time  . simethicone (GAS-X) 80 MG chewable tablet Chew 2 tablets (160 mg total) by mouth every 6 (six) hours as needed (gas pain). (Patient not taking: Reported on 07/18/2018) 30 tablet 2 Not Taking at Unknown time   Scheduled: . apixaban  5 mg Oral BID  . megestrol  80 mg Oral BID  . sodium chloride flush  3 mL Intravenous Q12H   Continuous: . sodium chloride    . sodium chloride 75 mL/hr at 07/24/18 1409  . lactated ringers 10 mL/hr at 07/24/18 0315  . piperacillin-tazobactam 3.375 g (07/24/18 1127)  Anti-infectives (From admission, onward)   Start     Dose/Rate Route Frequency Ordered Stop   07/21/18 2000  piperacillin-tazobactam (ZOSYN) IVPB 3.375 g     3.375 g 12.5 mL/hr over 240 Minutes Intravenous Every 8 hours 07/21/18 1513     07/21/18 1200  piperacillin-tazobactam (ZOSYN) IVPB 3.375 g  Status:  Discontinued     3.375 g 100 mL/hr over 30 Minutes Intravenous Every 6 hours 07/21/18 1140 07/21/18 1513      Results for orders placed or performed during the hospital encounter of 07/18/18 (from the past 48 hour(s))  CBC with Differential/Platelet     Status: Abnormal   Collection Time: 07/24/18  5:23 AM  Result Value Ref Range   WBC 11.0 (H) 4.0 - 10.5 K/uL   RBC 3.85 (L) 3.87 - 5.11 MIL/uL   Hemoglobin 10.2 (L) 12.0 - 15.0 g/dL   HCT 31.2 (L) 36.0 - 46.0 %   MCV 81.0 78.0 - 100.0 fL   MCH 26.5 26.0 - 34.0 pg   MCHC 32.7 30.0 - 36.0 g/dL   RDW 20.0 (H) 11.5 - 15.5 %   Platelets 456 (H) 150 - 400 K/uL   Neutrophils Relative % 80 %   Neutro Abs 8.8 (H) 1.7 - 7.7 K/uL   Lymphocytes Relative 12 %   Lymphs Abs 1.4 0.7 - 4.0 K/uL   Monocytes Relative 7 %   Monocytes Absolute 0.7 0.1 - 1.0 K/uL   Eosinophils Relative 1 %   Eosinophils Absolute 0.1 0.0 - 0.7 K/uL   Basophils Relative 0 %   Basophils  Absolute 0.0 0.0 - 0.1 K/uL    Comment: Performed at [facility], [address] [place], [place] [address]  Sedimentation rate     Status: Abnormal   Collection Time: 07/24/18  5:23 AM  Result Value Ref Range   Sed Rate 72 (H) 0 - 22 mm/hr    Comment: PERFORMED AT [facility] Performed at [facility], [address] [place], [place] [address]   Basic metabolic panel     Status: Abnormal   Collection Time: 07/24/18  5:23 AM  Result Value Ref Range   Sodium 137 135 - 145 mmol/L   Potassium 4.2 3.5 - 5.1 mmol/L   Chloride 103 98 - 111 mmol/L   CO2 24 22 - 32 mmol/L   Glucose, Bld 89 70 - 99 mg/dL   BUN 9 6 - 20 mg/dL   Creatinine, Ser 0.92 0.44 - 1.00 mg/dL   Calcium 8.4 (L) 8.9 - 10.3 mg/dL   GFR calc non Af Amer >60 >60 mL/min   GFR calc Af Amer >60 >60 mL/min    Comment: (NOTE) The eGFR has been calculated using the CKD EPI equation. This calculation has not been validated in all clinical situations. eGFR's persistently <60 mL/min signify possible Chronic Kidney Disease.    Anion gap 10 5 - 15    Comment: Performed at [facility], [place], [place] [address]  C difficile quick scan w PCR reflex     Status: None   Collection Time: 07/24/18 10:40 AM  Result Value Ref Range   C Diff antigen NEGATIVE NEGATIVE   C Diff toxin NEGATIVE NEGATIVE   C Diff interpretation No C. difficile detected.     Comment: Performed at [facility], [place] [address], [place], [place] [address]    No results found.  Review of Systems  Constitutional: Positive for fever (just one  fever) and weight loss (not sure/ no appetitie).  HENT: Negative.   Eyes: Negative.   Respiratory: Negative.   Cardiovascular: Positive for leg swelling (hx of DVT 06/2018  Xarelto to Eliquis before surgery initial surgery.). Negative for chest pain, palpitations, orthopnea, claudication and PND.  Gastrointestinal: Positive for abdominal pain (across the entire abdomen),  constipation (Chronic constipation before surgery averages 1 hard BM weekly before primary surgery.), diarrhea (last 3 day - C Diff negative) and nausea (take Zofran before pain meds, worried about nausea). Negative for blood in stool, heartburn, melena and vomiting.       No family or prior hx of Diverticulosis or diverticulitis.  No colonoscopy  Genitourinary: Negative.   Musculoskeletal: Positive for back pain (started after endometrial procedure). Negative for falls, joint pain, myalgias and neck pain.  Skin: Negative.   Neurological: Negative.   Endo/Heme/Allergies: Bruises/bleeds easily (She has been on blood thinners started in Aug, she isn't sure when).       Bleeding issues transfused before 8/13 procedure  Psychiatric/Behavioral: Positive for depression (with ongoing pain and bleeding issues). The patient is nervous/anxious.    Blood pressure (!) 94/50, pulse (!) 59, temperature 98.7 F (37.1 C), temperature source Oral, resp. rate 18, height 5' 1.5" (1.562 m), weight 78.7 kg, SpO2 97 %. Physical Exam  Constitutional: She is oriented to person, place, and time. She appears well-developed and well-nourished. No distress.  HENT:  Head: Normocephalic and atraumatic.  Mouth/Throat: Oropharynx is clear and moist.  Eyes: Right eye exhibits no discharge. Left eye exhibits no discharge. No scleral icterus.  Pupils are equal  Neck: Normal range of motion. Neck supple. No JVD present. No thyromegaly present.  Cardiovascular: Normal rate, regular rhythm, normal heart sounds and intact distal pulses.  No murmur heard. Respiratory: Effort normal and breath sounds normal. No respiratory distress. She has no wheezes. She has no rales. She exhibits no tenderness.  GI: Soft. Bowel sounds are normal. She exhibits distension (minimal distension). She exhibits no mass. There is tenderness (She is tender all over, but abdomen is soft, I don't think she has rebound but remains very tender all over.).  There is no rebound and no guarding.  Still having loose stools Hx of chronic diarrhea - weekly BM's are the normal for her.  Musculoskeletal: She exhibits no edema or tenderness.  Lymphadenopathy:    She has no cervical adenopathy.  Neurological: She is alert and oriented to person, place, and time. No cranial nerve deficit.  Skin: Skin is warm and dry. No rash noted. She is not diaphoretic. No erythema. No pallor.  Psychiatric: She has a normal mood and affect. Her behavior is normal. Judgment and thought content normal.    Assessment/Plan: Abnormal uterine bleeding  S/p Hysteroscopy, Hydrothermal Endometrial Ablation(after failed Novasure ablation attempt)by Dr. Verita Schneiders on 07/14/18. New 8.4 x 4.6 cm fluid collection left adnexal region  Chronic constipation - pre op Anemia - transfused 9/11-19, & 07/19/18  DVT on chronic anticoagulation since 06/2018  Right pleural effusion/atelectasis  Plan:  CT was reviewed by Dr. Barry Dienes and with Dr. Sabino Dick in radiology.  It is their opinion this is most likely related to her adnexal structures on the left.  It is their recommendation you consider IR evaluation.  The colon inflammation appears secondary, she has no family history or personal history of diverticulitis.  Agree with antibiotics, you could also add a probiotic and consider a bowel regime after she is better for her chronic constipation.  Kimberly Stuart 07/24/2018, 3:43 PM

## 2018-07-24 NOTE — Progress Notes (Addendum)
3 Days Post-Op Procedure(s) (LRB): LAPAROSCOPY DIAGNOSTIC (N/A)  Subjective: Patient reports + flatus and + BM. She had 2 episodes of loose watery stool last night , one just before dulcolax and one immediately after, probably unrelated to tx. None since. Pt rested much better thru the night.   Objective: Will collect next stool for Enteric pathogens. I have reviewed patient's vital signs, medications and labs. BP 96/63 (BP Location: Right Arm)   Pulse (!) 59   Temp 99.3 F (37.4 C) (Oral)   Resp 15   Ht 5' 1.5" (1.562 m)   Wt 78.7 kg   LMP  (LMP Unknown)   SpO2 98%   BMI 32.25 kg/m   General: alert and no distress Resp: clear to auscultation bilaterally GI: normal findings: soft, non-tender, abnormal findings:  hyperactive bowel sounds and incision: clean, dry, intact and bowel sounds less hyperactive than yesterday Vaginal Bleeding: minimal CBC Latest Ref Rng & Units 07/24/2018 07/22/2018 07/20/2018  WBC 4.0 - 10.5 K/uL 11.0(H) 12.8(H) -  Hemoglobin 12.0 - 15.0 g/dL 10.2(L) 10.5(L) 9.1(L)  Hematocrit 36.0 - 46.0 % 31.2(L) 33.1(L) 27.7(L)  Platelets 150 - 400 K/uL 456(H) 444(H) -    Assessment:Post-operative Diagnosis: abdominal and pelvic bowel adhesions and possible left pelvic abscess, suspected diverticulitis  s/p Procedure(s): LAPAROSCOPY DIAGNOSTIC (N/A): stable, tolerating diet and improving abdominal pain s/p loose stool x 2,   Plan: 1. Continue IV antibiotics today 2  Stool for enteric pathogens did not place on contact precautions as loose bm has not continued 3 recheck later today  4 ESR pending, stool culture for EP pending   LOS: 4 days    Jonnie Kind 07/24/2018, 7:22 AM

## 2018-07-24 NOTE — Care Management (Signed)
CM saw patient in room today and spoke to her regarding discharge plans.  Patient's PCP is Dr. Cyndia BentBadger.  CM currently does not have insurance but has spoken to someone about applying for Medicaid she says.  [facility] [place] [facility] [facility]  Patient does not have any prescription coverage. [facility] may [facility] or if MD can give antibiotics off the $4 list for discharge. CM will continue to follow.

## 2018-07-24 NOTE — Progress Notes (Signed)
Gynecology Progress Note  Admission Date: 07/18/2018 Current Date: 07/24/2018 9:58 AM   Primary Care Provider: [place] (Dr. Anastasia Pall)  Kimberly Stuart is a 47 y.o. [id number] HD#7/POD#3 diagnostic laparoscopy, LOA/POD#10 attempted novasure endometrial ablation->HTA ablation admitted for abdominal pain   History complicated by: Patient Active Problem List   Diagnosis Date Noted  . Diverticulitis 07/23/2018  . Left lower quadrant abdominopelvic abscess ([place]) 07/23/2018  . Abdominal pain 07/18/2018  . Postoperative pain 07/18/2018  . Pelvic pain   . Symptomatic anemia 07/10/2018  . History of cervical dysplasia 07/10/2018  . Abnormal uterine bleeding (AUB) 06/13/2018  . Acute deep vein thrombosis (DVT) of left lower extremity ([place]) 06/13/2018  . Obesity (BMI 30.0-34.9) 06/13/2018    ROS and patient/family/surgical history, located on admission H&P note dated 07/18/2018, have been reviewed, and there are no changes except as noted below Yesterday/Overnight Events:  none  Subjective:  Patient having about 2-3 non saturating pads of VB/discharge (per patient looks purplish, like yogurt with berry color)  Pain continues from the mid to low back (feels like hammering) and from mid abdomen down and feels like something is hitting her. Pain is pretty much constant but can worsen in severity  Mild occasional nausea but no vomiting and can take some PO  Last BM yesterday. She states that she had a watery BM that looked black yesterday morning and then she got a suppository and that caused her to have another loose BM that was yellow/green  Objective:    Current Vital Signs 24h Vital Sign Ranges  T 98.9 F (37.2 C) Temp  Avg: 98.6 F (37 C)  Min: 98.1 F (36.7 C)  Max: 99.3 F (37.4 C)  BP 108/63 BP  Min: 96/63  Max: 114/76  HR (!) 53 Pulse  Avg: 60.8  Min: 53  Max: 69  RR 18 Resp  Avg: 17.8  Min: 15  Max: 20  SaO2 97 % Room Air SpO2  Avg: 98.5 %  Min: 97 %  Max: 100 %      24 Hour I/O Current Shift I/O  Time Ins Outs No intake/output data recorded. No intake/output data recorded.   Patient Vitals for the past 24 hrs:  BP Temp Temp src Pulse Resp SpO2  07/24/18 0813 108/63 98.9 F (37.2 C) Oral (!) 53 18 97 %  07/24/18 0325 96/63 99.3 F (37.4 C) Oral (!) 59 15 -  07/23/18 2300 100/60 98.1 F (36.7 C) - - 16 98 %  07/23/18 1932 103/64 98.8 F (37.1 C) - 69 20 100 %  07/23/18 1616 114/76 98.1 F (36.7 C) Oral 62 20 99 %   Last Fever: 38.2 on 9/20  Physical exam: General appearance: alert, cooperative and appears stated age Abdomen: +BS, mildly distended, c/d/i l/s port sites with dermabond over them x 3. No peritoneal s/s. Pt states diffusely ttp on exam Back: mild b/l ttp GU: No gross VB Extremities: no c/c/e Psych: appropriate Neurologic: Grossly normal  Medications Current Facility-Administered Medications  Medication Dose Route Frequency Provider Last Rate Last Dose  . 0.9 %  sodium chloride infusion  250 mL Intravenous PRN Anyanwu, Ugonna A, MD      . apixaban (ELIQUIS) tablet 5 mg  5 mg Oral BID Woodroe Mode, MD   5 mg at 07/24/18 0824  . cyclobenzaprine (FLEXERIL) tablet 10 mg  10 mg Oral TID PRN Anyanwu, Sallyanne Havers, MD   10 mg at 07/23/18 1650  . HYDROmorphone (DILAUDID) injection  1-2 mg  1-2 mg Intravenous Q3H PRN Jonnie Kind, MD   1 mg at 07/24/18 9476  . lactated ringers infusion   Intravenous Continuous Anyanwu, Sallyanne Havers, MD 10 mL/hr at 07/24/18 0315    . megestrol (MEGACE) tablet 80 mg  80 mg Oral BID Truett Mainland, DO   80 mg at 07/24/18 5465  . ondansetron (ZOFRAN) tablet 4 mg  4 mg Oral Q6H PRN Woodroe Mode, MD       Or  . ondansetron [facility]) injection 4 mg  4 mg Intravenous Q6H PRN Woodroe Mode, MD   4 mg at 07/24/18 0315  . oxyCODONE-acetaminophen (PERCOCET/ROXICET) 5-325 MG per tablet 1-2 tablet  1-2 tablet Oral Q3H PRN Woodroe Mode, MD   2 tablet at 07/23/18 1646  . piperacillin-tazobactam (ZOSYN) IVPB  3.375 g  3.375 g Intravenous Q8H Woodroe Mode, MD 12.5 mL/hr at 07/24/18 0313 3.375 g at 07/24/18 0313  . polyethylene glycol (MIRALAX / GLYCOLAX) packet 17 g  17 g Oral Daily Lavonia Drafts, MD   17 g at 07/23/18 0951  . sodium chloride flush (NS) 0.9 % injection 3 mL  3 mL Intravenous Q12H Anyanwu, Ugonna A, MD   3 mL at 07/20/18 2204  . sodium chloride flush (NS) 0.9 % injection 3 mL  3 mL Intravenous PRN Anyanwu, Ugonna A, MD      . zolpidem (AMBIEN) tablet 5 mg  5 mg Oral QHS PRN Woodroe Mode, MD   5 mg at 07/23/18 2153      Labs  Recent Labs  Lab 07/19/18 0546 07/20/18 0244 07/22/18 0633 07/24/18 0523  WBC 8.1  --  12.8* 11.0*  HGB 7.6* 9.1* 10.5* 10.2*  HCT 23.8* 27.7* 33.1* 31.2*  PLT 323  --  444* 456*    Recent Labs  Lab 07/18/18 0619 07/19/18 0546 07/22/18 0633  NA 135 133* 137  K 3.2* 3.6 4.9  CL 103 104 104  CO2 19* 21* 23  BUN 12 6 11   CREATININE 1.05* 0.88 0.89  CALCIUM 8.4* 8.3* 8.7*  PROT 6.7  --   --   BILITOT 0.7  --   --   ALKPHOS 93  --   --   ALT 22  --   --   AST 16  --   --   GLUCOSE 124* 96 150*   ESR: 72 Radiology No new imaging  Assessment & Plan:  Pt stable *GYN: given operative note findings and not subjectively improving, will call Gen Surg for any recommendations. Continue bid megace.  *ID: continue zosyn D#4. ESR ordered for this AM elevated at 72 but uncertain of significance given her clinical situation. No swabs or cultures were sent at the time of surgery. No other outstanding cultures pending. Pt okay with going on precautions and will send c. Diff. Pt has hat for toilet and to let RN know when she has a BM *Heme: continue eliquuis *Pain: continue IV and PO pain meds *FEN/GI: continue regular diet. SLIV *PPx: OOB ad lib, pt on eliquis *Dispo: pending improvement in s/s.   Code Status: Full Code  Total time taking care of the patient was 25 minutes, with greater than 50% of the time spent in face to face  interaction with the patient.  Durene Romans MD Attending Center for [facility])

## 2018-07-25 ENCOUNTER — Ambulatory Visit: Payer: Medicaid Other | Admitting: Obstetrics & Gynecology

## 2018-07-25 LAB — CBC WITH DIFFERENTIAL/PLATELET
Basophils Absolute: 0 10*3/uL (ref 0.0–0.1)
Basophils Relative: 0 %
EOS ABS: 0.2 10*3/uL (ref 0.0–0.7)
EOS PCT: 2 %
HCT: 34.6 % — ABNORMAL LOW (ref 36.0–46.0)
HEMOGLOBIN: 11 g/dL — AB (ref 12.0–15.0)
LYMPHS ABS: 1.5 10*3/uL (ref 0.7–4.0)
Lymphocytes Relative: 16 %
MCH: 25.6 pg — AB (ref 26.0–34.0)
MCHC: 31.8 g/dL (ref 30.0–36.0)
MCV: 80.7 fL (ref 78.0–100.0)
MONO ABS: 0.4 10*3/uL (ref 0.1–1.0)
MONOS PCT: 5 %
Neutro Abs: 7.5 10*3/uL (ref 1.7–7.7)
Neutrophils Relative %: 77 %
PLATELETS: 480 10*3/uL — AB (ref 150–400)
RBC: 4.29 MIL/uL (ref 3.87–5.11)
RDW: 19.6 % — AB (ref 11.5–15.5)
WBC: 9.6 10*3/uL (ref 4.0–10.5)

## 2018-07-25 LAB — BASIC METABOLIC PANEL
Anion gap: 12 (ref 5–15)
BUN: 11 mg/dL (ref 6–20)
CALCIUM: 8.2 mg/dL — AB (ref 8.9–10.3)
CHLORIDE: 101 mmol/L (ref 98–111)
CO2: 21 mmol/L — ABNORMAL LOW (ref 22–32)
CREATININE: 1.03 mg/dL — AB (ref 0.44–1.00)
GFR calc Af Amer: >60 mL/min (ref >60)
GFR calc non Af Amer: >60 mL/min (ref >60)
Glucose, Bld: 98 mg/dL (ref 70–99)
Potassium: 3.8 mmol/L (ref 3.5–5.1)
SODIUM: 134 mmol/L — AB (ref 135–145)

## 2018-07-25 LAB — TYPE AND SCREEN
ABO/RH(D): A POS
Antibody Screen: NEGATIVE

## 2018-07-25 MED ORDER — ENOXAPARIN SODIUM 80 MG/0.8ML ~~LOC~~ SOLN
1.0000 mg/kg | Freq: Two times a day (BID) | SUBCUTANEOUS | Status: DC
  Filled 2018-07-25: qty 0.8

## 2018-07-25 MED ORDER — ENOXAPARIN SODIUM 80 MG/0.8ML ~~LOC~~ SOLN
1.0000 mg/kg | Freq: Two times a day (BID) | SUBCUTANEOUS | Status: AC
  Administered 2018-07-25 (×2): 80 mg via SUBCUTANEOUS
  Filled 2018-07-25 (×2): qty 0.8

## 2018-07-25 NOTE — Progress Notes (Signed)
Gynecology Progress Note  Admission Date: 07/18/2018 Current Date: 07/25/2018 9:22 AM   Primary Care Provider: [facility] (Dr. Antony HasteMichael Badger)  Kimberly Stuart is a 47 y.o. [id number] HD#8/POD#4 diagnostic laparoscopy, LOA/POD#11 attempted novasure endometrial ablation->HTA ablation admitted for abdominal pain   History complicated by: Patient Active Problem List   Diagnosis Date Noted  . Diverticulitis 07/23/2018  . Left lower quadrant abdominopelvic abscess (HCC) 07/23/2018  . Abdominal pain 07/18/2018  . Postoperative pain 07/18/2018  . Pelvic pain   . Symptomatic anemia 07/10/2018  . History of cervical dysplasia 07/10/2018  . Abnormal uterine bleeding (AUB) 06/13/2018  . Acute deep vein thrombosis (DVT) of left lower extremity (HCC) 06/13/2018  . Obesity (BMI 30.0-34.9) 06/13/2018    ROS and patient/family/surgical history, located on admission H&P note dated 07/18/2018, have been reviewed, and there are no changes except as noted below Yesterday/Overnight Events:  Had CT scan and 8 x 4.5cm fluid collection seen in the left adnexa/pelvic region. Pt seen by [facility] and recommend continuing current treatment and to d/w IR regarding possible drain of fluid collection  Subjective:  Minimal VB/discharge  Pain continues but feels that it is somewhat better  Patient was able to take some PO last night and has been NPO after MN.  Last BM yesterday that looks yellow/green  Objective:    Current Vital Signs 24h Vital Sign Ranges  T 97.8 F (36.6 C) Temp  Avg: 98.4 F (36.9 C)  Min: 97.8 F (36.6 C)  Max: 98.7 F (37.1 C)  BP (!) 101/57 BP  Min: 91/58  Max: 106/64  HR (!) 55 Pulse  Avg: 58.3  Min: 51  Max: 67  RR 18 Resp  Avg: 17.3  Min: 16  Max: 18  SaO2 98 % Room Air SpO2  Avg: 97.5 %  Min: 94 %  Max: 100 %       24 Hour I/O Current Shift I/O  Time Ins Outs No intake/output data recorded. No intake/output data recorded.   Patient Vitals for the past 24 hrs:  BP Temp  Temp src Pulse Resp SpO2  07/25/18 0741 (!) 101/57 97.8 F (36.6 C) Oral (!) 55 18 98 %  07/25/18 0449 102/61 98.6 F (37 C) - (!) 55 18 98 %  07/24/18 2338 (!) 91/58 98.4 F (36.9 C) - (!) 51 16 98 %  07/24/18 1942 (!) 105/54 98.6 F (37 C) - 67 16 94 %  07/24/18 1546 106/64 98.1 F (36.7 C) Oral 63 18 100 %  07/24/18 1148 (!) 94/50 98.7 F (37.1 C) Oral (!) 59 18 97 %   Last Fever: 38.2 on 9/20  Physical exam: General appearance: alert, cooperative and appears stated age Abdomen: +BS, mildly distended. nttp GU: No gross VB Extremities: no c/c/e Psych: appropriate Neurologic: Grossly normal  Medications Current Facility-Administered Medications  Medication Dose Route Frequency Provider Last Rate Last Dose  . 0.9 %  sodium chloride infusion  250 mL Intravenous PRN Anyanwu, Ugonna A, MD      . 0.9 %  sodium chloride infusion   Intravenous Continuous [place] BingPickens, Kent Riendeau, MD 75 mL/hr at 07/24/18 1409    . apixaban (ELIQUIS) tablet 5 mg  5 mg Oral BID Adam PhenixArnold, James G, MD   5 mg at 07/24/18 2216  . cyclobenzaprine (FLEXERIL) tablet 10 mg  10 mg Oral TID PRN Anyanwu, Jethro BastosUgonna A, MD   10 mg at 07/23/18 1650  . HYDROmorphone (DILAUDID) injection 1-2 mg  1-2 mg Intravenous Q3H PRN  Tilda Burrow, MD   1 mg at 07/24/18 1412  . lactated ringers infusion   Intravenous Continuous Anyanwu, Jethro Bastos, MD 10 mL/hr at 07/24/18 0315    . megestrol (MEGACE) tablet 80 mg  80 mg Oral BID Levie Heritage, DO   80 mg at 07/24/18 2215  . ondansetron (ZOFRAN) tablet 4 mg  4 mg Oral Q6H PRN Adam Phenix, MD       Or  . ondansetron [facility]) injection 4 mg  4 mg Intravenous Q6H PRN Adam Phenix, MD   4 mg at 07/24/18 0315  . oxyCODONE-acetaminophen (PERCOCET/ROXICET) 5-325 MG per tablet 1-2 tablet  1-2 tablet Oral Q3H PRN Adam Phenix, MD   2 tablet at 07/25/18 0343  . piperacillin-tazobactam (ZOSYN) IVPB 3.375 g  3.375 g Intravenous Q8H Adam Phenix, MD 12.5 mL/hr at 07/25/18 0345 3.375 g at  07/25/18 0345  . polyethylene glycol (MIRALAX / GLYCOLAX) packet 17 g  17 g Oral Daily PRN [place] Bing, MD      . sodium chloride flush (NS) 0.9 % injection 3 mL  3 mL Intravenous Q12H Anyanwu, Ugonna A, MD   3 mL at 07/20/18 2204  . sodium chloride flush (NS) 0.9 % injection 3 mL  3 mL Intravenous PRN Anyanwu, Ugonna A, MD      . zolpidem (AMBIEN) tablet 5 mg  5 mg Oral QHS PRN Adam Phenix, MD   5 mg at 07/23/18 2153      Labs  Recent Labs  Lab 07/22/18 0633 07/24/18 0523 07/25/18 0659  WBC 12.8* 11.0* 9.6  HGB 10.5* 10.2* 11.0*  HCT 33.1* 31.2* 34.6*  PLT 444* 456* 480*    Recent Labs  Lab 07/22/18 0633 07/24/18 0523 07/25/18 0659  NA 137 137 134*  K 4.9 4.2 3.8  CL 104 103 101  CO2 23 24 21*  BUN 11 9 11   CREATININE 0.89 0.92 1.03*  CALCIUM 8.7* 8.4* 8.2*  GLUCOSE 150* 89 98   Pending: T&S  Radiology CLINICAL DATA:  Lower abdominal and bilateral flank pain.  EXAM: CT ABDOMEN AND PELVIS WITH CONTRAST  TECHNIQUE: Multidetector CT imaging of the abdomen and pelvis was performed using the standard protocol following bolus administration of intravenous contrast.  CONTRAST:  ISOVUE-300 IOPAMIDOL (ISOVUE-300) INJECTION 61%  COMPARISON:  CT scan of July 18, 2018.  FINDINGS: Lower chest: Minimal right pleural effusion is noted with adjacent subsegmental atelectasis.  Hepatobiliary: No gallstones are noted. No biliary dilatation is noted. 19 mm hemangioma seen posteriorly in right hepatic lobe.  Pancreas: Unremarkable. No pancreatic ductal dilatation or surrounding inflammatory changes.  Spleen: Mild splenomegaly is noted.  Adrenals/Urinary Tract: Adrenal glands are unremarkable. Kidneys are normal, without renal calculi, focal lesion, or hydronephrosis. Bladder is unremarkable.  Stomach/Bowel: The stomach appears normal. The appendix is unremarkable. There is no evidence of bowel obstruction. Wall thickening and  inflammatory changes are seen involving a portion of the sigmoid colon, most likely representing secondary inflammation from left adnexal inflammatory process.  Vascular/Lymphatic: No significant vascular abnormality is noted. Left periaortic adenopathy is noted which most likely is inflammatory in etiology.  Reproductive: Large amount of gas and fluid is noted in the endometrial space which may represent postoperative findings, although endometritis cannot be excluded. 8.4 x 4.6 cm irregular fluid collection is seen in the left adnexal region most consistent with pelvic abscess. As noted above, it appears to extend superiorly to the sigmoid colon, resulting in secondary inflammation.  Other: No hernia is noted.  Musculoskeletal: No acute or significant osseous findings.  IMPRESSION: 8.4 x 4.6 cm irregular fluid collection seen in left adnexal region most consistent with pelvic abscess. It extends superiorly and appears to be resulting in secondary inflammation of adjacent sigmoid colon. Fluid and gas are noted in the endometrial space which may represent postoperative changes, although infection cannot be excluded. These results were called by telephone at the time of interpretation on 07/24/2018 at 5:04 pm to Dr. Donell Beers, who verbally acknowledged these results.  Mild splenomegaly.  Minimal right pleural effusion with adjacent subsegmental atelectasis.   Electronically Signed   By: Lupita Raider, M.D.   On: 07/24/2018 17:05   Assessment & Plan:  Pt stable *GYN: d/w IR and might be possible to drain fluid collection. I d/w the patient and two options are to continue current management and hold off on IR drainage attempt given numbers are continuing to improve and her s/s are slightly better today or to try and have them drain it. Pt states she wants them to try. We are unable to do today b/c will have to hold the bid eliquis for 48 hours; IR states tx dose lovenox  only has to be held for 24 hours. Will do bid lovenox today (I d/w pharmacy already) and then hold tomorrow with plan for IR drainage attempt on Thursday. Coags ordered for Thursday. Continue bid megace.  *ID: continue zosyn D#5. WBC trending down -9/23 c diff negative *Heme: d/c eliquis for lovenox bid today and hold anti-coagulation tomorrow *AKI: repeat BMP tomorrow. IVF x 24 hours *Pain: continue IV and PO pain meds *FEN/GI: continue regular diet. MIVF x 24 *PPx: OOB ad lib, pt on eliquis *Dispo: pending continued improvement in s/s.   Code Status: Full Code  Total time taking care of the patient was 25 minutes, with greater than 50% of the time spent in face to face interaction with the patient.  Cornelia Copa MD Attending Center for [facility])

## 2018-07-25 NOTE — Progress Notes (Signed)
Patient ID: Kimberly HedgeMildred A Stuart, female   DOB: 06/15/1971, 47 y.o.   MRN: [id number]   Pt is scheduled for pelvic abscess drain placement [facility] 9/26 Cont to HOLD Eliquis DC Lovenox 9/25 pm [facility] am 9/26 [facility]  RN aware of plans and orders

## 2018-07-25 NOTE — Progress Notes (Signed)
ANTICOAGULATION CONSULT NOTE - Initial Consult  Pharmacy Consult for lovenox Indication: DVT - transition to lovenox for IR  Allergies  Allergen Reactions  . Estrogens Other (See Comments)    DVT    Patient Measurements: Height: 5' 1.5" (156.2 cm) Weight: 173 lb 8 oz (78.7 kg) IBW/kg (Calculated) : 48.95  Vital Signs: Temp: 97.8 F (36.6 C) (09/24 0741) Temp Source: Oral (09/24 0741) BP: 101/57 (09/24 0741) Pulse Rate: 55 (09/24 0741)  Labs: Recent Labs    07/24/18 0523 07/25/18 0659  HGB 10.2* 11.0*  HCT 31.2* 34.6*  PLT 456* 480*  CREATININE 0.92 1.03*    Estimated Creatinine Clearance: 65.6 mL/min (A) (by C-G formula based on SCr of 1.03 mg/dL (H)).   Medical History: Past Medical History:  Diagnosis Date  . Anemia   . Vitamin D deficiency   . VTE (venous thromboembolism) 2019   left calf. after OCP used for AUB    Medications:  Scheduled:  . enoxaparin (LOVENOX) injection  1 mg/kg Subcutaneous Q12H  . megestrol  80 mg Oral BID  . sodium chloride flush  3 mL Intravenous Q12H    Assessment: 47 yo F on apixaban PTA for DVT in August.  Plans for IR evaluation of adnexal structures on Thursday after holding apixaban x 48h.  To bridge with lovenox for 2 doses today, hold 10/25 then IR Thursday 9/25.    Goal of Therapy:  Anti-Xa level 0.6-1 units/ml 4hrs after LMWH dose given Monitor platelets by anticoagulation protocol: Yes   Plan:  Lovenox 1 mg/kg  = 80 mg sq q12h x 2 doses only F/u plans to resume anticoag after IR Thursday  Drusilla KannerGrimsley, Somaya Grassi Lydia 07/25/2018,10:15 AM

## 2018-07-26 ENCOUNTER — Ambulatory Visit: Payer: Medicaid Other | Admitting: Obstetrics & Gynecology

## 2018-07-26 LAB — BASIC METABOLIC PANEL
Anion gap: 10 (ref 5–15)
BUN: 8 mg/dL (ref 6–20)
CALCIUM: 8.7 mg/dL — AB (ref 8.9–10.3)
CO2: 21 mmol/L — ABNORMAL LOW (ref 22–32)
CREATININE: 0.85 mg/dL (ref 0.44–1.00)
Chloride: 105 mmol/L (ref 98–111)
GFR calc Af Amer: >60 mL/min (ref >60)
GLUCOSE: 96 mg/dL (ref 70–99)
Potassium: 4.2 mmol/L (ref 3.5–5.1)
SODIUM: 136 mmol/L (ref 135–145)

## 2018-07-26 LAB — CBC WITH DIFFERENTIAL/PLATELET
Basophils Absolute: 0.1 10*3/uL (ref 0.0–0.1)
Basophils Relative: 1 %
EOS ABS: 0.2 10*3/uL (ref 0.0–0.7)
Eosinophils Relative: 2 %
HCT: 35.4 % — ABNORMAL LOW (ref 36.0–46.0)
Hemoglobin: 11.4 g/dL — ABNORMAL LOW (ref 12.0–15.0)
LYMPHS ABS: 1.7 10*3/uL (ref 0.7–4.0)
LYMPHS PCT: 18 %
MCH: 25.9 pg — AB (ref 26.0–34.0)
MCHC: 32.2 g/dL (ref 30.0–36.0)
MCV: 80.3 fL (ref 78.0–100.0)
MONO ABS: 0.3 10*3/uL (ref 0.1–1.0)
Monocytes Relative: 3 %
Neutro Abs: 7.3 10*3/uL (ref 1.7–7.7)
Neutrophils Relative %: 76 %
PLATELETS: 469 10*3/uL — AB (ref 150–400)
RBC: 4.41 MIL/uL (ref 3.87–5.11)
RDW: 19.4 % — AB (ref 11.5–15.5)
WBC: 9.6 10*3/uL (ref 4.0–10.5)

## 2018-07-26 MED ORDER — DEXTROSE IN LACTATED RINGERS 5 % IV SOLN
INTRAVENOUS | Status: DC
  Administered 2018-07-26: 20:00:00 via INTRAVENOUS

## 2018-07-26 NOTE — Progress Notes (Signed)
Gynecology Progress Note  Admission Date: 07/18/2018 Current Date: 07/26/2018 10:29 AM   Primary Care Provider: [facility] (Dr. Antony Haste)  Kimberly Stuart is a 47 y.o. [id number] HD#9/POD#5 diagnostic laparoscopy, LOA/POD#12 attempted novasure endometrial ablation->HTA ablation admitted for abdominal pain   History complicated by: Patient Active Problem List   Diagnosis Date Noted  . Diverticulitis 07/23/2018  . Left lower quadrant abdominopelvic abscess (HCC) 07/23/2018  . Abdominal pain 07/18/2018  . Postoperative pain 07/18/2018  . Pelvic pain   . Symptomatic anemia 07/10/2018  . History of cervical dysplasia 07/10/2018  . Abnormal uterine bleeding (AUB) 06/13/2018  . Acute deep vein thrombosis (DVT) of left lower extremity (HCC) 06/13/2018  . Obesity (BMI 30.0-34.9) 06/13/2018    ROS and patient/family/surgical history, located on admission H&P note dated 07/18/2018, have been reviewed, and there are no changes except as noted below Yesterday/Overnight Events:  None  Subjective:  Still having some VB/discharge  Pain feels the same to worse, and worsened with ambulation  Last BM yesterday and still somewhat loose and yellow/green Objective:    Current Vital Signs 24h Vital Sign Ranges  T 99.2 F (37.3 C) Temp  Avg: 98.6 F (37 C)  Min: 98.1 F (36.7 C)  Max: 99.2 F (37.3 C)  BP 108/65 BP  Min: 94/62  Max: 124/70  HR 67 Pulse  Avg: 67.8  Min: 55  Max: 86  RR 18 Resp  Avg: 16.8  Min: 16  Max: 18  SaO2 98 % Room Air SpO2  Avg: 98.2 %  Min: 97 %  Max: 99 %       24 Hour I/O Current Shift I/O  Time Ins Outs No intake/output data recorded. No intake/output data recorded.   Patient Vitals for the past 24 hrs:  BP Temp Temp src Pulse Resp SpO2  07/26/18 0748 108/65 99.2 F (37.3 C) Oral 67 18 98 %  07/26/18 0415 124/70 98.1 F (36.7 C) Oral (!) 55 16 99 %  07/25/18 2337 120/63 98.5 F (36.9 C) Oral 65 16 99 %  07/25/18 1938 121/68 98.2 F (36.8 C) Oral  72 17 98 %  07/25/18 1628 94/62 98.8 F (37.1 C) Oral 62 16 98 %  07/25/18 1218 (!) 98/52 98.8 F (37.1 C) Oral 86 18 97 %   Last Fever: 38.2 on 9/20  Physical exam: General appearance: alert, cooperative and appears stated age Abdomen: +BS, mildly distended. nttp GU: No gross VB. Old pad examined and some dark red/purple blood and yellow d/c on pad, approx  5x5cm area, non saturating Extremities: no c/c/e Psych: appropriate Neurologic: Grossly normal  Medications Current Facility-Administered Medications  Medication Dose Route Frequency Provider Last Rate Last Dose  . 0.9 %  sodium chloride infusion  250 mL Intravenous PRN Anyanwu, Ugonna A, MD      . cyclobenzaprine (FLEXERIL) tablet 10 mg  10 mg Oral TID PRN Anyanwu, Jethro Bastos, MD   10 mg at 07/25/18 2322  . dextrose 5 % in lactated ringers infusion   Intravenous Continuous [place] Bing, MD      . HYDROmorphone (DILAUDID) injection 1-2 mg  1-2 mg Intravenous Q3H PRN Tilda Burrow, MD   1 mg at 07/26/18 0754  . lactated ringers infusion   Intravenous Continuous Anyanwu, Jethro Bastos, MD 10 mL/hr at 07/24/18 0315    . megestrol (MEGACE) tablet 80 mg  80 mg Oral BID Levie Heritage, DO   80 mg at 07/26/18 0947  . ondansetron (ZOFRAN)  tablet 4 mg  4 mg Oral Q6H PRN Adam Phenix, MD       Or  . ondansetron [facility]) injection 4 mg  4 mg Intravenous Q6H PRN Adam Phenix, MD   4 mg at 07/24/18 0315  . oxyCODONE-acetaminophen (PERCOCET/ROXICET) 5-325 MG per tablet 1-2 tablet  1-2 tablet Oral Q3H PRN Adam Phenix, MD   2 tablet at 07/25/18 2136  . piperacillin-tazobactam (ZOSYN) IVPB 3.375 g  3.375 g Intravenous Q8H Adam Phenix, MD 12.5 mL/hr at 07/26/18 0424 3.375 g at 07/26/18 0424  . polyethylene glycol (MIRALAX / GLYCOLAX) packet 17 g  17 g Oral Daily PRN  Bing, MD      . sodium chloride flush (NS) 0.9 % injection 3 mL  3 mL Intravenous Q12H Anyanwu, Ugonna A, MD   3 mL at 07/20/18 2204  . sodium chloride flush  (NS) 0.9 % injection 3 mL  3 mL Intravenous PRN Anyanwu, Ugonna A, MD      . zolpidem (AMBIEN) tablet 5 mg  5 mg Oral QHS PRN Adam Phenix, MD   5 mg at 07/23/18 2153      Labs  Recent Labs  Lab 07/24/18 0523 07/25/18 0659 07/26/18 0650  WBC 11.0* 9.6 9.6  HGB 10.2* 11.0* 11.4*  HCT 31.2* 34.6* 35.4*  PLT 456* 480* 469*    Recent Labs  Lab 07/24/18 0523 07/25/18 0659 07/26/18 0650  NA 137 134* 136  K 4.2 3.8 4.2  CL 103 101 105  CO2 24 21* 21*  BUN 9 11 8   CREATININE 0.92 1.03* 0.85  CALCIUM 8.4* 8.2* 8.7*  GLUCOSE 89 98 96   A POS   Radiology No new imaging   Assessment & Plan:  Pt stable *GYN: for IR attempt to drain pelvic abscess tomorrow.  Coags ordered for tomorrow and pt to be NPO after MN. Continue bid megace. D/w pt expectations that may not be able to drain tomorrow and if so it doesn't mean that pain will be completely resolved.  *ID: continue zosyn D#6. WBC stable. See above. D/w her that whether or not able to drain tomorrow, I recommend switching to PO abx -9/23 c diff negative *Heme h/o VTE: d/c eliquis for lovenox bid yesterday and holding anti-coagulation today for procedure tomorrow. continue SCDs to LLE. Restart anti-coagulation based on if procedure is able to be done tomorrow *AKI: resolved *Pain: continue IV and PO pain meds *FEN/GI: continue regular diet. SLIV *PPx: OOB ad lib, SCD *Dispo: pending continued improvement in s/s.   Code Status: Full Code  Total time taking care of the patient was 25 minutes, with greater than 50% of the time spent in face to face interaction with the patient.  Cornelia Copa MD Attending Center for [facility])

## 2018-07-26 NOTE — Progress Notes (Signed)
Pt was sleeping when I returned today to see her.  I spent time with her mother, Fulton Mole, who shared about other family stressors in their life and her worries about her daughter's condition.  I had brought Millie a prayer shawl because she spoke yesterday about the power of prayer.  I offered listening presence to her mother who seemed somewhat more at ease today.  She stated that they still are concerned about whether the procedure tomorrow is what is needed.  Alice spoke about Millie's son's concerns and fears that his mother will not be okay.  I let them know that a chaplain can be paged when he visits if he would find it helpful to talk to someone.  Chaplain Dyanne Carrel, Bcc Pager, [phone] 4:20 PM    07/26/18 1600  Clinical Encounter Type  Visited With Family  Visit Type Follow-up;Spiritual support  Referral From Nurse

## 2018-07-26 NOTE — Progress Notes (Signed)
This is a late entry note from a conversation that took place on Tuesday, 9/24 at 3:30 in the afternoon.  I was referred to pt by her nurse because of the time length of her complications.  She was frustrated by the change in her medical plans and feels that it is difficult to trust the plan of care when it changes and when her provider changes so frequently.  She has not seen the provider who did her initial surgery since that date and is very concerned that the other providers have not had a clear picture of what is going on for her.  She has also been frustrated because she has been told to go to both [facility] and the staff at the emergency department and [facility] have been confused by why she chose that particular hospital.  She made comments about "what she might do after she returns home."  When I gently asked further about what that meant she said, how she might respond legally.  I alerted Wyvonna Plum, Chiropodist on the OBGYN High Risk unit as well as Patient Experience.  Other stressors in her life include being a mother to a high school boy as well as putting her middle child through college.  She also has an older son as well.  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  She has used all of her vacation/sick days throughout the last several weeks and is very concerned about how she will pay bills, particularly her medical bills.  I offered reflective listening and a ministry of presence to her and to her mother.    Chaplain Dyanne Carrel, Bcc Pager, [phone] 4:15 PM   07/26/18 1100  Clinical Encounter Type  Visited With Patient and family together  Visit Type Spiritual support

## 2018-07-27 ENCOUNTER — Ambulatory Visit (HOSPITAL_COMMUNITY)
Admit: 2018-07-27 | Discharge: 2018-07-27 | Disposition: A | Discharge status: Home / Self Care | Payer: Medicaid Other | Attending: Obstetrics and Gynecology | Admitting: Obstetrics and Gynecology

## 2018-07-27 LAB — CBC WITH DIFFERENTIAL/PLATELET
Basophils Absolute: 0 10*3/uL (ref 0.0–0.1)
Basophils Relative: 0 %
EOS ABS: 0.2 10*3/uL (ref 0.0–0.7)
EOS PCT: 2 %
HCT: 36.2 % (ref 36.0–46.0)
HEMOGLOBIN: 11.5 g/dL — AB (ref 12.0–15.0)
Lymphocytes Relative: 18 %
Lymphs Abs: 1.5 10*3/uL (ref 0.7–4.0)
MCH: 25.7 pg — AB (ref 26.0–34.0)
MCHC: 31.8 g/dL (ref 30.0–36.0)
MCV: 81 fL (ref 78.0–100.0)
Monocytes Absolute: 0.2 10*3/uL (ref 0.1–1.0)
Monocytes Relative: 3 %
NEUTROS PCT: 77 %
Neutro Abs: 6 10*3/uL (ref 1.7–7.7)
PLATELETS: 482 10*3/uL — AB (ref 150–400)
RBC: 4.47 MIL/uL (ref 3.87–5.11)
RDW: 19.3 % — ABNORMAL HIGH (ref 11.5–15.5)
WBC: 7.9 10*3/uL (ref 4.0–10.5)

## 2018-07-27 LAB — APTT: aPTT: 37 seconds — ABNORMAL HIGH (ref 24–36)

## 2018-07-27 LAB — PROTIME-INR
INR: 1.19
PROTHROMBIN TIME: 15 s (ref 11.4–15.2)

## 2018-07-27 MED ORDER — HYDROMORPHONE HCL 1 MG/ML IJ SOLN
INTRAMUSCULAR | Status: AC
  Filled 2018-07-27: qty 1

## 2018-07-27 MED ORDER — ACETAMINOPHEN 325 MG PO TABS: 650.0000 mg | ORAL_TABLET | Freq: Four times a day (QID) | ORAL | Status: DC | PRN

## 2018-07-27 MED ORDER — MIDAZOLAM HCL 2 MG/2ML IJ SOLN
INTRAMUSCULAR | Status: AC | PRN
  Administered 2018-07-27 (×3): 1 mg via INTRAVENOUS

## 2018-07-27 MED ORDER — FENTANYL CITRATE (PF) 100 MCG/2ML IJ SOLN
INTRAMUSCULAR | Status: AC | PRN
  Administered 2018-07-27 (×2): 50 ug via INTRAVENOUS
  Administered 2018-07-27 (×2): 25 ug via INTRAVENOUS
  Administered 2018-07-27: 50 ug via INTRAVENOUS

## 2018-07-27 MED ORDER — MIDAZOLAM HCL 2 MG/2ML IJ SOLN
INTRAMUSCULAR | Status: AC
  Filled 2018-07-27: qty 4

## 2018-07-27 MED ORDER — LIDOCAINE-EPINEPHRINE 1 %-1:100000 IJ SOLN
INTRAMUSCULAR | Status: AC
  Filled 2018-07-27: qty 1

## 2018-07-27 MED ORDER — OXYCODONE HCL 5 MG PO TABS
5.0000 mg | ORAL_TABLET | ORAL | Status: DC | PRN
  Administered 2018-07-27 – 2018-07-29 (×7): 10 mg via ORAL
  Filled 2018-07-27 (×8): qty 2

## 2018-07-27 MED ORDER — METRONIDAZOLE 500 MG PO TABS
500.0000 mg | ORAL_TABLET | Freq: Two times a day (BID) | ORAL | Status: DC
  Administered 2018-07-27 – 2018-07-29 (×5): 500 mg via ORAL
  Filled 2018-07-27 (×5): qty 1

## 2018-07-27 MED ORDER — APIXABAN 5 MG PO TABS
5.0000 mg | ORAL_TABLET | Freq: Two times a day (BID) | ORAL | Status: DC
  Administered 2018-07-28 – 2018-07-29 (×3): 5 mg via ORAL
  Filled 2018-07-27 (×3): qty 1

## 2018-07-27 MED ORDER — ONDANSETRON HCL 4 MG/2ML IJ SOLN
INTRAMUSCULAR | Status: AC
  Filled 2018-07-27: qty 2

## 2018-07-27 MED ORDER — HYDROMORPHONE HCL 1 MG/ML IJ SOLN
INTRAMUSCULAR | Status: AC | PRN
  Administered 2018-07-27: 1 mg via INTRAVENOUS

## 2018-07-27 MED ORDER — FENTANYL CITRATE (PF) 100 MCG/2ML IJ SOLN
INTRAMUSCULAR | Status: AC
  Filled 2018-07-27: qty 4

## 2018-07-27 MED ORDER — SULFAMETHOXAZOLE-TRIMETHOPRIM 800-160 MG PO TABS
1.0000 | ORAL_TABLET | Freq: Two times a day (BID) | ORAL | Status: DC
  Administered 2018-07-27 – 2018-07-29 (×5): 1 via ORAL
  Filled 2018-07-27 (×5): qty 1

## 2018-07-27 MED ORDER — SODIUM CHLORIDE 0.9% FLUSH: 5.0000 mL | Freq: Three times a day (TID) | INTRAVENOUS | Status: DC

## 2018-07-27 NOTE — Progress Notes (Signed)
Attempted to follow up with patient to offer continued support, but she was sleeping after her procedure.    Please page as further needs arise.  Maryanna Shape. Carley Hammed, M.Div. [facility] Chaplain Pager [phone] Office [phone]

## 2018-07-27 NOTE — Consult Note (Signed)
Chief Complaint: Patient was seen in consultation today for CT guided aspiration/possible drainage of pelvic fluid collection Chief Complaint  Patient presents with  . Abdominal Pain    Referring Physician(s): Pickens,C  Supervising Physician: Simonne Come  Patient Status: [facility]-IP  History of Present Illness: Kimberly Stuart is a 47 y.o. female with hx abnormal uterine bleeding, s/p hydrothermal endometrial ablation on 07/04/18. She presented to [facility] on 07/18/18 with persistent abd/back pain and uterine bleeding. Imaging has revealed:   8.4 x 4.6 cm irregular fluid collection seen in left adnexal region most consistent with pelvic abscess. It extends superiorly and appears to be resulting in secondary inflammation of adjacent sigmoid colon. Fluid and gas are noted in the endometrial space which may represent postoperative changes, although infection cannot be excluded. These results were called by telephone at the time of interpretation on 07/24/2018 at 5:04 pm to Dr. Donell Beers, who verbally acknowledged these results.  Mild splenomegaly.  Minimal right pleural effusion with adjacent subsegmental Atelectasis.  She is currently afebrile; WBC 7.9, hgb 11.5, plts 482k, PT 15/INR 1.19; request received for CT guided aspiration/possible drainage of the above mentioned pelvic fluid collection. PMH also sig for LLE DVT(currently on eliquis but held for drain procedure).  Past Medical History:  Diagnosis Date  . Anemia   . Vitamin D deficiency   . VTE (venous thromboembolism) 2019   left calf. after OCP used for AUB    Past Surgical History:  Procedure Laterality Date  . DILITATION & CURRETTAGE/HYSTROSCOPY WITH NOVASURE ABLATION N/A 07/14/2018   Procedure: Attempted Novasure Ablation;  Surgeon: Tereso Newcomer, MD;  Location: [facility];  Service: Gynecology;  Laterality: N/A;  . HYSTEROSCOPY N/A 07/14/2018   Procedure: HYSTEROSCOPY WITH HYDROTHERMAL ABLATION;  Surgeon: Tereso Newcomer, MD;  Location: [facility];  Service: Gynecology;  Laterality: N/A;  . LAPAROSCOPY N/A 07/21/2018   Procedure: LAPAROSCOPY DIAGNOSTIC;  Surgeon: Adam Phenix, MD;  Location: [facility];  Service: Gynecology;  Laterality: N/A;  . WISDOM TOOTH EXTRACTION      Allergies: Estrogens  Medications: Prior to Admission medications   Medication Sig Start Date End Date Taking? Authorizing Provider  acetaminophen (TYLENOL) 500 MG tablet Take 500 mg by mouth every 8 (eight) hours as needed for mild pain.   Yes [provider]  apixaban (ELIQUIS) 5 MG TABS tablet Take 1 tablet (5 mg total) by mouth 2 (two) times daily. 07/13/18  Yes Lanelle Bal, MD  docusate sodium (COLACE) 100 MG capsule Take 1 capsule (100 mg total) by mouth 2 (two) times daily as needed for mild constipation or moderate constipation. 07/14/18  Yes Anyanwu, Jethro Bastos, MD  ferrous sulfate 325 (65 FE) MG tablet Take 1 tablet (325 mg total) by mouth 2 (two) times daily with a meal. 07/14/18  Yes Anyanwu, Jethro Bastos, MD  ibuprofen (ADVIL,MOTRIN) 600 MG tablet Take 1 tablet (600 mg total) by mouth every 6 (six) hours as needed for headache, mild pain, moderate pain or cramping. 07/14/18  Yes Anyanwu, Jethro Bastos, MD  Magnesium 250 MG TABS Take 250 mg by mouth daily.   Yes [provider]  megestrol (MEGACE) 40 MG tablet Take 2 tablets (80 mg total) by mouth 2 (two) times daily. 07/14/18  Yes Anyanwu, Jethro Bastos, MD  ondansetron (ZOFRAN) 4 MG tablet Take 1 tablet (4 mg total) by mouth every 6 (six) hours as needed for nausea. 07/13/18  Yes Lanelle Bal, MD  polyethylene glycol (MIRALAX / GLYCOLAX) packet Take 17  g by mouth daily as needed for mild constipation.   Yes [provider]  traMADol (ULTRAM) 50 MG tablet Take 1 tablet (50 mg total) by mouth every 6 (six) hours as needed for severe pain (Postoperative pain). 07/14/18  Yes Anyanwu, Jethro Bastos, MD  acetaminophen (TYLENOL) 325 MG tablet Take 2 tablets (650 mg  total) by mouth every 6 (six) hours as needed for mild pain (or Fever >/= 101). Patient not taking: Reported on 07/18/2018 07/14/18   Guinevere Scarlet A, DO  Multiple Vitamins-Minerals (MULTIVITAMIN) tablet Take 1 tablet by mouth daily. Patient not taking: Reported on 07/05/2018 05/29/18   Calvert Cantor, CNM  simethicone (GAS-X) 80 MG chewable tablet Chew 2 tablets (160 mg total) by mouth every 6 (six) hours as needed (gas pain). Patient not taking: Reported on 07/18/2018 07/14/18   Tereso Newcomer, MD     History reviewed. No pertinent family history.  Social History   Socioeconomic History  . Marital status: Single    Spouse name: Not on file  . Number of children: Not on file  . Years of education: Not on file  . Highest education level: Not on file  Occupational History  . Not on file  Social Needs  . Financial resource strain: Not on file  . Food insecurity:    Worry: Not on file    Inability: Not on file  . Transportation needs:    Medical: Not on file    Non-medical: Not on file  Tobacco Use  . Smoking status: Never Smoker  . Smokeless tobacco: Never Used  Substance and Sexual Activity  . Alcohol use: No  . Drug use: No  . Sexual activity: Not Currently    Birth control/protection: Abstinence  Lifestyle  . Physical activity:    Days per week: Not on file    Minutes per session: Not on file  . Stress: Not on file  Relationships  . Social connections:    Talks on phone: Not on file    Gets together: Not on file    Attends religious service: Not on file    Active member of club or organization: Not on file    Attends meetings of clubs or organizations: Not on file    Relationship status: Not on file  Other Topics Concern  . Not on file  Social History Narrative  . Not on file      Review of Systems denies fever,HA,CP,dyspnea, cough, vomiting ; positive for nausea, abd/back pain, vaginal bleeding  Vital Signs: BP 107/66 (BP Location: Right Arm)   Pulse  72   Temp 98.7 F (37.1 C) (Oral)   Resp 18   Ht 5' 1.5" (1.562 m)   Wt 173 lb 8 oz (78.7 kg)   LMP  (LMP Unknown)   SpO2 99%   BMI 32.25 kg/m   Physical Exam awake/alert; chest- sl dim BS rt base, left clear; heart- RRR; abd- soft, diffusely tender, few BS; LE- no sig edema  Imaging: Ct Abdomen Pelvis Wo Contrast  Result Date: 07/18/2018 CLINICAL DATA:  Lower abdominal and pelvic pain. Bilateral flank pain. Recent hysteroscopy and endometrial ablation. EXAM: CT ABDOMEN AND PELVIS WITHOUT CONTRAST TECHNIQUE: Multidetector CT imaging of the abdomen and pelvis was performed following the standard protocol without IV contrast. COMPARISON:  Ultrasound earlier today.  CT 07/13/2018 FINDINGS: Lower chest: Trace right pleural effusion. Bibasilar dependent atelectasis. Hepatobiliary: No focal hepatic abnormality. Gallbladder unremarkable. Pancreas: No focal abnormality or ductal dilatation. Spleen: No focal  abnormality.  Normal size. Adrenals/Urinary Tract: No renal or ureteral stones. No hydronephrosis. Urinary bladder and adrenal glands unremarkable. Stomach/Bowel: Normal appendix. Scattered sigmoid diverticula. There is inflammatory stranding around the sigmoid colon felt to most likely be secondary to the process in the left adnexa rather than true diverticulitis. No evidence of bowel obstruction. Vascular/Lymphatic: Shotty retroperitoneal lymph nodes, similar to prior study. No evidence of aortic aneurysm. Reproductive: Gas is noted within the endometrial cavity. There is also a gas and fluid collection noted within the left adnexa, corresponding to the abnormality seen on ultrasound earlier today. This area measures approximately 6.7 x 6.1 cm. Differential considerations would include loculated fluid and gas related to recent hysteroscopy or abscess. Is difficult to visualize and separate the left ovary from this process. Other: No ventral hernia. Musculoskeletal: No acute bony abnormality. IMPRESSION:  Gas noted throughout the endometrial canal likely related to recent hysteroscopy and endometrial ablation. Large gas and fluid collection noted in the left adnexa measures 6.7 x 6.1 cm. Cannot exclude pelvic abscess. It is difficult to determine exact location and involvement of the left ovary on this noncontrast study. This would be better evaluated and characterized with contrast-enhanced CT or MRI. Scattered sigmoid diverticulosis. There is mild inflammatory stranding around the sigmoid colon which is adjacent to the large fluid and gas collection in the left adnexa, felt to be secondary inflammation. No renal or ureteral stones.  No hydronephrosis. Electronically Signed   By: Charlett Nose M.D.   On: 07/18/2018 11:18   Ct Abdomen Pelvis Wo Contrast  Result Date: 07/13/2018 CLINICAL DATA:  Severe right lower quadrant abdominal pain and vaginal bleeding post recent biopsy. EXAM: CT ABDOMEN AND PELVIS WITHOUT CONTRAST TECHNIQUE: Multidetector CT imaging of the abdomen and pelvis was performed following the standard protocol without IV contrast. COMPARISON:  Body CT 07/31/2016 FINDINGS: Lower chest: No acute abnormality. Hepatobiliary: No focal liver abnormality is seen. No gallstones, gallbladder wall thickening, or biliary dilatation. Pancreas: Unremarkable. No pancreatic ductal dilatation or surrounding inflammatory changes. Spleen: Normal in size without focal abnormality. Adrenals/Urinary Tract: Adrenal glands are unremarkable. Kidneys are normal, without renal calculi, focal lesion, or hydronephrosis. Bladder is unremarkable. Stomach/Bowel: Stomach is within normal limits. Appendix appears normal. No evidence of bowel wall thickening, distention, or inflammatory changes. Scattered left colonic diverticulosis. Vascular/Lymphatic: Moderately enlarged retroperitoneal lymph nodes, left greater than right measuring up to 14 mm in short axis. Reproductive: The uterus has normal appearance. The ovaries are not  identified. Moderate amount of high density likely hemorrhagic fluid surrounds the uterus. Other: No evidence of free gas within the abdomen. Musculoskeletal: No acute or significant osseous findings. IMPRESSION: Moderate amount of high density probably hemorrhagic fluid within the pelvis adjacent to the uterus Mild retroperitoneal lymphadenopathy, left greater than right. Left colonic diverticulosis. Electronically Signed   By: Ted Mcalpine M.D.   On: 07/13/2018 13:49   Ct Head Wo Contrast  Result Date: 07/13/2018 CLINICAL DATA:  Acute onset severe headache EXAM: CT HEAD WITHOUT CONTRAST TECHNIQUE: Contiguous axial images were obtained from the base of the skull through the vertex without intravenous contrast. COMPARISON:  October 11, 2007 FINDINGS: Brain: The ventricles are normal in size and configuration. There is no intracranial mass, hemorrhage, extra-axial fluid collection, or midline shift. Gray-white compartments are normal. No evident acute infarct. Vascular: No vascular lesions are evident. No vascular calcification appreciable. Skull: Bony calvarium appears intact. Sinuses/Orbits: There is slight mucosal thickening in several ethmoid air cells. Other paranasal sinuses are clear. There is  slight leftward deviation of the nasal septum. Orbits appear symmetric bilaterally. Other: Mastoid air cells are clear. IMPRESSION: Mucosal thickening in several ethmoid air cells. Mild nasal septal deviation. Study otherwise unremarkable. Electronically Signed   By: Bretta Bang III M.D.   On: 07/13/2018 13:34   US Transvaginal Non-ob  Result Date: 07/10/2018 CLINICAL DATA:  Pelvic pain, bleeding EXAM: TRANSABDOMINAL AND TRANSVAGINAL ULTRASOUND OF PELVIS DOPPLER ULTRASOUND OF OVARIES TECHNIQUE: Both transabdominal and transvaginal ultrasound examinations of the pelvis were performed. Transabdominal technique was performed for global imaging of the pelvis including uterus, ovaries, adnexal regions,  and pelvic cul-de-sac. It was necessary to proceed with endovaginal exam following the transabdominal exam to visualize the ovaries/adnexa. Color and duplex Doppler ultrasound was utilized to evaluate blood flow to the ovaries. COMPARISON:  None. FINDINGS: Uterus Measurements: 11.8 x 6.8 x 7.2 cm. No fibroids or other mass visualized. Endometrium Thickness: 19 mm in thickness.  No focal abnormality visualized. Right ovary Measurements: 4.3 x 2.5 x 3.5 cm. Normal appearance/no adnexal mass. Left ovary Measurements: 6.2 x 3.5 x 4.1 cm. Normal appearance/no adnexal mass. Pulsed Doppler evaluation of both ovaries demonstrates normal low-resistance arterial and venous waveforms. Other findings No abnormal free fluid. IMPRESSION: Mildly thickened endometrium. Patient reportedly had recent endometrial biopsy. Recommend correlation with results. Electronically Signed   By: Charlett Nose M.D.   On: 07/10/2018 13:08   US Pelvis (transabdominal Only)  Result Date: 07/18/2018 CLINICAL DATA:  Status post endometrial ablation on 07/14/2018, pain EXAM: TRANSABDOMINAL ULTRASOUND OF PELVIS DOPPLER ULTRASOUND OF OVARIES TECHNIQUE: Transabdominal ultrasound examination of the pelvis was performed including evaluation of the uterus, ovaries, adnexal regions, and pelvic cul-de-sac. Color and duplex Doppler ultrasound was utilized to evaluate blood flow to the ovaries. COMPARISON:  CT abdomen/pelvis dated 07/13/2018 FINDINGS: Uterus Measurements: 14.2 x 7.8 x 7.7 cm. No fibroids or other mass visualized. Endometrium Irregular thickening. Poorly visualized with gas within the endometrial cavity. Right ovary Not discretely visualized due to overlying bowel gas. No adnexal mass is seen. Left ovary Left adnexal ovarian/tubal mass with gas, measuring 8.8 x 6.5 x 6.3 cm. When correlating with prior studies, this likely reflects a combination of normal ovary (poorly visualized), hemorrhage, and gas within the tube. Pulsed Doppler evaluation  demonstrates color and spectral Doppler flow within the left adnexal mass. Other: No free fluid. IMPRESSION: Endometrium is poorly visualized status post ablation, with associated gas, likely postprocedural. Left ovarian/tubal mass with gas, likely postprocedural, poorly evaluated. Associated color and spectral Doppler flow. Right ovary is not discretely visualized. Limited evaluation due to shadowing from the postprocedural gas and limitations of transabdominal evaluation. If symptoms persist, consider CT pelvis (ideally with contrast) or short-term follow-up pelvic ultrasound (ideally when patient is eligible for TV study). Electronically Signed   By: Charline Bills M.D.   On: 07/18/2018 08:50   US Pelvis Complete  Result Date: 07/10/2018 CLINICAL DATA:  Pelvic pain, bleeding EXAM: TRANSABDOMINAL AND TRANSVAGINAL ULTRASOUND OF PELVIS DOPPLER ULTRASOUND OF OVARIES TECHNIQUE: Both transabdominal and transvaginal ultrasound examinations of the pelvis were performed. Transabdominal technique was performed for global imaging of the pelvis including uterus, ovaries, adnexal regions, and pelvic cul-de-sac. It was necessary to proceed with endovaginal exam following the transabdominal exam to visualize the ovaries/adnexa. Color and duplex Doppler ultrasound was utilized to evaluate blood flow to the ovaries. COMPARISON:  None. FINDINGS: Uterus Measurements: 11.8 x 6.8 x 7.2 cm. No fibroids or other mass visualized. Endometrium Thickness: 19 mm in thickness.  No focal abnormality visualized.  Right ovary Measurements: 4.3 x 2.5 x 3.5 cm. Normal appearance/no adnexal mass. Left ovary Measurements: 6.2 x 3.5 x 4.1 cm. Normal appearance/no adnexal mass. Pulsed Doppler evaluation of both ovaries demonstrates normal low-resistance arterial and venous waveforms. Other findings No abnormal free fluid. IMPRESSION: Mildly thickened endometrium. Patient reportedly had recent endometrial biopsy. Recommend correlation with  results. Electronically Signed   By: Charlett Nose M.D.   On: 07/10/2018 13:08   Ct Abdomen Pelvis W Contrast  Result Date: 07/24/2018 CLINICAL DATA:  Lower abdominal and bilateral flank pain. EXAM: CT ABDOMEN AND PELVIS WITH CONTRAST TECHNIQUE: Multidetector CT imaging of the abdomen and pelvis was performed using the standard protocol following bolus administration of intravenous contrast. CONTRAST:  ISOVUE-300 IOPAMIDOL (ISOVUE-300) INJECTION 61% COMPARISON:  CT scan of July 18, 2018. FINDINGS: Lower chest: Minimal right pleural effusion is noted with adjacent subsegmental atelectasis. Hepatobiliary: No gallstones are noted. No biliary dilatation is noted. 19 mm hemangioma seen posteriorly in right hepatic lobe. Pancreas: Unremarkable. No pancreatic ductal dilatation or surrounding inflammatory changes. Spleen: Mild splenomegaly is noted. Adrenals/Urinary Tract: Adrenal glands are unremarkable. Kidneys are normal, without renal calculi, focal lesion, or hydronephrosis. Bladder is unremarkable. Stomach/Bowel: The stomach appears normal. The appendix is unremarkable. There is no evidence of bowel obstruction. Wall thickening and inflammatory changes are seen involving a portion of the sigmoid colon, most likely representing secondary inflammation from left adnexal inflammatory process. Vascular/Lymphatic: No significant vascular abnormality is noted. Left periaortic adenopathy is noted which most likely is inflammatory in etiology. Reproductive: Large amount of gas and fluid is noted in the endometrial space which may represent postoperative findings, although endometritis cannot be excluded. 8.4 x 4.6 cm irregular fluid collection is seen in the left adnexal region most consistent with pelvic abscess. As noted above, it appears to extend superiorly to the sigmoid colon, resulting in secondary inflammation. Other: No hernia is noted. Musculoskeletal: No acute or significant osseous findings.  IMPRESSION: 8.4 x 4.6 cm irregular fluid collection seen in left adnexal region most consistent with pelvic abscess. It extends superiorly and appears to be resulting in secondary inflammation of adjacent sigmoid colon. Fluid and gas are noted in the endometrial space which may represent postoperative changes, although infection cannot be excluded. These results were called by telephone at the time of interpretation on 07/24/2018 at 5:04 pm to Dr. Donell Beers, who verbally acknowledged these results. Mild splenomegaly. Minimal right pleural effusion with adjacent subsegmental atelectasis. Electronically Signed   By: Lupita Raider, M.D.   On: 07/24/2018 17:05   US Pelvic Doppler (torsion R/o Or Mass Arterial Flow)  Result Date: 07/18/2018 CLINICAL DATA:  Status post endometrial ablation on 07/14/2018, pain EXAM: TRANSABDOMINAL ULTRASOUND OF PELVIS DOPPLER ULTRASOUND OF OVARIES TECHNIQUE: Transabdominal ultrasound examination of the pelvis was performed including evaluation of the uterus, ovaries, adnexal regions, and pelvic cul-de-sac. Color and duplex Doppler ultrasound was utilized to evaluate blood flow to the ovaries. COMPARISON:  CT abdomen/pelvis dated 07/13/2018 FINDINGS: Uterus Measurements: 14.2 x 7.8 x 7.7 cm. No fibroids or other mass visualized. Endometrium Irregular thickening. Poorly visualized with gas within the endometrial cavity. Right ovary Not discretely visualized due to overlying bowel gas. No adnexal mass is seen. Left ovary Left adnexal ovarian/tubal mass with gas, measuring 8.8 x 6.5 x 6.3 cm. When correlating with prior studies, this likely reflects a combination of normal ovary (poorly visualized), hemorrhage, and gas within the tube. Pulsed Doppler evaluation demonstrates color and spectral Doppler flow within the left adnexal mass.  Other: No free fluid. IMPRESSION: Endometrium is poorly visualized status post ablation, with associated gas, likely postprocedural. Left ovarian/tubal mass  with gas, likely postprocedural, poorly evaluated. Associated color and spectral Doppler flow. Right ovary is not discretely visualized. Limited evaluation due to shadowing from the postprocedural gas and limitations of transabdominal evaluation. If symptoms persist, consider CT pelvis (ideally with contrast) or short-term follow-up pelvic ultrasound (ideally when patient is eligible for TV study). Electronically Signed   By: Charline Bills M.D.   On: 07/18/2018 08:50   [place] Art/ven Flow Abd Pelv Doppler  Result Date: 07/10/2018 CLINICAL DATA:  Pelvic pain, bleeding EXAM: TRANSABDOMINAL AND TRANSVAGINAL ULTRASOUND OF PELVIS DOPPLER ULTRASOUND OF OVARIES TECHNIQUE: Both transabdominal and transvaginal ultrasound examinations of the pelvis were performed. Transabdominal technique was performed for global imaging of the pelvis including uterus, ovaries, adnexal regions, and pelvic cul-de-sac. It was necessary to proceed with endovaginal exam following the transabdominal exam to visualize the ovaries/adnexa. Color and duplex Doppler ultrasound was utilized to evaluate blood flow to the ovaries. COMPARISON:  None. FINDINGS: Uterus Measurements: 11.8 x 6.8 x 7.2 cm. No fibroids or other mass visualized. Endometrium Thickness: 19 mm in thickness.  No focal abnormality visualized. Right ovary Measurements: 4.3 x 2.5 x 3.5 cm. Normal appearance/no adnexal mass. Left ovary Measurements: 6.2 x 3.5 x 4.1 cm. Normal appearance/no adnexal mass. Pulsed Doppler evaluation of both ovaries demonstrates normal low-resistance arterial and venous waveforms. Other findings No abnormal free fluid. IMPRESSION: Mildly thickened endometrium. Patient reportedly had recent endometrial biopsy. Recommend correlation with results. Electronically Signed   By: Charlett Nose M.D.   On: 07/10/2018 13:08    Labs:  CBC: Recent Labs    07/24/18 0523 07/25/18 0659 07/26/18 0650 07/27/18 0528  WBC 11.0* 9.6 9.6 7.9  HGB 10.2* 11.0* 11.4*  11.5*  HCT 31.2* 34.6* 35.4* 36.2  PLT 456* 480* 469* 482*    COAGS: Recent Labs    07/14/18 1110 07/18/18 1236 07/27/18 0528  INR 1.52 1.57 1.19  APTT 35 41* 37*    BMP: Recent Labs    07/22/18 0633 07/24/18 0523 07/25/18 0659 07/26/18 0650  NA 137 137 134* 136  K 4.9 4.2 3.8 4.2  CL 104 103 101 105  CO2 23 24 21* 21*  GLUCOSE 150* 89 98 96  BUN 11 9 11 8   CALCIUM 8.7* 8.4* 8.2* 8.7*  CREATININE 0.89 0.92 1.03* 0.85  GFRNONAA >60 >60 >60 >60  GFRAA >60 >60 >60 >60    LIVER FUNCTION TESTS: Recent Labs    06/11/18 1726 07/10/18 0619 07/11/18 0600 07/18/18 0619  BILITOT 0.4 0.6 0.9 0.7  AST 17 16 14* 16  ALT 16 11 9 22   ALKPHOS 57 52 47 93  PROT 7.2 6.9 5.6* 6.7  ALBUMIN 3.8 3.5 2.9* 3.0*    TUMOR MARKERS: No results for input(s): AFPTM, CEA, CA199, CHROMGRNA in the last 8760 hours.  Assessment and Plan: 47 y.o. female with hx abnormal uterine bleeding, s/p hydrothermal endometrial ablation on 07/04/18. She presented to [facility] on 07/18/18 with persistent abd/back pain and uterine bleeding. Imaging has revealed:   8.4 x 4.6 cm irregular fluid collection seen in left adnexal region most consistent with pelvic abscess. It extends superiorly and appears to be resulting in secondary inflammation of adjacent sigmoid colon. Fluid and gas are noted in the endometrial space which may represent postoperative changes, although infection cannot be excluded. These results were called by telephone at the time of interpretation on 07/24/2018 at  5:04 pm to Dr. Donell Beers, who verbally acknowledged these results.  Mild splenomegaly.  Minimal right pleural effusion with adjacent subsegmental Atelectasis.  She is currently afebrile; WBC 7.9, hgb 11.5, plts 482k, PT 15/INR 1.19; request received for CT guided aspiration/possible drainage of the above mentioned pelvic fluid collection. PMH also sig for LLE DVT(currently on eliquis but held for drain procedure).Imaging studies have  been reviewed by Dr. Grace Isaac. Risks and benefits discussed with the patient including bleeding, infection, damage to adjacent structures, bowel perforation/fistula connection, and sepsis.  All of the patient's questions were answered, patient is agreeable to proceed. Consent signed and in chart.  Procedure scheduled for this am   Thank you for this interesting consult.  I greatly enjoyed meeting MOHOGANY TOPPINS and look forward to participating in their care.  A copy of this report was sent to the requesting provider on this date.  Electronically Signed: D. Jeananne Rama, PA-C 07/27/2018, 8:56 AM   I spent a total of  25 minutes   in face to face in clinical consultation, greater than 50% of which was counseling/coordinating care for CT guided aspiration/possible drainage of pelvic fluid collection

## 2018-07-27 NOTE — Procedures (Signed)
Pre procedural Dx: Pelvic abscess Post procedural Dx: Same  Technically successful CT guided placed of a 10 Fr drainage catheter placement into the pelvic/lower abdominal abscess yielding 60 cc of purulent fluid.    All aspirated samples sent to the laboratory for analysis.    EBL: Minimal  Complications: None immediate  Katherina Right, MD Pager #: [phone]

## 2018-07-27 NOTE — Progress Notes (Addendum)
Gynecology Progress Note  Admission Date: 07/18/2018 Current Date: 07/27/2018 2:29 PM   Primary Care Provider: [facility] (Dr. Antony Haste)  Kimberly Stuart is a 47 y.o. [id number] HD#10/POD#0 s/p IR pelvic drain placement/POD#6 diagnostic laparoscopy, LOA/POD#13 attempted novasure endometrial ablation->HTA ablation admitted for abdominal pain   History complicated by: Patient Active Problem List   Diagnosis Date Noted  . Diverticulitis 07/23/2018  . Left lower quadrant abdominopelvic abscess (HCC) 07/23/2018  . Abdominal pain 07/18/2018  . Postoperative pain 07/18/2018  . Pelvic pain   . Symptomatic anemia 07/10/2018  . History of cervical dysplasia 07/10/2018  . Abnormal uterine bleeding (AUB) 06/13/2018  . Acute deep vein thrombosis (DVT) of left lower extremity (HCC) 06/13/2018  . Obesity (BMI 30.0-34.9) 06/13/2018    ROS and patient/family/surgical history, located on admission H&P note dated 07/18/2018, have been reviewed, and there are no changes except as noted below Yesterday/Overnight Events:  Patient got IR drain placed this morning with 60mL of purulent fluid out.   Subjective:  Patient got back about an hour ago.  No nausea, vomiting. Pt taking some PO. Pain is fine currently.   Objective:    Current Vital Signs 24h Vital Sign Ranges  T 97.8 F (36.6 C) Temp  Avg: 98.3 F (36.8 C)  Min: 97.8 F (36.6 C)  Max: 98.7 F (37.1 C)  BP 98/72 BP  Min: 88/60  Max: 119/52  HR 76 Pulse  Avg: 68.4  Min: 61  Max: 79  RR 19 Resp  Avg: 20.4  Min: 16  Max: 26  SaO2 100 % Room Air SpO2  Avg: 99.3 %  Min: 96 %  Max: 100 %       24 Hour I/O Current Shift I/O  Time Ins Outs No intake/output data recorded. No intake/output data recorded.   Patient Vitals for the past 24 hrs:  BP Temp Temp src Pulse Resp SpO2  07/27/18 1239 98/72 97.8 F (36.6 C) Oral 76 19 100 %  07/27/18 0724 107/66 98.7 F (37.1 C) Oral 72 18 99 %  07/27/18 0533 109/70 98.1 F (36.7 C) Oral 63  18 100 %  07/26/18 2323 106/67 98.3 F (36.8 C) Oral 65 16 98 %  07/26/18 1942 109/66 98.7 F (37.1 C) Oral 79 18 98 %  07/26/18 1619 112/64 97.9 F (36.6 C) - 66 19 99 %   Last Fever: 38.2 on 9/20  Physical exam: General appearance: alert, cooperative and appears stated age Abdomen: soft, nttp. C/d/i drain dressing in the LLQ. Minimal amount of dark blood/yellowish d/c in tube and bag.  Extremities: no c/c/e Psych: appropriate Neurologic: Grossly normal  Medications  Labs  Recent Labs  Lab 07/25/18 0659 07/26/18 0650 07/27/18 0528  WBC 9.6 9.6 7.9  HGB 11.0* 11.4* 11.5*  HCT 34.6* 35.4* 36.2  PLT 480* 469* 482*    Recent Labs  Lab 07/24/18 0523 07/25/18 0659 07/26/18 0650  NA 137 134* 136  K 4.2 3.8 4.2  CL 103 101 105  CO2 24 21* 21*  BUN 9 11 8   CREATININE 0.92 1.03* 0.85  CALCIUM 8.4* 8.2* 8.7*  GLUCOSE 89 98 96    Radiology INDICATION: Recent history of endometrial ablation, now with fluid collection within the left lower abdomen/pelvis worrisome for abscess of uncertain etiology, potentially adnexal, potentially the sequela of sigmoid diverticulitis.  Please perform CT-guided aspiration and/or drainage catheter placement for infection source control purposes.  EXAM: CT IMAGE GUIDED FLUID DRAIN BY CATHETER  COMPARISON:  CT abdomen pelvis-07/24/2018; 07/18/2018  MEDICATIONS: The patient is currently admitted to the hospital and receiving intravenous antibiotics. The antibiotics were administered within an appropriate time frame prior to the initiation of the procedure.  ANESTHESIA/SEDATION: Moderate (conscious) sedation was employed during this procedure. A total of Versed 3 mg and Fentanyl 200 mcg was administered intravenously.  Moderate Sedation Time: 38 minutes. The patient's level of consciousness and vital signs were monitored continuously by radiology nursing throughout the procedure under my direct supervision.  CONTRAST:   None  COMPLICATIONS: None immediate.  PROCEDURE: Informed written consent was obtained from the patient after a discussion of the risks, benefits and alternatives to treatment. The patient was placed supine on the CT gantry and a pre procedural CT was performed re-demonstrating the known abscess/fluid collection within the left lower abdomen/pelvis with dominant ill-defined component measuring approximately 6.3 x 4.7 cm (image 16, series 2). The procedure was planned. A timeout was performed prior to the initiation of the procedure.  The skin overlying the ventral aspect of the left lower lateral abdomen was prepped and draped in the usual sterile fashion. The overlying soft tissues were anesthetized with 1% lidocaine with epinephrine. Appropriate trajectory was planned with the use of a 22 gauge spinal needle. An 18 gauge trocar needle was advanced into the abscess/fluid collection and a short Amplatz super stiff wire was coiled within the collection. Appropriate positioning was confirmed with a limited CT scan.  Next, the trocar needle was exchanged for a Yueh sheath catheter which was utilized to manipulate the Amplatz wire into the central aspect of the ill-defined fluid collection. Appropriate position was confirmed with CT imaging.  The tract was serially dilated allowing placement of a 10 [place] all-purpose drainage catheter. Appropriate positioning was confirmed with a limited postprocedural CT scan.  60 ml of purulent fluid was aspirated. The tube was connected to a drainage bag and sutured in place. A dressing was placed. The patient tolerated the procedure well without immediate post procedural complication.  IMPRESSION: Successful CT guided placement of a 10 French all purpose drain catheter into the left lower abdomen/pelvis with aspiration of 60 mL of purulent fluid. Samples were sent to the laboratory as requested by the ordering clinical  team.   Electronically Signed   By: Simonne Come M.D.   On: 07/27/2018 12:19    Assessment & Plan:  Pt stable *GYN: see below. Continue bid megace *ID: follow drain output. Switch to PO bactrim and flagyl today to finish out 14d course -s/p zosyn x 6 days -9/23 c diff negative *Heme h/o VTE: d/w IR and restart eliquis tomorrow morning *Pain: continue IV and PO pain meds *FEN/GI: continue regular diet. SLIV *PPx: OOB ad lib, SCD *Dispo: pending continued improvement in s/s. Possibly tomorrow  Code Status: Full Code  Total time taking care of the patient was 15 minutes, with greater than 50% of the time spent in face to face interaction with the patient.  Cornelia Copa MD Attending Center for [facility])

## 2018-07-28 ENCOUNTER — Encounter (HOSPITAL_COMMUNITY): Payer: Self-pay

## 2018-07-28 ENCOUNTER — Ambulatory Visit (HOSPITAL_COMMUNITY)
Admit: 2018-07-28 | Discharge: 2018-07-28 | Disposition: A | Discharge status: Home / Self Care | Payer: MEDICAID | Attending: Obstetrics and Gynecology | Admitting: Obstetrics and Gynecology

## 2018-07-28 LAB — CBC WITH DIFFERENTIAL/PLATELET
BASOS PCT: 0 %
Basophils Absolute: 0 10*3/uL (ref 0.0–0.1)
Eosinophils Absolute: 0.1 10*3/uL (ref 0.0–0.7)
Eosinophils Relative: 2 %
HCT: 37 % (ref 36.0–46.0)
HEMOGLOBIN: 11.9 g/dL — AB (ref 12.0–15.0)
LYMPHS ABS: 1.6 10*3/uL (ref 0.7–4.0)
Lymphocytes Relative: 21 %
MCH: 25.9 pg — AB (ref 26.0–34.0)
MCHC: 32.2 g/dL (ref 30.0–36.0)
MCV: 80.4 fL (ref 78.0–100.0)
MONO ABS: 0.3 10*3/uL (ref 0.1–1.0)
MONOS PCT: 4 %
Neutro Abs: 5.7 10*3/uL (ref 1.7–7.7)
Neutrophils Relative %: 73 %
Platelets: 451 10*3/uL — ABNORMAL HIGH (ref 150–400)
RBC: 4.6 MIL/uL (ref 3.87–5.11)
RDW: 19.2 % — AB (ref 11.5–15.5)
WBC: 7.8 10*3/uL (ref 4.0–10.5)

## 2018-07-28 MED ORDER — BISACODYL 10 MG RE SUPP
10.0000 mg | Freq: Once | RECTAL | Status: AC
  Administered 2018-07-28: 10 mg via RECTAL
  Filled 2018-07-28: qty 1

## 2018-07-28 MED ORDER — SIMETHICONE 80 MG PO CHEW
80.0000 mg | CHEWABLE_TABLET | Freq: Four times a day (QID) | ORAL | Status: DC | PRN
  Administered 2018-07-28 – 2018-07-29 (×2): 80 mg via ORAL
  Filled 2018-07-28 (×2): qty 1

## 2018-07-28 NOTE — Progress Notes (Addendum)
Pt had small, loose bowel movement post suppository. Pt states she feel that most of her pain has been gas related today. Has denied the need for pain medication. Pt encouraged to increase ambulation. Carmelina Dane, RN

## 2018-07-28 NOTE — Progress Notes (Signed)
I followed up with patient, but she was not feeling up for talking at this moment.    Chaplain Dyanne Carrel, BCc PAger, [phone] 2:57 PM   07/28/18 1400  Clinical Encounter Type  Visited With Patient

## 2018-07-28 NOTE — Progress Notes (Signed)
Drain flushed with 10cc NS. Draining small amount of bloody fluid. Dressing is clean, dry, and intact. Carmelina Dane, RN

## 2018-07-28 NOTE — Progress Notes (Signed)
Spoke with RN re: drain.  Remains intact.  Insertion site intact.  25-30 mL serosanguinous output.  Flushing TID and should continue to flush at discharge.  No concerns noted.   Patient for possible discharge tomorrow.  Have placed orders for patient to have outpatient follow-up with Interventional Radiology.  She will hear from schedulers with date and time of appointment.   Loyce Dys, MS RD PA-C 2:28 PM

## 2018-07-28 NOTE — Progress Notes (Signed)
Gynecology Progress Note  Admission Date: 07/18/2018 Current Date: 07/28/2018 11:07 AM   Primary Care Provider: [facility] (Dr. Antony Haste)  Kimberly Stuart is a 47 y.o. [id number] HD#11/POD#1 s/p IR pelvic drain placement/POD#7 diagnostic laparoscopy, LOA/POD#14 attempted novasure endometrial ablation->HTA ablation admitted for abdominal pain   History complicated by: Patient Active Problem List   Diagnosis Date Noted  . Diverticulitis 07/23/2018  . Left lower quadrant abdominopelvic abscess (HCC) 07/23/2018  . Abdominal pain 07/18/2018  . Postoperative pain 07/18/2018  . Pelvic pain   . Symptomatic anemia 07/10/2018  . History of cervical dysplasia 07/10/2018  . Abnormal uterine bleeding (AUB) 06/13/2018  . Acute deep vein thrombosis (DVT) of left lower extremity (HCC) 06/13/2018  . Obesity (BMI 30.0-34.9) 06/13/2018    ROS and patient/family/surgical history, located on admission H&P note dated 07/18/2018, have been reviewed, and there are no changes except as noted below Yesterday/Overnight Events:  none  Subjective:  Pt stable. Pt states diffuse pain feels better but having pain at the drain site.   Objective:    Current Vital Signs 24h Vital Sign Ranges  T 98.6 F (37 C) Temp  Avg: 98.3 F (36.8 C)  Min: 97.7 F (36.5 C)  Max: 98.9 F (37.2 C)  BP 98/71 BP  Min: 98/71  Max: 117/86  HR 76 Pulse  Avg: 71  Min: 66  Max: 76  RR 18 Resp  Avg: 18.6  Min: 18  Max: 20  SaO2 98 % Room Air SpO2  Avg: 99 %  Min: 98 %  Max: 100 %       24 Hour I/O Current Shift I/O  Time Ins Outs 09/26 0701 - 09/27 0700 In: 255 [P.O.:240] Out: 780 [Urine:750; Drains:30] No intake/output data recorded.   Patient Vitals for the past 24 hrs:  BP Temp Temp src Pulse Resp SpO2  07/28/18 0737 98/71 98.6 F (37 C) Oral 76 18 98 %  07/28/18 0424 106/72 98.9 F (37.2 C) Oral 71 18 99 %  07/27/18 2305 101/71 98.4 F (36.9 C) Oral 75 18 99 %  07/27/18 1926 114/69 97.7 F (36.5 C) Oral  71 18 98 %  07/27/18 1633 110/73 98.3 F (36.8 C) - 70 19 98 %  07/27/18 1239 98/72 97.8 F (36.6 C) Oral 76 19 100 %  JP: output 10-6mL since yesterday  Last Fever: 38.2 on 9/20  Physical exam: General appearance: alert, cooperative and appears stated age Abdomen: soft, nttp. C/d/i drain dressing in the LLQ. Minimal amount of serosang fluid in tube and bag.  Extremities: no c/c/e Psych: appropriate Neurologic: Grossly normal  Medications  Labs  Recent Labs  Lab 07/26/18 0650 07/27/18 0528 07/28/18 0712  WBC 9.6 7.9 7.8  HGB 11.4* 11.5* 11.9*  HCT 35.4* 36.2 37.0  PLT 469* 482* 451*    Recent Labs  Lab 07/24/18 0523 07/25/18 0659 07/26/18 0650  NA 137 134* 136  K 4.2 3.8 4.2  CL 103 101 105  CO2 24 21* 21*  BUN 9 11 8   CREATININE 0.92 1.03* 0.85  CALCIUM 8.4* 8.2* 8.7*  GLUCOSE 89 98 96    Radiology No new imaging    Assessment & Plan:  Pt stable *GYN: see below. Continue bid megace *ID: follow drain output. Continue bactrim/flagyl D#2. D/w pt that if s/s continue how they are then should be fine for d/c to home tomorrow.  -s/p zosyn x 6 days -9/23 c diff negative *Heme h/o VTE: restart eliquis today *Pain:  continue PO pain meds *FEN/GI: continue regular diet. SLIV *PPx: OOB ad lib, SCD *Dispo: likely tomorrow  Code Status: Full Code  Total time taking care of the patient was 15 minutes, with greater than 50% of the time spent in face to face interaction with the patient.  Cornelia Copa MD Attending Center for [facility] Healthcare [facility])

## 2018-07-29 MED ORDER — SULFAMETHOXAZOLE-TRIMETHOPRIM 800-160 MG PO TABS: 1.0000 | ORAL_TABLET | Freq: Two times a day (BID) | ORAL | 0 refills | Status: AC

## 2018-07-29 MED ORDER — OXYCODONE HCL 5 MG PO TABS: 5.0000 mg | ORAL_TABLET | ORAL | 0 refills | Status: DC | PRN

## 2018-07-29 MED ORDER — METRONIDAZOLE 500 MG PO TABS: 500.0000 mg | ORAL_TABLET | Freq: Two times a day (BID) | ORAL | 0 refills | Status: AC

## 2018-07-29 NOTE — Discharge Instructions (Signed)
Pelvic Inflammatory Disease °Pelvic inflammatory disease (PID) refers to an infection in some or all of the female organs. The infection can be in the uterus, ovaries, fallopian tubes, or the surrounding tissues in the pelvis. PID can cause abdominal or pelvic pain that comes on suddenly (acute pelvic pain). PID is a serious infection because it can lead to lasting (chronic) pelvic pain or the inability to have children (infertility). °What are the causes? °This condition is most often caused by an infection that is spread during sexual contact. However, the infection can also be caused by the normal bacteria that are found in the vaginal tissues if these bacteria travel upward into the reproductive organs. PID can also occur following: °· The birth of a baby. °· A miscarriage. °· An abortion. °· Major pelvic surgery. °· The use of an intrauterine device (IUD). °· A sexual assault. ° °What increases the risk? °This condition is more likely to develop in women who: °· Are younger than 46 years of age. °· Are sexually active at a young age. °· Use nonbarrier contraception. °· Have multiple sexual partners. °· Have sex with someone who has symptoms of an STD (sexually transmitted disease). °· Use oral contraception. ° °At times, certain behaviors can also increase the possibility of getting PID, such as: °· Using a vaginal douche. °· Having an IUD in place. ° °What are the signs or symptoms? °Symptoms of this condition include: °· Abdominal or pelvic pain. °· Fever. °· Chills. °· Abnormal vaginal discharge. °· Abnormal uterine bleeding. °· Unusual pain shortly after the end of a menstrual period. °· Painful urination. °· Pain with sexual intercourse. °· Nausea and vomiting. ° °How is this diagnosed? °To diagnose this condition, your health care provider will do a physical exam and take your medical history. A pelvic exam typically reveals great tenderness in the uterus and the surrounding pelvic tissues. You may also  have tests, such as: °· Lab tests, including a pregnancy test, blood tests, and urine test. °· Culture tests of the vagina and cervix to check for an STD. °· Ultrasound. °· A laparoscopic procedure to look inside the pelvis. °· Examining vaginal secretions under a microscope. ° °How is this treated? °Treatment for this condition may involve one or more approaches. °· Antibiotic medicines may be prescribed to be taken by mouth. °· Sexual partners may need to be treated if the infection is caused by an STD. °· For more severe cases, hospitalization may be needed to give antibiotics directly into a vein through an IV tube. °· Surgery may be needed if other treatments do not help, but this is rare. ° °It may take weeks until you are completely well. If you are diagnosed with PID, you should also be checked for human immunodeficiency virus (HIV). Your health care provider may test you for infection again 3 months after treatment. You should not have unprotected sex. °Follow these instructions at home: °· Take over-the-counter and prescription medicines only as told by your health care provider. °· If you were prescribed an antibiotic medicine, take it as told by your health care provider. Do not stop taking the antibiotic even if you start to feel better. °· Do not have sexual intercourse until treatment is completed or as told by your health care provider. If PID is confirmed, your recent sexual partners will need treatment, especially if you had unprotected sex. °· Keep all follow-up visits as told by your health care provider. This is important. °Contact a health care   provider if: °· You have increased or abnormal vaginal discharge. °· Your pain does not improve. °· You vomit. °· You have a fever. °· You cannot tolerate your medicines. °· Your partner has an STD. °· You have pain when you urinate. °Get help right away if: °· You have increased abdominal or pelvic pain. °· You have chills. °· Your symptoms are not  better in 72 hours even with treatment. °This information is not intended to replace advice given to you by your health care provider. Make sure you discuss any questions you have with your health care provider. °Document Released: 10/18/2005 Document Revised: 03/25/2016 Document Reviewed: 11/25/2014 °Elsevier Interactive Patient Education © 2018 [employer] ° °

## 2018-07-29 NOTE — Progress Notes (Signed)
Discharged home via w/c. 

## 2018-07-29 NOTE — Discharge Summary (Addendum)
Physician Discharge Summary  Patient ID: Kimberly Stuart MRN: [id number] DOB/AGE: 1971-09-08 47 y.o.  Admit date: 07/18/2018 Discharge date:   Admission Diagnoses:  Principal Problem:   Left lower quadrant abdominopelvic abscess (HCC) Active Problems:   Abnormal uterine bleeding (AUB)   Acute deep vein thrombosis (DVT) of left lower extremity (HCC)   Symptomatic anemia   Abdominal pain   Postoperative pain   Discharge Diagnoses:  Same  Past Medical History:  Diagnosis Date  . Anemia   . Vitamin D deficiency   . VTE (venous thromboembolism) 2019   left calf. after OCP used for AUB    Surgeries: Procedure(s): LAPAROSCOPY DIAGNOSTIC on 07/21/2018 IR drainage of intra-abdominal abscess   Consultants: General Surgery Interventional Radiology  Discharged Condition: Improved  Hospital Course: Kimberly Stuart is an 47 y.o. female [id number] who was admitted 07/18/2018 with a chief complaint of  Chief Complaint  Patient presents with  . Abdominal Pain  , and found to have a diagnosis of Left lower quadrant abdominal abscess (HCC). Patient with complicated history including AUB with start of OC's in 7/19. Promptly developed DVT and begun on Xarelto. Bleeding became much heavier. Hospitalized for tx and begun on Megace. Following this underwent attempt at [facility] followed by HTA 4 days prior to admission.  She had fever, chills, abdominal pain. Initially thought to be postoperative changes from HTA. It was felt she had blood in abdomen from procedure. She was watched and monitored and treated for pain for 3 days without improvement and was taken to the OR for diagnosis. Found pelvic abscess with overlying bowel adherent. Started on Zosyn and watched with ? Diverticulitis. General Surgery consulted and reviewed CT, thought to most certainly be adnexal process with secondary bowel involvement. Developed diarrhea, tested negative for enteric pathogens. Spiked fever on day of surgery. IR  consulted and had to hold Eliquis x 2 days, then IR drained 60 cc of purulent material on HD #9. Culture shows Strep Viridans, + GNR. No further fevers. Tolerating po. Antibiotics changed to oral and thought to be stable for discharge. Pain significantly improved following IR drainage. Drain remains in place.   Anti-infectives (From admission, onward)   Start     Dose/Rate Route Frequency Ordered Stop   07/29/18 0000  metroNIDAZOLE (FLAGYL) 500 MG tablet     500 mg Oral Every 12 hours 07/29/18 1029 08/05/18 2359   07/29/18 0000  sulfamethoxazole-trimethoprim (BACTRIM DS,SEPTRA DS) 800-160 MG tablet     1 tablet Oral Every 12 hours 07/29/18 1029 08/05/18 2359   07/27/18 1300  sulfamethoxazole-trimethoprim (BACTRIM DS,SEPTRA DS) 800-160 MG per tablet 1 tablet     1 tablet Oral Every 12 hours 07/27/18 1234 08/04/18 0959   07/27/18 1300  metroNIDAZOLE (FLAGYL) tablet 500 mg     500 mg Oral Every 12 hours 07/27/18 1234 08/04/18 0959   07/21/18 2000  piperacillin-tazobactam (ZOSYN) IVPB 3.375 g  Status:  Discontinued     3.375 g 12.5 mL/hr over 240 Minutes Intravenous Every 8 hours 07/21/18 1513 07/27/18 1238   07/21/18 1200  piperacillin-tazobactam (ZOSYN) IVPB 3.375 g  Status:  Discontinued     3.375 g 100 mL/hr over 30 Minutes Intravenous Every 6 hours 07/21/18 1140 07/21/18 1513      Discharge exam: Recent vital signs:  Vitals:   07/29/18 0355 07/29/18 0824  BP: 118/68 101/65  Pulse: 72 66  Resp: 17 16  Temp: 97.6 F (36.4 C) 99 F (37.2 C)  SpO2: 97% 97%  Physical Examination: General appearance - alert, well appearing, and in no distress Neck - supple, no significant adenopathy Chest - normal effort Heart - normal rate and regular rhythm Abdomen - soft, TTP LLQ, no rebound Neurological - alert, oriented, normal speech, no focal findings or movement disorder noted   Recent laboratory studies:  Results for orders placed or performed during the hospital encounter of 07/18/18   Culture, Urine  Result Value Ref Range   Specimen Description      URINE, RANDOM Performed at [facility], [address], [place], [place] [address]    Special Requests      NONE Performed at [facility], [address], [place], [place] [address]    Culture      NO GROWTH Performed at [facility], [address], [place], [place] [address]    Report Status 07/19/2018 FINAL   MRSA PCR Screening  Result Value Ref Range   MRSA by PCR NEGATIVE NEGATIVE  Gastrointestinal Panel by PCR , Stool  Result Value Ref Range   Campylobacter species NOT DETECTED NOT DETECTED   Plesimonas shigelloides NOT DETECTED NOT DETECTED   Salmonella species NOT DETECTED NOT DETECTED   Yersinia enterocolitica NOT DETECTED NOT DETECTED   Vibrio species NOT DETECTED NOT DETECTED   Vibrio cholerae NOT DETECTED NOT DETECTED   Enteroaggregative E coli (EAEC) NOT DETECTED NOT DETECTED   Enteropathogenic E coli (EPEC) NOT DETECTED NOT DETECTED   Enterotoxigenic E coli (ETEC) NOT DETECTED NOT DETECTED   Shiga like toxin producing E coli (STEC) NOT DETECTED NOT DETECTED   Shigella/Enteroinvasive E coli (EIEC) NOT DETECTED NOT DETECTED   Cryptosporidium NOT DETECTED NOT DETECTED   Cyclospora cayetanensis NOT DETECTED NOT DETECTED   Entamoeba histolytica NOT DETECTED NOT DETECTED   Giardia lamblia NOT DETECTED NOT DETECTED   Adenovirus F40/41 NOT DETECTED NOT DETECTED   Astrovirus NOT DETECTED NOT DETECTED   Norovirus GI/GII NOT DETECTED NOT DETECTED   Rotavirus A NOT DETECTED NOT DETECTED   Sapovirus (I, II, IV, and V) NOT DETECTED NOT DETECTED  C difficile quick scan w PCR reflex  Result Value Ref Range   C Diff antigen NEGATIVE NEGATIVE   C Diff toxin NEGATIVE NEGATIVE   C Diff interpretation No C. difficile detected.   Urinalysis, Routine w reflex microscopic  Result Value Ref Range   Color, Urine YELLOW YELLOW   APPearance CLOUDY (A) CLEAR   Specific Gravity, Urine 1.021 1.005 -  1.030   pH 6.0 5.0 - 8.0   Glucose, UA NEGATIVE NEGATIVE mg/dL   Hgb urine dipstick LARGE (A) NEGATIVE   Bilirubin Urine NEGATIVE NEGATIVE   Ketones, ur NEGATIVE NEGATIVE mg/dL   Protein, ur 30 (A) NEGATIVE mg/dL   Nitrite NEGATIVE NEGATIVE   Leukocytes, UA LARGE (A) NEGATIVE   RBC / HPF >50 (H) 0 - 5 RBC/hpf   WBC, UA >50 (H) 0 - 5 WBC/hpf   Bacteria, UA RARE (A) NONE SEEN   Squamous Epithelial / LPF 0-5 0 - 5   WBC Clumps PRESENT    Mucus PRESENT   CBC with Differential/Platelet  Result Value Ref Range   WBC 7.9 4.0 - 10.5 K/uL   RBC 3.06 (L) 3.87 - 5.11 MIL/uL   Hemoglobin 8.0 (L) 12.0 - 15.0 g/dL   HCT 91.4 (L) 78.2 - 95.6 %   MCV 80.1 78.0 - 100.0 fL   MCH 26.1 26.0 - 34.0 pg   MCHC 32.7 30.0 - 36.0 g/dL  RDW 20.4 (H) 11.5 - 15.5 %   Platelets 264 150 - 400 K/uL   Neutrophils Relative % 81 %   Neutro Abs 6.4 1.7 - 7.7 K/uL   Lymphocytes Relative 13 %   Lymphs Abs 1.1 0.7 - 4.0 K/uL   Monocytes Relative 5 %   Monocytes Absolute 0.4 0.1 - 1.0 K/uL   Eosinophils Relative 1 %   Eosinophils Absolute 0.1 0.0 - 0.7 K/uL   Basophils Relative 0 %   Basophils Absolute 0.0 0.0 - 0.1 K/uL  Comprehensive metabolic panel  Result Value Ref Range   Sodium 135 135 - 145 mmol/L   Potassium 3.2 (L) 3.5 - 5.1 mmol/L   Chloride 103 98 - 111 mmol/L   CO2 19 (L) 22 - 32 mmol/L   Glucose, Bld 124 (H) 70 - 99 mg/dL   BUN 12 6 - 20 mg/dL   Creatinine, Ser 2.95 (H) 0.44 - 1.00 mg/dL   Calcium 8.4 (L) 8.9 - 10.3 mg/dL   Total Protein 6.7 6.5 - 8.1 g/dL   Albumin 3.0 (L) 3.5 - 5.0 g/dL   AST 16 15 - 41 U/L   ALT 22 0 - 44 U/L   Alkaline Phosphatase 93 38 - 126 U/L   Total Bilirubin 0.7 0.3 - 1.2 mg/dL   GFR calc non Af Amer >60 >60 mL/min   GFR calc Af Amer >60 >60 mL/min   Anion gap 13 5 - 15  APTT  Result Value Ref Range   aPTT 41 (H) 24 - 36 seconds  Protime-INR  Result Value Ref Range   Prothrombin Time 18.7 (H) 11.4 - 15.2 seconds   INR 1.57   Fibrinogen  Result Value Ref  Range   Fibrinogen 703 (H) 210 - 475 mg/dL  Basic metabolic panel  Result Value Ref Range   Sodium 133 (L) 135 - 145 mmol/L   Potassium 3.6 3.5 - 5.1 mmol/L   Chloride 104 98 - 111 mmol/L   CO2 21 (L) 22 - 32 mmol/L   Glucose, Bld 96 70 - 99 mg/dL   BUN 6 6 - 20 mg/dL   Creatinine, Ser 6.21 0.44 - 1.00 mg/dL   Calcium 8.3 (L) 8.9 - 10.3 mg/dL   GFR calc non Af Amer >60 >60 mL/min   GFR calc Af Amer >60 >60 mL/min   Anion gap 8 5 - 15  CBC  Result Value Ref Range   WBC 8.1 4.0 - 10.5 K/uL   RBC 2.97 (L) 3.87 - 5.11 MIL/uL   Hemoglobin 7.6 (L) 12.0 - 15.0 g/dL   HCT 30.8 (L) 65.7 - 84.6 %   MCV 80.1 78.0 - 100.0 fL   MCH 25.6 (L) 26.0 - 34.0 pg   MCHC 31.9 30.0 - 36.0 g/dL   RDW 96.2 (H) 95.2 - 84.1 %   Platelets 323 150 - 400 K/uL  Hemoglobin and hematocrit, blood  Result Value Ref Range   Hemoglobin 9.1 (L) 12.0 - 15.0 g/dL   HCT 32.4 (L) 40.1 - 02.7 %  CBC  Result Value Ref Range   WBC 12.8 (H) 4.0 - 10.5 K/uL   RBC 4.11 3.87 - 5.11 MIL/uL   Hemoglobin 10.5 (L) 12.0 - 15.0 g/dL   HCT 25.3 (L) 66.4 - 40.3 %   MCV 80.5 78.0 - 100.0 fL   MCH 25.5 (L) 26.0 - 34.0 pg   MCHC 31.7 30.0 - 36.0 g/dL   RDW 47.4 (H) 25.9 - 56.3 %  Platelets 444 (H) 150 - 400 K/uL  Basic metabolic panel  Result Value Ref Range   Sodium 137 135 - 145 mmol/L   Potassium 4.9 3.5 - 5.1 mmol/L   Chloride 104 98 - 111 mmol/L   CO2 23 22 - 32 mmol/L   Glucose, Bld 150 (H) 70 - 99 mg/dL   BUN 11 6 - 20 mg/dL   Creatinine, Ser 4.09 0.44 - 1.00 mg/dL   Calcium 8.7 (L) 8.9 - 10.3 mg/dL   GFR calc non Af Amer >60 >60 mL/min   GFR calc Af Amer >60 >60 mL/min   Anion gap 10 5 - 15  Glucose, capillary  Result Value Ref Range   Glucose-Capillary 109 (H) 70 - 99 mg/dL  CBC with Differential/Platelet  Result Value Ref Range   WBC 11.0 (H) 4.0 - 10.5 K/uL   RBC 3.85 (L) 3.87 - 5.11 MIL/uL   Hemoglobin 10.2 (L) 12.0 - 15.0 g/dL   HCT 81.1 (L) 91.4 - 78.2 %   MCV 81.0 78.0 - 100.0 fL   MCH 26.5 26.0  - 34.0 pg   MCHC 32.7 30.0 - 36.0 g/dL   RDW 95.6 (H) 21.3 - 08.6 %   Platelets 456 (H) 150 - 400 K/uL   Neutrophils Relative % 80 %   Neutro Abs 8.8 (H) 1.7 - 7.7 K/uL   Lymphocytes Relative 12 %   Lymphs Abs 1.4 0.7 - 4.0 K/uL   Monocytes Relative 7 %   Monocytes Absolute 0.7 0.1 - 1.0 K/uL   Eosinophils Relative 1 %   Eosinophils Absolute 0.1 0.0 - 0.7 K/uL   Basophils Relative 0 %   Basophils Absolute 0.0 0.0 - 0.1 K/uL  Sedimentation rate  Result Value Ref Range   Sed Rate 72 (H) 0 - 22 mm/hr  Basic metabolic panel  Result Value Ref Range   Sodium 137 135 - 145 mmol/L   Potassium 4.2 3.5 - 5.1 mmol/L   Chloride 103 98 - 111 mmol/L   CO2 24 22 - 32 mmol/L   Glucose, Bld 89 70 - 99 mg/dL   BUN 9 6 - 20 mg/dL   Creatinine, Ser 5.78 0.44 - 1.00 mg/dL   Calcium 8.4 (L) 8.9 - 10.3 mg/dL   GFR calc non Af Amer >60 >60 mL/min   GFR calc Af Amer >60 >60 mL/min   Anion gap 10 5 - 15  CBC with Differential/Platelet  Result Value Ref Range   WBC 9.6 4.0 - 10.5 K/uL   RBC 4.29 3.87 - 5.11 MIL/uL   Hemoglobin 11.0 (L) 12.0 - 15.0 g/dL   HCT 46.9 (L) 62.9 - 52.8 %   MCV 80.7 78.0 - 100.0 fL   MCH 25.6 (L) 26.0 - 34.0 pg   MCHC 31.8 30.0 - 36.0 g/dL   RDW 41.3 (H) 24.4 - 01.0 %   Platelets 480 (H) 150 - 400 K/uL   Neutrophils Relative % 77 %   Neutro Abs 7.5 1.7 - 7.7 K/uL   Lymphocytes Relative 16 %   Lymphs Abs 1.5 0.7 - 4.0 K/uL   Monocytes Relative 5 %   Monocytes Absolute 0.4 0.1 - 1.0 K/uL   Eosinophils Relative 2 %   Eosinophils Absolute 0.2 0.0 - 0.7 K/uL   Basophils Relative 0 %   Basophils Absolute 0.0 0.0 - 0.1 K/uL  Basic metabolic panel  Result Value Ref Range   Sodium 134 (L) 135 - 145 mmol/L   Potassium 3.8 3.5 -  5.1 mmol/L   Chloride 101 98 - 111 mmol/L   CO2 21 (L) 22 - 32 mmol/L   Glucose, Bld 98 70 - 99 mg/dL   BUN 11 6 - 20 mg/dL   Creatinine, Ser 1.61 (H) 0.44 - 1.00 mg/dL   Calcium 8.2 (L) 8.9 - 10.3 mg/dL   GFR calc non Af Amer >60 >60 mL/min    GFR calc Af Amer >60 >60 mL/min   Anion gap 12 5 - 15  CBC with Differential/Platelet  Result Value Ref Range   WBC 9.6 4.0 - 10.5 K/uL   RBC 4.41 3.87 - 5.11 MIL/uL   Hemoglobin 11.4 (L) 12.0 - 15.0 g/dL   HCT 09.6 (L) 04.5 - 40.9 %   MCV 80.3 78.0 - 100.0 fL   MCH 25.9 (L) 26.0 - 34.0 pg   MCHC 32.2 30.0 - 36.0 g/dL   RDW 81.1 (H) 91.4 - 78.2 %   Platelets 469 (H) 150 - 400 K/uL   Neutrophils Relative % 76 %   Neutro Abs 7.3 1.7 - 7.7 K/uL   Lymphocytes Relative 18 %   Lymphs Abs 1.7 0.7 - 4.0 K/uL   Monocytes Relative 3 %   Monocytes Absolute 0.3 0.1 - 1.0 K/uL   Eosinophils Relative 2 %   Eosinophils Absolute 0.2 0.0 - 0.7 K/uL   Basophils Relative 1 %   Basophils Absolute 0.1 0.0 - 0.1 K/uL  Basic metabolic panel  Result Value Ref Range   Sodium 136 135 - 145 mmol/L   Potassium 4.2 3.5 - 5.1 mmol/L   Chloride 105 98 - 111 mmol/L   CO2 21 (L) 22 - 32 mmol/L   Glucose, Bld 96 70 - 99 mg/dL   BUN 8 6 - 20 mg/dL   Creatinine, Ser 9.56 0.44 - 1.00 mg/dL   Calcium 8.7 (L) 8.9 - 10.3 mg/dL   GFR calc non Af Amer >60 >60 mL/min   GFR calc Af Amer >60 >60 mL/min   Anion gap 10 5 - 15  Protime-INR  Result Value Ref Range   Prothrombin Time 15.0 11.4 - 15.2 seconds   INR 1.19   APTT  Result Value Ref Range   aPTT 37 (H) 24 - 36 seconds  CBC with Differential/Platelet  Result Value Ref Range   WBC 7.9 4.0 - 10.5 K/uL   RBC 4.47 3.87 - 5.11 MIL/uL   Hemoglobin 11.5 (L) 12.0 - 15.0 g/dL   HCT 21.3 08.6 - 57.8 %   MCV 81.0 78.0 - 100.0 fL   MCH 25.7 (L) 26.0 - 34.0 pg   MCHC 31.8 30.0 - 36.0 g/dL   RDW 46.9 (H) 62.9 - 52.8 %   Platelets 482 (H) 150 - 400 K/uL   Neutrophils Relative % 77 %   Neutro Abs 6.0 1.7 - 7.7 K/uL   Lymphocytes Relative 18 %   Lymphs Abs 1.5 0.7 - 4.0 K/uL   Monocytes Relative 3 %   Monocytes Absolute 0.2 0.1 - 1.0 K/uL   Eosinophils Relative 2 %   Eosinophils Absolute 0.2 0.0 - 0.7 K/uL   Basophils Relative 0 %   Basophils Absolute 0.0  0.0 - 0.1 K/uL  CBC with Differential/Platelet  Result Value Ref Range   WBC 7.8 4.0 - 10.5 K/uL   RBC 4.60 3.87 - 5.11 MIL/uL   Hemoglobin 11.9 (L) 12.0 - 15.0 g/dL   HCT 41.3 24.4 - 01.0 %   MCV 80.4 78.0 - 100.0 fL  MCH 25.9 (L) 26.0 - 34.0 pg   MCHC 32.2 30.0 - 36.0 g/dL   RDW 16.1 (H) 09.6 - 04.5 %   Platelets 451 (H) 150 - 400 K/uL   Neutrophils Relative % 73 %   Neutro Abs 5.7 1.7 - 7.7 K/uL   Lymphocytes Relative 21 %   Lymphs Abs 1.6 0.7 - 4.0 K/uL   Monocytes Relative 4 %   Monocytes Absolute 0.3 0.1 - 1.0 K/uL   Eosinophils Relative 2 %   Eosinophils Absolute 0.1 0.0 - 0.7 K/uL   Basophils Relative 0 %   Basophils Absolute 0.0 0.0 - 0.1 K/uL  Pregnancy, urine POC  Result Value Ref Range   Preg Test, Ur NEGATIVE NEGATIVE  Prepare RBC  Result Value Ref Range   Order Confirmation      ORDER PROCESSED BY BLOOD BANK Performed at [facility], [address], [place], [place] [address]   Type and screen [facility] [place]  Result Value Ref Range   ABO/RH(D) A POS    Antibody Screen NEG    Sample Expiration 07/22/2018    Unit Number [id number]    Blood Component Type RED CELLS,LR    Unit division 00    Status of Unit ISSUED,FINAL    Transfusion Status OK TO TRANSFUSE    Crossmatch Result      Compatible Performed at [facility], [address], [place], [place] [address]    Unit Number [id number]    Blood Component Type RED CELLS,LR    Unit division 00    Status of Unit ISSUED,FINAL    Transfusion Status OK TO TRANSFUSE    Crossmatch Result Compatible   Type and screen [facility] [place]  Result Value Ref Range   ABO/RH(D) A POS    Antibody Screen NEG    Sample Expiration      07/28/2018 Performed at [facility], [address] [place], [place] [address]   BPAM [facility]  Result Value Ref Range   ISSUE DATE / TIME [id number]    Blood Product Unit Number [id number]    PRODUCT CODE E0336V00    Unit Type and  Rh 6200    Blood Product Expiration Date [id number]    ISSUE DATE / TIME [id number]    Blood Product Unit Number [id number]    PRODUCT CODE [id number]    Unit Type and Rh 6200    Blood Product Expiration Date [id number]     Discharge Medications:   Allergies as of 07/29/2018      Reactions   Estrogens Other (See Comments)   DVT      Medication List    STOP taking these medications   traMADol 50 MG tablet Commonly known as:  ULTRAM     TAKE these medications   acetaminophen 325 MG tablet Commonly known as:  TYLENOL Take 2 tablets (650 mg total) by mouth every 6 (six) hours as needed for mild pain (or Fever >/= 101). What changed:  Another medication with the same name was removed. Continue taking this medication, and follow the directions you see here.   apixaban 5 MG Tabs tablet Commonly known as:  ELIQUIS Take 1 tablet (5 mg total) by mouth 2 (two) times daily.   docusate sodium 100 MG capsule Commonly known as:  COLACE Take 1 capsule (100 mg total) by mouth 2 (two) times daily as needed for mild constipation or moderate constipation.   ferrous sulfate 325 (65 FE) MG tablet Take 1 tablet (  325 mg total) by mouth 2 (two) times daily with a meal.   ibuprofen 600 MG tablet Commonly known as:  ADVIL,MOTRIN Take 1 tablet (600 mg total) by mouth every 6 (six) hours as needed for headache, mild pain, moderate pain or cramping.   Magnesium 250 MG Tabs Take 250 mg by mouth daily.   megestrol 40 MG tablet Commonly known as:  MEGACE Take 2 tablets (80 mg total) by mouth 2 (two) times daily.   metroNIDAZOLE 500 MG tablet Commonly known as:  FLAGYL Take 1 tablet (500 mg total) by mouth every 12 (twelve) hours for 7 days.   multivitamin tablet Take 1 tablet by mouth daily.   ondansetron 4 MG tablet Commonly known as:  ZOFRAN Take 1 tablet (4 mg total) by mouth every 6 (six) hours as needed for nausea.   oxyCODONE 5 MG immediate release tablet Commonly known as:   Oxy IR/ROXICODONE Take 1-2 tablets (5-10 mg total) by mouth every 4 (four) hours as needed for severe pain.   polyethylene glycol packet Commonly known as:  MIRALAX / GLYCOLAX Take 17 g by mouth daily as needed for mild constipation.   simethicone 80 MG chewable tablet Commonly known as:  MYLICON Chew 2 tablets (160 mg total) by mouth every 6 (six) hours as needed (gas pain).   sulfamethoxazole-trimethoprim 800-160 MG tablet Commonly known as:  BACTRIM DS,SEPTRA DS Take 1 tablet by mouth every 12 (twelve) hours for 7 days.       Diagnostic Studies: Ct Abdomen Pelvis Wo Contrast  Result Date: 07/18/2018 CLINICAL DATA:  Lower abdominal and pelvic pain. Bilateral flank pain. Recent hysteroscopy and endometrial ablation. EXAM: CT ABDOMEN AND PELVIS WITHOUT CONTRAST TECHNIQUE: Multidetector CT imaging of the abdomen and pelvis was performed following the standard protocol without IV contrast. COMPARISON:  Ultrasound earlier today.  CT 07/13/2018 FINDINGS: Lower chest: Trace right pleural effusion. Bibasilar dependent atelectasis. Hepatobiliary: No focal hepatic abnormality. Gallbladder unremarkable. Pancreas: No focal abnormality or ductal dilatation. Spleen: No focal abnormality.  Normal size. Adrenals/Urinary Tract: No renal or ureteral stones. No hydronephrosis. Urinary bladder and adrenal glands unremarkable. Stomach/Bowel: Normal appendix. Scattered sigmoid diverticula. There is inflammatory stranding around the sigmoid colon felt to most likely be secondary to the process in the left adnexa rather than true diverticulitis. No evidence of bowel obstruction. Vascular/Lymphatic: Shotty retroperitoneal lymph nodes, similar to prior study. No evidence of aortic aneurysm. Reproductive: Gas is noted within the endometrial cavity. There is also a gas and fluid collection noted within the left adnexa, corresponding to the abnormality seen on ultrasound earlier today. This area measures approximately 6.7  x 6.1 cm. Differential considerations would include loculated fluid and gas related to recent hysteroscopy or abscess. Is difficult to visualize and separate the left ovary from this process. Other: No ventral hernia. Musculoskeletal: No acute bony abnormality. IMPRESSION: Gas noted throughout the endometrial canal likely related to recent hysteroscopy and endometrial ablation. Large gas and fluid collection noted in the left adnexa measures 6.7 x 6.1 cm. Cannot exclude pelvic abscess. It is difficult to determine exact location and involvement of the left ovary on this noncontrast study. This would be better evaluated and characterized with contrast-enhanced CT or MRI. Scattered sigmoid diverticulosis. There is mild inflammatory stranding around the sigmoid colon which is adjacent to the large fluid and gas collection in the left adnexa, felt to be secondary inflammation. No renal or ureteral stones.  No hydronephrosis. Electronically Signed   By: Charlett Nose M.D.  On: 07/18/2018 11:18   Ct Abdomen Pelvis Wo Contrast  Result Date: 07/13/2018 CLINICAL DATA:  Severe right lower quadrant abdominal pain and vaginal bleeding post recent biopsy. EXAM: CT ABDOMEN AND PELVIS WITHOUT CONTRAST TECHNIQUE: Multidetector CT imaging of the abdomen and pelvis was performed following the standard protocol without IV contrast. COMPARISON:  Body CT 07/31/2016 FINDINGS: Lower chest: No acute abnormality. Hepatobiliary: No focal liver abnormality is seen. No gallstones, gallbladder wall thickening, or biliary dilatation. Pancreas: Unremarkable. No pancreatic ductal dilatation or surrounding inflammatory changes. Spleen: Normal in size without focal abnormality. Adrenals/Urinary Tract: Adrenal glands are unremarkable. Kidneys are normal, without renal calculi, focal lesion, or hydronephrosis. Bladder is unremarkable. Stomach/Bowel: Stomach is within normal limits. Appendix appears normal. No evidence of bowel wall thickening,  distention, or inflammatory changes. Scattered left colonic diverticulosis. Vascular/Lymphatic: Moderately enlarged retroperitoneal lymph nodes, left greater than right measuring up to 14 mm in short axis. Reproductive: The uterus has normal appearance. The ovaries are not identified. Moderate amount of high density likely hemorrhagic fluid surrounds the uterus. Other: No evidence of free gas within the abdomen. Musculoskeletal: No acute or significant osseous findings. IMPRESSION: Moderate amount of high density probably hemorrhagic fluid within the pelvis adjacent to the uterus Mild retroperitoneal lymphadenopathy, left greater than right. Left colonic diverticulosis. Electronically Signed   By: Ted Mcalpine M.D.   On: 07/13/2018 13:49   Ct Head Wo Contrast  Result Date: 07/13/2018 CLINICAL DATA:  Acute onset severe headache EXAM: CT HEAD WITHOUT CONTRAST TECHNIQUE: Contiguous axial images were obtained from the base of the skull through the vertex without intravenous contrast. COMPARISON:  October 11, 2007 FINDINGS: Brain: The ventricles are normal in size and configuration. There is no intracranial mass, hemorrhage, extra-axial fluid collection, or midline shift. Gray-white compartments are normal. No evident acute infarct. Vascular: No vascular lesions are evident. No vascular calcification appreciable. Skull: Bony calvarium appears intact. Sinuses/Orbits: There is slight mucosal thickening in several ethmoid air cells. Other paranasal sinuses are clear. There is slight leftward deviation of the nasal septum. Orbits appear symmetric bilaterally. Other: Mastoid air cells are clear. IMPRESSION: Mucosal thickening in several ethmoid air cells. Mild nasal septal deviation. Study otherwise unremarkable. Electronically Signed   By: Bretta Bang III M.D.   On: 07/13/2018 13:34   US Transvaginal Non-ob  Result Date: 07/10/2018 CLINICAL DATA:  Pelvic pain, bleeding EXAM: TRANSABDOMINAL AND  TRANSVAGINAL ULTRASOUND OF PELVIS DOPPLER ULTRASOUND OF OVARIES TECHNIQUE: Both transabdominal and transvaginal ultrasound examinations of the pelvis were performed. Transabdominal technique was performed for global imaging of the pelvis including uterus, ovaries, adnexal regions, and pelvic cul-de-sac. It was necessary to proceed with endovaginal exam following the transabdominal exam to visualize the ovaries/adnexa. Color and duplex Doppler ultrasound was utilized to evaluate blood flow to the ovaries. COMPARISON:  None. FINDINGS: Uterus Measurements: 11.8 x 6.8 x 7.2 cm. No fibroids or other mass visualized. Endometrium Thickness: 19 mm in thickness.  No focal abnormality visualized. Right ovary Measurements: 4.3 x 2.5 x 3.5 cm. Normal appearance/no adnexal mass. Left ovary Measurements: 6.2 x 3.5 x 4.1 cm. Normal appearance/no adnexal mass. Pulsed Doppler evaluation of both ovaries demonstrates normal low-resistance arterial and venous waveforms. Other findings No abnormal free fluid. IMPRESSION: Mildly thickened endometrium. Patient reportedly had recent endometrial biopsy. Recommend correlation with results. Electronically Signed   By: Charlett Nose M.D.   On: 07/10/2018 13:08   US Pelvis (transabdominal Only)  Result Date: 07/18/2018 CLINICAL DATA:  Status post endometrial ablation on 07/14/2018, pain  EXAM: TRANSABDOMINAL ULTRASOUND OF PELVIS DOPPLER ULTRASOUND OF OVARIES TECHNIQUE: Transabdominal ultrasound examination of the pelvis was performed including evaluation of the uterus, ovaries, adnexal regions, and pelvic cul-de-sac. Color and duplex Doppler ultrasound was utilized to evaluate blood flow to the ovaries. COMPARISON:  CT abdomen/pelvis dated 07/13/2018 FINDINGS: Uterus Measurements: 14.2 x 7.8 x 7.7 cm. No fibroids or other mass visualized. Endometrium Irregular thickening. Poorly visualized with gas within the endometrial cavity. Right ovary Not discretely visualized due to overlying bowel  gas. No adnexal mass is seen. Left ovary Left adnexal ovarian/tubal mass with gas, measuring 8.8 x 6.5 x 6.3 cm. When correlating with prior studies, this likely reflects a combination of normal ovary (poorly visualized), hemorrhage, and gas within the tube. Pulsed Doppler evaluation demonstrates color and spectral Doppler flow within the left adnexal mass. Other: No free fluid. IMPRESSION: Endometrium is poorly visualized status post ablation, with associated gas, likely postprocedural. Left ovarian/tubal mass with gas, likely postprocedural, poorly evaluated. Associated color and spectral Doppler flow. Right ovary is not discretely visualized. Limited evaluation due to shadowing from the postprocedural gas and limitations of transabdominal evaluation. If symptoms persist, consider CT pelvis (ideally with contrast) or short-term follow-up pelvic ultrasound (ideally when patient is eligible for TV study). Electronically Signed   By: Charline Bills M.D.   On: 07/18/2018 08:50   US Pelvis Complete  Result Date: 07/10/2018 CLINICAL DATA:  Pelvic pain, bleeding EXAM: TRANSABDOMINAL AND TRANSVAGINAL ULTRASOUND OF PELVIS DOPPLER ULTRASOUND OF OVARIES TECHNIQUE: Both transabdominal and transvaginal ultrasound examinations of the pelvis were performed. Transabdominal technique was performed for global imaging of the pelvis including uterus, ovaries, adnexal regions, and pelvic cul-de-sac. It was necessary to proceed with endovaginal exam following the transabdominal exam to visualize the ovaries/adnexa. Color and duplex Doppler ultrasound was utilized to evaluate blood flow to the ovaries. COMPARISON:  None. FINDINGS: Uterus Measurements: 11.8 x 6.8 x 7.2 cm. No fibroids or other mass visualized. Endometrium Thickness: 19 mm in thickness.  No focal abnormality visualized. Right ovary Measurements: 4.3 x 2.5 x 3.5 cm. Normal appearance/no adnexal mass. Left ovary Measurements: 6.2 x 3.5 x 4.1 cm. Normal appearance/no  adnexal mass. Pulsed Doppler evaluation of both ovaries demonstrates normal low-resistance arterial and venous waveforms. Other findings No abnormal free fluid. IMPRESSION: Mildly thickened endometrium. Patient reportedly had recent endometrial biopsy. Recommend correlation with results. Electronically Signed   By: Charlett Nose M.D.   On: 07/10/2018 13:08   Ct Abdomen Pelvis W Contrast  Result Date: 07/24/2018 CLINICAL DATA:  Lower abdominal and bilateral flank pain. EXAM: CT ABDOMEN AND PELVIS WITH CONTRAST TECHNIQUE: Multidetector CT imaging of the abdomen and pelvis was performed using the standard protocol following bolus administration of intravenous contrast. CONTRAST:  ISOVUE-300 IOPAMIDOL (ISOVUE-300) INJECTION 61% COMPARISON:  CT scan of July 18, 2018. FINDINGS: Lower chest: Minimal right pleural effusion is noted with adjacent subsegmental atelectasis. Hepatobiliary: No gallstones are noted. No biliary dilatation is noted. 19 mm hemangioma seen posteriorly in right hepatic lobe. Pancreas: Unremarkable. No pancreatic ductal dilatation or surrounding inflammatory changes. Spleen: Mild splenomegaly is noted. Adrenals/Urinary Tract: Adrenal glands are unremarkable. Kidneys are normal, without renal calculi, focal lesion, or hydronephrosis. Bladder is unremarkable. Stomach/Bowel: The stomach appears normal. The appendix is unremarkable. There is no evidence of bowel obstruction. Wall thickening and inflammatory changes are seen involving a portion of the sigmoid colon, most likely representing secondary inflammation from left adnexal inflammatory process. Vascular/Lymphatic: No significant vascular abnormality is noted. Left periaortic adenopathy is noted  which most likely is inflammatory in etiology. Reproductive: Large amount of gas and fluid is noted in the endometrial space which may represent postoperative findings, although endometritis cannot be excluded. 8.4 x 4.6 cm irregular fluid  collection is seen in the left adnexal region most consistent with pelvic abscess. As noted above, it appears to extend superiorly to the sigmoid colon, resulting in secondary inflammation. Other: No hernia is noted. Musculoskeletal: No acute or significant osseous findings. IMPRESSION: 8.4 x 4.6 cm irregular fluid collection seen in left adnexal region most consistent with pelvic abscess. It extends superiorly and appears to be resulting in secondary inflammation of adjacent sigmoid colon. Fluid and gas are noted in the endometrial space which may represent postoperative changes, although infection cannot be excluded. These results were called by telephone at the time of interpretation on 07/24/2018 at 5:04 pm to Dr. Donell Beers, who verbally acknowledged these results. Mild splenomegaly. Minimal right pleural effusion with adjacent subsegmental atelectasis. Electronically Signed   By: Lupita Raider, M.D.   On: 07/24/2018 17:05   US Pelvic Doppler (torsion R/o Or Mass Arterial Flow)  Result Date: 07/18/2018 CLINICAL DATA:  Status post endometrial ablation on 07/14/2018, pain EXAM: TRANSABDOMINAL ULTRASOUND OF PELVIS DOPPLER ULTRASOUND OF OVARIES TECHNIQUE: Transabdominal ultrasound examination of the pelvis was performed including evaluation of the uterus, ovaries, adnexal regions, and pelvic cul-de-sac. Color and duplex Doppler ultrasound was utilized to evaluate blood flow to the ovaries. COMPARISON:  CT abdomen/pelvis dated 07/13/2018 FINDINGS: Uterus Measurements: 14.2 x 7.8 x 7.7 cm. No fibroids or other mass visualized. Endometrium Irregular thickening. Poorly visualized with gas within the endometrial cavity. Right ovary Not discretely visualized due to overlying bowel gas. No adnexal mass is seen. Left ovary Left adnexal ovarian/tubal mass with gas, measuring 8.8 x 6.5 x 6.3 cm. When correlating with prior studies, this likely reflects a combination of normal ovary (poorly visualized), hemorrhage, and gas  within the tube. Pulsed Doppler evaluation demonstrates color and spectral Doppler flow within the left adnexal mass. Other: No free fluid. IMPRESSION: Endometrium is poorly visualized status post ablation, with associated gas, likely postprocedural. Left ovarian/tubal mass with gas, likely postprocedural, poorly evaluated. Associated color and spectral Doppler flow. Right ovary is not discretely visualized. Limited evaluation due to shadowing from the postprocedural gas and limitations of transabdominal evaluation. If symptoms persist, consider CT pelvis (ideally with contrast) or short-term follow-up pelvic ultrasound (ideally when patient is eligible for TV study). Electronically Signed   By: Charline Bills M.D.   On: 07/18/2018 08:50   [place] Art/ven Flow Abd Pelv Doppler  Result Date: 07/10/2018 CLINICAL DATA:  Pelvic pain, bleeding EXAM: TRANSABDOMINAL AND TRANSVAGINAL ULTRASOUND OF PELVIS DOPPLER ULTRASOUND OF OVARIES TECHNIQUE: Both transabdominal and transvaginal ultrasound examinations of the pelvis were performed. Transabdominal technique was performed for global imaging of the pelvis including uterus, ovaries, adnexal regions, and pelvic cul-de-sac. It was necessary to proceed with endovaginal exam following the transabdominal exam to visualize the ovaries/adnexa. Color and duplex Doppler ultrasound was utilized to evaluate blood flow to the ovaries. COMPARISON:  None. FINDINGS: Uterus Measurements: 11.8 x 6.8 x 7.2 cm. No fibroids or other mass visualized. Endometrium Thickness: 19 mm in thickness.  No focal abnormality visualized. Right ovary Measurements: 4.3 x 2.5 x 3.5 cm. Normal appearance/no adnexal mass. Left ovary Measurements: 6.2 x 3.5 x 4.1 cm. Normal appearance/no adnexal mass. Pulsed Doppler evaluation of both ovaries demonstrates normal low-resistance arterial and venous waveforms. Other findings No abnormal free fluid. IMPRESSION: Mildly thickened endometrium.  Patient reportedly had  recent endometrial biopsy. Recommend correlation with results. Electronically Signed   By: Charlett Nose M.D.   On: 07/10/2018 13:08   Ct Image Guided Fluid Drain By Catheter  Result Date: 07/27/2018 INDICATION: Recent history of endometrial ablation, now with fluid collection within the left lower abdomen/pelvis worrisome for abscess of uncertain etiology, potentially adnexal, potentially the sequela of sigmoid diverticulitis. Please perform CT-guided aspiration and/or drainage catheter placement for infection source control purposes. EXAM: CT IMAGE GUIDED FLUID DRAIN BY CATHETER COMPARISON:  CT abdomen pelvis-07/24/2018; 07/18/2018 MEDICATIONS: The patient is currently admitted to the hospital and receiving intravenous antibiotics. The antibiotics were administered within an appropriate time frame prior to the initiation of the procedure. ANESTHESIA/SEDATION: Moderate (conscious) sedation was employed during this procedure. A total of Versed 3 mg and Fentanyl 200 mcg was administered intravenously. Moderate Sedation Time: 38 minutes. The patient's level of consciousness and vital signs were monitored continuously by radiology nursing throughout the procedure under my direct supervision. CONTRAST:  None COMPLICATIONS: None immediate. PROCEDURE: Informed written consent was obtained from the patient after a discussion of the risks, benefits and alternatives to treatment. The patient was placed supine on the CT gantry and a pre procedural CT was performed re-demonstrating the known abscess/fluid collection within the left lower abdomen/pelvis with dominant ill-defined component measuring approximately 6.3 x 4.7 cm (image 16, series 2). The procedure was planned. A timeout was performed prior to the initiation of the procedure. The skin overlying the ventral aspect of the left lower lateral abdomen was prepped and draped in the usual sterile fashion. The overlying soft tissues were anesthetized with 1% lidocaine  with epinephrine. Appropriate trajectory was planned with the use of a 22 gauge spinal needle. An 18 gauge trocar needle was advanced into the abscess/fluid collection and a short Amplatz super stiff wire was coiled within the collection. Appropriate positioning was confirmed with a limited CT scan. Next, the trocar needle was exchanged for a Yueh sheath catheter which was utilized to manipulate the Amplatz wire into the central aspect of the ill-defined fluid collection. Appropriate position was confirmed with CT imaging. The tract was serially dilated allowing placement of a 10 [place] all-purpose drainage catheter. Appropriate positioning was confirmed with a limited postprocedural CT scan. 60 ml of purulent fluid was aspirated. The tube was connected to a drainage bag and sutured in place. A dressing was placed. The patient tolerated the procedure well without immediate post procedural complication. IMPRESSION: Successful CT guided placement of a 10 French all purpose drain catheter into the left lower abdomen/pelvis with aspiration of 60 mL of purulent fluid. Samples were sent to the laboratory as requested by the ordering clinical team. Electronically Signed   By: Simonne Come M.D.   On: 07/27/2018 12:19    Disposition: Discharge disposition: 01-Home or Self Care       Discharge Instructions    Call MD for:  persistant nausea and vomiting   Complete by:  As directed    Call MD for:  redness, tenderness, or signs of infection (pain, swelling, redness, odor or green/yellow discharge around incision site)   Complete by:  As directed    Call MD for:  severe uncontrolled pain   Complete by:  As directed    Call MD for:  temperature >100.4   Complete by:  As directed    Diet - low sodium heart healthy   Complete by:  As directed    Discharge instructions   Complete by:  As directed    Keep drain insertion site clean and dry.  May change gauze/dressing as needed to keep clean.  Continue to flush  with 5-10 mL sterile saline daily.  Record output daily and bring a log with you to outpatient appointments.  You will hear from Interventional Radiology schedulers with date and time of follow-up appointment.   Driving Restrictions   Complete by:  As directed    None while on narcotic pain meds   Increase activity slowly   Complete by:  As directed    Lifting restrictions   Complete by:  As directed    Nothing > 20 lbs x 1 wk   Sexual Activity Restrictions   Complete by:  As directed    None x 2 wks      Follow-up Information    [facility] up in 2 week(s).   Specialty:  Obstetrics and Gynecology Why:  They will call you with an appointment Contact information: [address] [place] [address] [phone]       [facility] INTERVENTIONAL RADIOLOGY. Schedule an appointment as soon as possible for a visit.   Specialty:  Radiology Contact information: [address] [id number] [place] [address] [phone]         Time spent on discharge is 36 minutes  Signed: Reva Bores 07/29/2018, 10:37 AM

## 2018-07-29 NOTE — Progress Notes (Signed)
Discharge instructions given to patient and mother with demonstration of how to irrigate and empty abscess drain. Patient verbalized understanding of all instructions given.

## 2018-07-31 ENCOUNTER — Other Ambulatory Visit: Payer: Self-pay | Admitting: Family Medicine

## 2018-07-31 DIAGNOSIS — N739 Female pelvic inflammatory disease, unspecified: Secondary | ICD-10-CM

## 2018-08-01 LAB — AEROBIC/ANAEROBIC CULTURE W GRAM STAIN (SURGICAL/DEEP WOUND)

## 2018-08-01 LAB — AEROBIC/ANAEROBIC CULTURE (SURGICAL/DEEP WOUND): SPECIAL REQUESTS: NORMAL

## 2018-08-01 MED FILL — NORMAL SALINE FLUSH SYRINGE: 0.9 | 9 days supply | Qty: 180 | Fill #0

## 2018-08-08 ENCOUNTER — Inpatient Hospital Stay (INDEPENDENT_AMBULATORY_CARE_PROVIDER_SITE_OTHER): Payer: Medicaid Other | Admitting: Physician Assistant

## 2018-08-08 ENCOUNTER — Ambulatory Visit
Admission: RE | Admit: 2018-08-08 | Discharge: 2018-08-08 | Disposition: A | Discharge status: Home / Self Care | Payer: Medicaid Other | Source: Ambulatory Visit | Attending: Family Medicine | Admitting: Family Medicine

## 2018-08-08 ENCOUNTER — Encounter: Payer: Self-pay | Admitting: Radiology

## 2018-08-08 ENCOUNTER — Ambulatory Visit
Admission: RE | Admit: 2018-08-08 | Discharge: 2018-08-08 | Disposition: A | Discharge status: Home / Self Care | Payer: Medicaid Other | Source: Ambulatory Visit | Attending: Student | Admitting: Student

## 2018-08-08 DIAGNOSIS — N739 Female pelvic inflammatory disease, unspecified: Secondary | ICD-10-CM

## 2018-08-08 HISTORY — PX: IR RADIOLOGIST EVAL & MGMT: IMG5224

## 2018-08-08 MED ORDER — IOPAMIDOL (ISOVUE-300) INJECTION 61%
100.0000 mL | Freq: Once | INTRAVENOUS | Status: AC | PRN
  Administered 2018-08-08: 100 mL via INTRAVENOUS

## 2018-08-08 NOTE — Progress Notes (Signed)
Patient ID: Kimberly Stuart, female   DOB: Aug 21, 1971, 47 y.o.   MRN: [id number]         Chief Complaint: Percutaneous drainage catheter evaluation and management.  Referring Physician(s): Axel Filler  History of Present Illness: Kimberly Stuart is a 47 y.o. female with past medical history significant for DVT who underwent recent endometrial ablation complicated by development of indeterminate abscess within the left lower abdomen/pelvis potentially adnexal in etiology, potentially the sequela of concomitant adjacent sigmoid diverticulitis.  Patient ultimately underwent CT-guided peritoneal drainage catheter placement on 07/27/2018 for infection source control purposes.  Patient presents today to the interventional radiology drain clinic for drainage catheter evaluation and management.  The patient is accompanied by her mother though serves as her own historian.  Patient reports little to no output from the percutaneous catheter for the past several days.  She continues to flush the percutaneous drainage catheter.  Patient continues to take her prescribed course of antibiotics.  Patient denies worsening abdominal or pelvic pain.  No fever or chills.  Past Medical History:  Diagnosis Date  . Anemia   . Vitamin D deficiency   . VTE (venous thromboembolism) 2019   left calf. after OCP used for AUB    Past Surgical History:  Procedure Laterality Date  . DILITATION & CURRETTAGE/HYSTROSCOPY WITH NOVASURE ABLATION N/A 07/14/2018   Procedure: Attempted Novasure Ablation;  Surgeon: Tereso Newcomer, MD;  Location: [facility];  Service: Gynecology;  Laterality: N/A;  . HYSTEROSCOPY N/A 07/14/2018   Procedure: HYSTEROSCOPY WITH HYDROTHERMAL ABLATION;  Surgeon: Tereso Newcomer, MD;  Location: [facility];  Service: Gynecology;  Laterality: N/A;  . IR RADIOLOGIST EVAL & MGMT  08/08/2018  . LAPAROSCOPY N/A 07/21/2018   Procedure: LAPAROSCOPY DIAGNOSTIC;  Surgeon: Adam Phenix,  MD;  Location: [facility];  Service: Gynecology;  Laterality: N/A;  . WISDOM TOOTH EXTRACTION      Allergies: Estrogens  Medications: Prior to Admission medications   Medication Sig Start Date End Date Taking? Authorizing Provider  acetaminophen (TYLENOL) 325 MG tablet Take 2 tablets (650 mg total) by mouth every 6 (six) hours as needed for mild pain (or Fever >/= 101). Patient not taking: Reported on 07/18/2018 07/14/18   Seawell, Richmond Campbell A, DO  apixaban (ELIQUIS) 5 MG TABS tablet Take 1 tablet (5 mg total) by mouth 2 (two) times daily. 07/13/18   Lanelle Bal, MD  docusate sodium (COLACE) 100 MG capsule Take 1 capsule (100 mg total) by mouth 2 (two) times daily as needed for mild constipation or moderate constipation. 07/14/18   Anyanwu, Jethro Bastos, MD  ferrous sulfate 325 (65 FE) MG tablet Take 1 tablet (325 mg total) by mouth 2 (two) times daily with a meal. 07/14/18   Anyanwu, Jethro Bastos, MD  ibuprofen (ADVIL,MOTRIN) 600 MG tablet Take 1 tablet (600 mg total) by mouth every 6 (six) hours as needed for headache, mild pain, moderate pain or cramping. 07/14/18   Anyanwu, Jethro Bastos, MD  Magnesium 250 MG TABS Take 250 mg by mouth daily.    [provider]  megestrol (MEGACE) 40 MG tablet Take 2 tablets (80 mg total) by mouth 2 (two) times daily. 07/14/18   Tereso Newcomer, MD  Multiple Vitamins-Minerals (MULTIVITAMIN) tablet Take 1 tablet by mouth daily. Patient not taking: Reported on 07/05/2018 05/29/18   Calvert Cantor, CNM  ondansetron (ZOFRAN) 4 MG tablet Take 1 tablet (4 mg total) by mouth every 6 (six) hours as needed for nausea.  07/13/18   Lanelle Bal, MD  oxyCODONE (OXY IR/ROXICODONE) 5 MG immediate release tablet Take 1-2 tablets (5-10 mg total) by mouth every 4 (four) hours as needed for severe pain. 07/29/18   Reva Bores, MD  polyethylene glycol [facility] Ethelene Hal) packet Take 17 g by mouth daily as needed for mild constipation.    [provider]    simethicone (GAS-X) 80 MG chewable tablet Chew 2 tablets (160 mg total) by mouth every 6 (six) hours as needed (gas pain). Patient not taking: Reported on 07/18/2018 07/14/18   Tereso Newcomer, MD     No family history on file.  Social History   Socioeconomic History  . Marital status: Single    Spouse name: Not on file  . Number of children: Not on file  . Years of education: Not on file  . Highest education level: Not on file  Occupational History  . Not on file  Social Needs  . Financial resource strain: Not on file  . Food insecurity:    Worry: Not on file    Inability: Not on file  . Transportation needs:    Medical: Not on file    Non-medical: Not on file  Tobacco Use  . Smoking status: Never Smoker  . Smokeless tobacco: Never Used  Substance and Sexual Activity  . Alcohol use: No  . Drug use: No  . Sexual activity: Not Currently    Birth control/protection: Abstinence  Lifestyle  . Physical activity:    Days per week: Not on file    Minutes per session: Not on file  . Stress: Not on file  Relationships  . Social connections:    Talks on phone: Not on file    Gets together: Not on file    Attends religious service: Not on file    Active member of club or organization: Not on file    Attends meetings of clubs or organizations: Not on file    Relationship status: Not on file  Other Topics Concern  . Not on file  Social History Narrative  . Not on file    ECOG Status: 1 - Symptomatic but completely ambulatory  Review of Systems: A 12 point ROS discussed and pertinent positives are indicated in the HPI above.  All other systems are negative.  Review of Systems  Constitutional: Negative for activity change and fever.  Respiratory: Negative.   Cardiovascular: Negative.   Gastrointestinal: Negative for abdominal pain.    Vital Signs: BP 137/72   Pulse 71   Temp 98 F (36.7 C)   LMP  (LMP Unknown)   SpO2 99%   Physical Exam  Abdominal:     Location of the patient's percutaneous drainage catheter.  Nursing note and vitals reviewed.   Mallampati Score:     Imaging: Ct Abdomen Pelvis Wo Contrast  Result Date: 07/18/2018 CLINICAL DATA:  Lower abdominal and pelvic pain. Bilateral flank pain. Recent hysteroscopy and endometrial ablation. EXAM: CT ABDOMEN AND PELVIS WITHOUT CONTRAST TECHNIQUE: Multidetector CT imaging of the abdomen and pelvis was performed following the standard protocol without IV contrast. COMPARISON:  Ultrasound earlier today.  CT 07/13/2018 FINDINGS: Lower chest: Trace right pleural effusion. Bibasilar dependent atelectasis. Hepatobiliary: No focal hepatic abnormality. Gallbladder unremarkable. Pancreas: No focal abnormality or ductal dilatation. Spleen: No focal abnormality.  Normal size. Adrenals/Urinary Tract: No renal or ureteral stones. No hydronephrosis. Urinary bladder and adrenal glands unremarkable. Stomach/Bowel: Normal appendix. Scattered sigmoid diverticula. There is inflammatory stranding around the  sigmoid colon felt to most likely be secondary to the process in the left adnexa rather than true diverticulitis. No evidence of bowel obstruction. Vascular/Lymphatic: Shotty retroperitoneal lymph nodes, similar to prior study. No evidence of aortic aneurysm. Reproductive: Gas is noted within the endometrial cavity. There is also a gas and fluid collection noted within the left adnexa, corresponding to the abnormality seen on ultrasound earlier today. This area measures approximately 6.7 x 6.1 cm. Differential considerations would include loculated fluid and gas related to recent hysteroscopy or abscess. Is difficult to visualize and separate the left ovary from this process. Other: No ventral hernia. Musculoskeletal: No acute bony abnormality. IMPRESSION: Gas noted throughout the endometrial canal likely related to recent hysteroscopy and endometrial ablation. Large gas and fluid collection noted in the left  adnexa measures 6.7 x 6.1 cm. Cannot exclude pelvic abscess. It is difficult to determine exact location and involvement of the left ovary on this noncontrast study. This would be better evaluated and characterized with contrast-enhanced CT or MRI. Scattered sigmoid diverticulosis. There is mild inflammatory stranding around the sigmoid colon which is adjacent to the large fluid and gas collection in the left adnexa, felt to be secondary inflammation. No renal or ureteral stones.  No hydronephrosis. Electronically Signed   By: Charlett Nose M.D.   On: 07/18/2018 11:18   Ct Abdomen Pelvis Wo Contrast  Result Date: 07/13/2018 CLINICAL DATA:  Severe right lower quadrant abdominal pain and vaginal bleeding post recent biopsy. EXAM: CT ABDOMEN AND PELVIS WITHOUT CONTRAST TECHNIQUE: Multidetector CT imaging of the abdomen and pelvis was performed following the standard protocol without IV contrast. COMPARISON:  Body CT 07/31/2016 FINDINGS: Lower chest: No acute abnormality. Hepatobiliary: No focal liver abnormality is seen. No gallstones, gallbladder wall thickening, or biliary dilatation. Pancreas: Unremarkable. No pancreatic ductal dilatation or surrounding inflammatory changes. Spleen: Normal in size without focal abnormality. Adrenals/Urinary Tract: Adrenal glands are unremarkable. Kidneys are normal, without renal calculi, focal lesion, or hydronephrosis. Bladder is unremarkable. Stomach/Bowel: Stomach is within normal limits. Appendix appears normal. No evidence of bowel wall thickening, distention, or inflammatory changes. Scattered left colonic diverticulosis. Vascular/Lymphatic: Moderately enlarged retroperitoneal lymph nodes, left greater than right measuring up to 14 mm in short axis. Reproductive: The uterus has normal appearance. The ovaries are not identified. Moderate amount of high density likely hemorrhagic fluid surrounds the uterus. Other: No evidence of free gas within the abdomen. Musculoskeletal:  No acute or significant osseous findings. IMPRESSION: Moderate amount of high density probably hemorrhagic fluid within the pelvis adjacent to the uterus Mild retroperitoneal lymphadenopathy, left greater than right. Left colonic diverticulosis. Electronically Signed   By: Ted Mcalpine M.D.   On: 07/13/2018 13:49   Ct Head Wo Contrast  Result Date: 07/13/2018 CLINICAL DATA:  Acute onset severe headache EXAM: CT HEAD WITHOUT CONTRAST TECHNIQUE: Contiguous axial images were obtained from the base of the skull through the vertex without intravenous contrast. COMPARISON:  October 11, 2007 FINDINGS: Brain: The ventricles are normal in size and configuration. There is no intracranial mass, hemorrhage, extra-axial fluid collection, or midline shift. Gray-white compartments are normal. No evident acute infarct. Vascular: No vascular lesions are evident. No vascular calcification appreciable. Skull: Bony calvarium appears intact. Sinuses/Orbits: There is slight mucosal thickening in several ethmoid air cells. Other paranasal sinuses are clear. There is slight leftward deviation of the nasal septum. Orbits appear symmetric bilaterally. Other: Mastoid air cells are clear. IMPRESSION: Mucosal thickening in several ethmoid air cells. Mild nasal septal deviation. Study otherwise  unremarkable. Electronically Signed   By: Bretta Bang III M.D.   On: 07/13/2018 13:34   US Transvaginal Non-ob  Result Date: 07/10/2018 CLINICAL DATA:  Pelvic pain, bleeding EXAM: TRANSABDOMINAL AND TRANSVAGINAL ULTRASOUND OF PELVIS DOPPLER ULTRASOUND OF OVARIES TECHNIQUE: Both transabdominal and transvaginal ultrasound examinations of the pelvis were performed. Transabdominal technique was performed for global imaging of the pelvis including uterus, ovaries, adnexal regions, and pelvic cul-de-sac. It was necessary to proceed with endovaginal exam following the transabdominal exam to visualize the ovaries/adnexa. Color and duplex  Doppler ultrasound was utilized to evaluate blood flow to the ovaries. COMPARISON:  None. FINDINGS: Uterus Measurements: 11.8 x 6.8 x 7.2 cm. No fibroids or other mass visualized. Endometrium Thickness: 19 mm in thickness.  No focal abnormality visualized. Right ovary Measurements: 4.3 x 2.5 x 3.5 cm. Normal appearance/no adnexal mass. Left ovary Measurements: 6.2 x 3.5 x 4.1 cm. Normal appearance/no adnexal mass. Pulsed Doppler evaluation of both ovaries demonstrates normal low-resistance arterial and venous waveforms. Other findings No abnormal free fluid. IMPRESSION: Mildly thickened endometrium. Patient reportedly had recent endometrial biopsy. Recommend correlation with results. Electronically Signed   By: Charlett Nose M.D.   On: 07/10/2018 13:08   US Pelvis (transabdominal Only)  Result Date: 07/18/2018 CLINICAL DATA:  Status post endometrial ablation on 07/14/2018, pain EXAM: TRANSABDOMINAL ULTRASOUND OF PELVIS DOPPLER ULTRASOUND OF OVARIES TECHNIQUE: Transabdominal ultrasound examination of the pelvis was performed including evaluation of the uterus, ovaries, adnexal regions, and pelvic cul-de-sac. Color and duplex Doppler ultrasound was utilized to evaluate blood flow to the ovaries. COMPARISON:  CT abdomen/pelvis dated 07/13/2018 FINDINGS: Uterus Measurements: 14.2 x 7.8 x 7.7 cm. No fibroids or other mass visualized. Endometrium Irregular thickening. Poorly visualized with gas within the endometrial cavity. Right ovary Not discretely visualized due to overlying bowel gas. No adnexal mass is seen. Left ovary Left adnexal ovarian/tubal mass with gas, measuring 8.8 x 6.5 x 6.3 cm. When correlating with prior studies, this likely reflects a combination of normal ovary (poorly visualized), hemorrhage, and gas within the tube. Pulsed Doppler evaluation demonstrates color and spectral Doppler flow within the left adnexal mass. Other: No free fluid. IMPRESSION: Endometrium is poorly visualized status post  ablation, with associated gas, likely postprocedural. Left ovarian/tubal mass with gas, likely postprocedural, poorly evaluated. Associated color and spectral Doppler flow. Right ovary is not discretely visualized. Limited evaluation due to shadowing from the postprocedural gas and limitations of transabdominal evaluation. If symptoms persist, consider CT pelvis (ideally with contrast) or short-term follow-up pelvic ultrasound (ideally when patient is eligible for TV study). Electronically Signed   By: Charline Bills M.D.   On: 07/18/2018 08:50   US Pelvis Complete  Result Date: 07/10/2018 CLINICAL DATA:  Pelvic pain, bleeding EXAM: TRANSABDOMINAL AND TRANSVAGINAL ULTRASOUND OF PELVIS DOPPLER ULTRASOUND OF OVARIES TECHNIQUE: Both transabdominal and transvaginal ultrasound examinations of the pelvis were performed. Transabdominal technique was performed for global imaging of the pelvis including uterus, ovaries, adnexal regions, and pelvic cul-de-sac. It was necessary to proceed with endovaginal exam following the transabdominal exam to visualize the ovaries/adnexa. Color and duplex Doppler ultrasound was utilized to evaluate blood flow to the ovaries. COMPARISON:  None. FINDINGS: Uterus Measurements: 11.8 x 6.8 x 7.2 cm. No fibroids or other mass visualized. Endometrium Thickness: 19 mm in thickness.  No focal abnormality visualized. Right ovary Measurements: 4.3 x 2.5 x 3.5 cm. Normal appearance/no adnexal mass. Left ovary Measurements: 6.2 x 3.5 x 4.1 cm. Normal appearance/no adnexal mass. Pulsed Doppler evaluation of both  ovaries demonstrates normal low-resistance arterial and venous waveforms. Other findings No abnormal free fluid. IMPRESSION: Mildly thickened endometrium. Patient reportedly had recent endometrial biopsy. Recommend correlation with results. Electronically Signed   By: Charlett Nose M.D.   On: 07/10/2018 13:08   Ct Abdomen Pelvis W Contrast  Result Date: 08/08/2018 CLINICAL DATA:   History of endometrial ablation complicated by development abscess within the left lower abdomen/pelvis, potentially adnexal in origin potentially the sequela sigmoid diverticulitis, post CT-guided percutaneous drainage catheter placement on 07/27/2018. Patient presents today to the interventional radiology drain Clinic for drainage catheter evaluation and management. She reports no significant output from the percutaneous catheter for the past several days. She continues to flush the percutaneous drainage catheter. Patient continues on her prescribed course of antibiotics. She denies worsening abdominal or pelvic pain. No fever or chills. EXAM: CT ABDOMEN AND PELVIS WITH CONTRAST TECHNIQUE: Multidetector CT imaging of the abdomen and pelvis was performed using the standard protocol following bolus administration of intravenous contrast. CONTRAST:  ISOVUE-300 IOPAMIDOL (ISOVUE-300) INJECTION 61% COMPARISON:  CT abdomen pelvis-07/24/2018; 07/18/2018; CT-guided percutaneous drainage catheter placement-07/27/2018 FINDINGS: Lower chest: Limited visualization of the lower thorax is negative for focal airspace opacity or pleural effusion. Normal heart size.  No pericardial effusion. Hepatobiliary: Normal hepatic contour. Previously characterized approximately 2.1 cm hemangioma within the posterosuperior aspect the right lobe of the liver (image 13, series 2) is unchanged. No new discrete hepatic lesions. Normal appearance of the gallbladder given degree distention. No radiopaque gallstones. No intra or extrahepatic biliary duct dilatation. No ascites. Pancreas: Normal appearance of the pancreas Spleen: Normal appearance of the spleen Adrenals/Urinary Tract: There is symmetric enhancement of the bilateral kidneys. No definite renal stones on this postcontrast examination. No discrete renal lesions. No urine obstruction or perinephric stranding. Normal appearance of the bilateral adrenal glands. Normal appearance of  the urinary bladder given underdistention. Stomach/Bowel: Unchanged positioning of left lower abdominal/pelvic percutaneous drainage catheter placement with resolution of previously noted left lower abdominal/pelvic fluid collection. No new definable/drainable fluid collection. Moderate colonic stool burden without evidence of enteric obstruction. Normal appearance of the terminal ileum and appendix. No pneumoperitoneum, pneumatosis or portal venous gas. Vascular/Lymphatic: Normal caliber the abdominal aorta. The major branch vessels of the abdominal aorta appear widely patent on this non CTA examination. Scattered retroperitoneal and bilateral pelvic lymph nodes are numerous though individually not enlarged by size criteria with index left-sided periaortic lymph node measuring approximately 0.7 cm in diameter (image 30, series 2) and index right inguinal lymph node measuring 0.9 cm in greatest short axis diameter (image 66, series 2), likely reactive in etiology. No bulky retroperitoneal, mesenteric, pelvic or inguinal lymphadenopathy. Reproductive: Interval resolution of previously noted air within the endometrial canal. The uterus remains mildly heterogeneous. No discrete uterine or adnexal lesion. No free fluid the pelvic cul-de-sac. Other: Tiny mesenteric fat containing periumbilical hernia. Musculoskeletal: No acute or aggressive osseous abnormalities. Moderate severe DDD of L5-S1 with disc space height loss, endplate irregularity and sclerosis. A bone island is incidentally noted within the right femoral head. IMPRESSION: 1. Resolved left lower quadrant abdominal/pelvic abscess following percutaneous drainage catheter placement. 2. Moderate to large colonic stool burden without evidence of enteric obstruction. Electronically Signed   By: Simonne Come M.D.   On: 08/08/2018 14:14   Ct Abdomen Pelvis W Contrast  Result Date: 07/24/2018 CLINICAL DATA:  Lower abdominal and bilateral flank pain. EXAM: CT ABDOMEN  AND PELVIS WITH CONTRAST TECHNIQUE: Multidetector CT imaging of the abdomen and pelvis was  performed using the standard protocol following bolus administration of intravenous contrast. CONTRAST:  ISOVUE-300 IOPAMIDOL (ISOVUE-300) INJECTION 61% COMPARISON:  CT scan of July 18, 2018. FINDINGS: Lower chest: Minimal right pleural effusion is noted with adjacent subsegmental atelectasis. Hepatobiliary: No gallstones are noted. No biliary dilatation is noted. 19 mm hemangioma seen posteriorly in right hepatic lobe. Pancreas: Unremarkable. No pancreatic ductal dilatation or surrounding inflammatory changes. Spleen: Mild splenomegaly is noted. Adrenals/Urinary Tract: Adrenal glands are unremarkable. Kidneys are normal, without renal calculi, focal lesion, or hydronephrosis. Bladder is unremarkable. Stomach/Bowel: The stomach appears normal. The appendix is unremarkable. There is no evidence of bowel obstruction. Wall thickening and inflammatory changes are seen involving a portion of the sigmoid colon, most likely representing secondary inflammation from left adnexal inflammatory process. Vascular/Lymphatic: No significant vascular abnormality is noted. Left periaortic adenopathy is noted which most likely is inflammatory in etiology. Reproductive: Large amount of gas and fluid is noted in the endometrial space which may represent postoperative findings, although endometritis cannot be excluded. 8.4 x 4.6 cm irregular fluid collection is seen in the left adnexal region most consistent with pelvic abscess. As noted above, it appears to extend superiorly to the sigmoid colon, resulting in secondary inflammation. Other: No hernia is noted. Musculoskeletal: No acute or significant osseous findings. IMPRESSION: 8.4 x 4.6 cm irregular fluid collection seen in left adnexal region most consistent with pelvic abscess. It extends superiorly and appears to be resulting in secondary inflammation of adjacent sigmoid colon.  Fluid and gas are noted in the endometrial space which may represent postoperative changes, although infection cannot be excluded. These results were called by telephone at the time of interpretation on 07/24/2018 at 5:04 pm to Dr. Donell Beers, who verbally acknowledged these results. Mild splenomegaly. Minimal right pleural effusion with adjacent subsegmental atelectasis. Electronically Signed   By: Lupita Raider, M.D.   On: 07/24/2018 17:05   US Pelvic Doppler (torsion R/o Or Mass Arterial Flow)  Result Date: 07/18/2018 CLINICAL DATA:  Status post endometrial ablation on 07/14/2018, pain EXAM: TRANSABDOMINAL ULTRASOUND OF PELVIS DOPPLER ULTRASOUND OF OVARIES TECHNIQUE: Transabdominal ultrasound examination of the pelvis was performed including evaluation of the uterus, ovaries, adnexal regions, and pelvic cul-de-sac. Color and duplex Doppler ultrasound was utilized to evaluate blood flow to the ovaries. COMPARISON:  CT abdomen/pelvis dated 07/13/2018 FINDINGS: Uterus Measurements: 14.2 x 7.8 x 7.7 cm. No fibroids or other mass visualized. Endometrium Irregular thickening. Poorly visualized with gas within the endometrial cavity. Right ovary Not discretely visualized due to overlying bowel gas. No adnexal mass is seen. Left ovary Left adnexal ovarian/tubal mass with gas, measuring 8.8 x 6.5 x 6.3 cm. When correlating with prior studies, this likely reflects a combination of normal ovary (poorly visualized), hemorrhage, and gas within the tube. Pulsed Doppler evaluation demonstrates color and spectral Doppler flow within the left adnexal mass. Other: No free fluid. IMPRESSION: Endometrium is poorly visualized status post ablation, with associated gas, likely postprocedural. Left ovarian/tubal mass with gas, likely postprocedural, poorly evaluated. Associated color and spectral Doppler flow. Right ovary is not discretely visualized. Limited evaluation due to shadowing from the postprocedural gas and limitations of  transabdominal evaluation. If symptoms persist, consider CT pelvis (ideally with contrast) or short-term follow-up pelvic ultrasound (ideally when patient is eligible for TV study). Electronically Signed   By: Charline Bills M.D.   On: 07/18/2018 08:50   [place] Art/ven Flow Abd Pelv Doppler  Result Date: 07/10/2018 CLINICAL DATA:  Pelvic pain, bleeding EXAM: TRANSABDOMINAL AND TRANSVAGINAL  ULTRASOUND OF PELVIS DOPPLER ULTRASOUND OF OVARIES TECHNIQUE: Both transabdominal and transvaginal ultrasound examinations of the pelvis were performed. Transabdominal technique was performed for global imaging of the pelvis including uterus, ovaries, adnexal regions, and pelvic cul-de-sac. It was necessary to proceed with endovaginal exam following the transabdominal exam to visualize the ovaries/adnexa. Color and duplex Doppler ultrasound was utilized to evaluate blood flow to the ovaries. COMPARISON:  None. FINDINGS: Uterus Measurements: 11.8 x 6.8 x 7.2 cm. No fibroids or other mass visualized. Endometrium Thickness: 19 mm in thickness.  No focal abnormality visualized. Right ovary Measurements: 4.3 x 2.5 x 3.5 cm. Normal appearance/no adnexal mass. Left ovary Measurements: 6.2 x 3.5 x 4.1 cm. Normal appearance/no adnexal mass. Pulsed Doppler evaluation of both ovaries demonstrates normal low-resistance arterial and venous waveforms. Other findings No abnormal free fluid. IMPRESSION: Mildly thickened endometrium. Patient reportedly had recent endometrial biopsy. Recommend correlation with results. Electronically Signed   By: Charlett Nose M.D.   On: 07/10/2018 13:08   Dg Sinus/fist Tube Chk-non Gi  Result Date: 08/08/2018 CLINICAL DATA:  History of endometrial ablation complicated by development abscess within the left lower abdomen/pelvis, potentially adnexal in origin potentially the sequela sigmoid diverticulitis, post CT-guided percutaneous drainage catheter placement on 07/27/2018. Patient presents today to the  interventional radiology drain Clinic for drainage catheter evaluation and management. CT scan of the abdomen pelvis performed earlier today demonstrates complete resolution the left lower quadrant/pelvic abscess following percutaneous drainage catheter placement without evidence of a new definable/drainable fluid collection. She reports no significant output from the percutaneous catheter for the past several days. She continues to flush the percutaneous drainage catheter. Patient continues on her prescribed course of antibiotics. She denies worsening abdominal or pelvic pain. No fever or chills. EXAM: ABSCESS INJECTION COMPARISON:  CT abdomen pelvis-earlier same day; 07/24/2018; CT-guided left lower abdominal/pelvic percutaneous drainage catheter placement - 07/27/2018 CONTRAST:  10 cc Omnipaque 300, administered via the existing percutaneous drainage catheter FLUOROSCOPY TIME:  30 seconds (12 mGy) TECHNIQUE: The patient was positioned supine on the fluoroscopy table. A preprocedural spot fluoroscopic image was obtained of the lower abdomen/pelvis and the existing percutaneous drainage catheter. Multiple spot fluoroscopic and radiographic images were obtained following the injection of a small amount of contrast via the existing percutaneous drainage catheter. Images reviewed in decision was made to remove the percutaneous drainage catheter. The external portion of the drainage catheter was cut and drainage catheter was removed intact. A dressing was placed. The patient tolerated the procedure well without immediate postprocedural complication. FINDINGS: Preprocedural spot fluoroscopic image demonstrates unchanged positioning of the left lower abdomen/pelvic percutaneous drainage catheter with end coiled and locked overlying the left mid hemi pelvis. Excreted contrast is seen within the urinary bladder as well as the adjacent left ureter Contrast injection demonstrates opacification of decompressed abscess cavity  with minimal reflux of contrast along the catheter tract. There is no definitive fistulous connection to the adjacent adnexa or intestines. IMPRESSION: Resolved lower abdominal/pelvic abscess following percutaneous drainage catheter placement without evidence of fistula. Drainage catheter was removed at the patient's bedside without incident. Electronically Signed   By: Simonne Come M.D.   On: 08/08/2018 14:42   Ct Image Guided Fluid Drain By Catheter  Result Date: 07/27/2018 INDICATION: Recent history of endometrial ablation, now with fluid collection within the left lower abdomen/pelvis worrisome for abscess of uncertain etiology, potentially adnexal, potentially the sequela of sigmoid diverticulitis. Please perform CT-guided aspiration and/or drainage catheter placement for infection source control purposes. EXAM: CT IMAGE  GUIDED FLUID DRAIN BY CATHETER COMPARISON:  CT abdomen pelvis-07/24/2018; 07/18/2018 MEDICATIONS: The patient is currently admitted to the hospital and receiving intravenous antibiotics. The antibiotics were administered within an appropriate time frame prior to the initiation of the procedure. ANESTHESIA/SEDATION: Moderate (conscious) sedation was employed during this procedure. A total of Versed 3 mg and Fentanyl 200 mcg was administered intravenously. Moderate Sedation Time: 38 minutes. The patient's level of consciousness and vital signs were monitored continuously by radiology nursing throughout the procedure under my direct supervision. CONTRAST:  None COMPLICATIONS: None immediate. PROCEDURE: Informed written consent was obtained from the patient after a discussion of the risks, benefits and alternatives to treatment. The patient was placed supine on the CT gantry and a pre procedural CT was performed re-demonstrating the known abscess/fluid collection within the left lower abdomen/pelvis with dominant ill-defined component measuring approximately 6.3 x 4.7 cm (image 16, series 2).  The procedure was planned. A timeout was performed prior to the initiation of the procedure. The skin overlying the ventral aspect of the left lower lateral abdomen was prepped and draped in the usual sterile fashion. The overlying soft tissues were anesthetized with 1% lidocaine with epinephrine. Appropriate trajectory was planned with the use of a 22 gauge spinal needle. An 18 gauge trocar needle was advanced into the abscess/fluid collection and a short Amplatz super stiff wire was coiled within the collection. Appropriate positioning was confirmed with a limited CT scan. Next, the trocar needle was exchanged for a Yueh sheath catheter which was utilized to manipulate the Amplatz wire into the central aspect of the ill-defined fluid collection. Appropriate position was confirmed with CT imaging. The tract was serially dilated allowing placement of a 10 [place] all-purpose drainage catheter. Appropriate positioning was confirmed with a limited postprocedural CT scan. 60 ml of purulent fluid was aspirated. The tube was connected to a drainage bag and sutured in place. A dressing was placed. The patient tolerated the procedure well without immediate post procedural complication. IMPRESSION: Successful CT guided placement of a 10 French all purpose drain catheter into the left lower abdomen/pelvis with aspiration of 60 mL of purulent fluid. Samples were sent to the laboratory as requested by the ordering clinical team. Electronically Signed   By: Simonne Come M.D.   On: 07/27/2018 12:19   Ir Radiologist Eval & Mgmt  Result Date: 08/08/2018 Please refer to notes tab for details about interventional procedure. (Op Note)   Labs:  CBC: Recent Labs    07/25/18 0659 07/26/18 0650 07/27/18 0528 07/28/18 0712  WBC 9.6 9.6 7.9 7.8  HGB 11.0* 11.4* 11.5* 11.9*  HCT 34.6* 35.4* 36.2 37.0  PLT 480* 469* 482* 451*    COAGS: Recent Labs    07/14/18 1110 07/18/18 1236 07/27/18 0528  INR 1.52 1.57 1.19   APTT 35 41* 37*    BMP: Recent Labs    07/22/18 0633 07/24/18 0523 07/25/18 0659 07/26/18 0650  NA 137 137 134* 136  K 4.9 4.2 3.8 4.2  CL 104 103 101 105  CO2 23 24 21* 21*  GLUCOSE 150* 89 98 96  BUN 11 9 11 8   CALCIUM 8.7* 8.4* 8.2* 8.7*  CREATININE 0.89 0.92 1.03* 0.85  GFRNONAA >60 >60 >60 >60  GFRAA >60 >60 >60 >60    LIVER FUNCTION TESTS: Recent Labs    06/11/18 1726 07/10/18 0619 07/11/18 0600 07/18/18 0619  BILITOT 0.4 0.6 0.9 0.7  AST 17 16 14* 16  ALT 16 11 9 22   ALKPHOS  57 52 47 93  PROT 7.2 6.9 5.6* 6.7  ALBUMIN 3.8 3.5 2.9* 3.0*    TUMOR MARKERS: No results for input(s): AFPTM, CEA, CA199, CHROMGRNA in the last 8760 hours.  Assessment and Plan:  Kimberly Stuart is a 47 y.o. female with past medical history significant for DVT who underwent recent endometrial ablation complicated by development of indeterminate abscess within the left lower abdomen/pelvis potentially adnexal in etiology, potentially the sequela of concomitant adjacent sigmoid diverticulitis.  Patient ultimately underwent CT-guided peritoneal drainage catheter placement on 07/27/2018 for infection source control purposes.  Patient reports little to no output from the percutaneous catheter for the past several days.  She continues to flush the percutaneous drainage catheter.  Patient denies worsening abdominal or pelvic pain.  No fever or chills.  CT scan of the abdomen pelvis performed earlier today demonstrates complete resolution of the lower abdominal/pelvic abscess following intravenous catheter placement.  No new or enlarging fluid collections.  Given indeterminate nature of the abdominal/pelvic fluid collection, the decision was made to proceed with drainage catheter injection.  Fortunately, drainage catheter injection was negative for fistulous connection to either the adjacent adnexa or the intestines.  Given above, the decision was made to remove the percutaneous catheter.   This was done at the patient's bedside without incident.   PLAN: - The patient was encouraged to maintain compliance with her prescribed course of antibiotics  - She was encouraged to maintain all follow-up appointments with her gynecologist.  The patient knows to call the interventional radiology clinic with any future questions or concerns to me otherwise follow-up on a as needed basis.  Thank you for this interesting consult.  I greatly enjoyed meeting Kimberly Stuart and look forward to participating in their care.  A copy of this report was sent to the requesting provider on this date.  Electronically Signed: Simonne Come 08/08/2018, 3:32 PM   I spent a total of 15 Minutes in face to face in clinical consultation, greater than 50% of which was counseling/coordinating care for percutaneous drainage catheter evaluation and management

## 2018-08-16 ENCOUNTER — Ambulatory Visit (INDEPENDENT_AMBULATORY_CARE_PROVIDER_SITE_OTHER): Payer: Self-pay | Admitting: Obstetrics and Gynecology

## 2018-08-16 ENCOUNTER — Encounter: Payer: Self-pay | Admitting: Obstetrics and Gynecology

## 2018-08-16 VITALS — BP 115/81 | HR 81 | Wt 164.7 lb

## 2018-08-16 DIAGNOSIS — N959 Unspecified menopausal and perimenopausal disorder: Secondary | ICD-10-CM

## 2018-08-16 DIAGNOSIS — N739 Female pelvic inflammatory disease, unspecified: Secondary | ICD-10-CM

## 2018-08-17 ENCOUNTER — Encounter: Payer: Self-pay | Admitting: Obstetrics and Gynecology

## 2018-08-17 ENCOUNTER — Encounter: Payer: Self-pay | Admitting: *Deleted

## 2018-08-17 NOTE — Progress Notes (Signed)
Obstetrics and Gynecology Visit Return Patient Evaluation  Appointment Date: 08/16/2018  Primary Care Provider: [facility]: [facility]  Chief Complaint: follow up hospitalization for pelvic abscess  History of Present Illness:  Kimberly Stuart is a 47 y.o. s/p long hospitalization for AUB, anemia (9/19-9/12), post op readmission (9/17-28) after HTA endometrial ablation (9/13) and diagnosed with pelvic abscess. She underwent diagnostic laparoscopy and then IR drain placement (9/26).   Interval History: Since that time, she states she's had her IR drain out on 10/8 after negative residual seen. She also saw her PCP earlier today and will continue on the eliquis for now. She states that she doesn't have anymore AUB, she is having some hot flashes and night sweats and her belly feels good although still some occasional soreness at the former drain site and the umbilicus.   Review of Systems: as noted in the History of Present Illness.   Patient Active Problem List   Diagnosis Date Noted  . Diverticulosis 07/23/2018  . Left lower quadrant abdominopelvic abscess (HCC) 07/23/2018  . Abdominal pain 07/18/2018  . Postoperative pain 07/18/2018  . Pelvic pain   . Symptomatic anemia 07/10/2018  . History of cervical dysplasia 07/10/2018  . Abnormal uterine bleeding (AUB) 06/13/2018  . Acute deep vein thrombosis (DVT) of left lower extremity (HCC) 06/13/2018  . Obesity (BMI 30.0-34.9) 06/13/2018   Medications:  Deniece A. Winker had no medications administered during this visit. Current Outpatient Medications  Medication Sig Dispense Refill  . apixaban (ELIQUIS) 5 MG TABS tablet Take 1 tablet (5 mg total) by mouth 2 (two) times daily. 60 tablet 0  . ibuprofen (ADVIL,MOTRIN) 600 MG tablet Take 1 tablet (600 mg total) by mouth every 6 (six) hours as needed for headache, mild pain, moderate pain or cramping. 60 tablet 2  .  acetaminophen (TYLENOL) 325 MG tablet Take 2 tablets (650 mg total) by mouth every 6 (six) hours as needed for mild pain (or Fever >/= 101). (Patient not taking: Reported on 07/18/2018) 20 tablet 0  . docusate sodium (COLACE) 100 MG capsule Take 1 capsule (100 mg total) by mouth 2 (two) times daily as needed for mild constipation or moderate constipation. (Patient not taking: Reported on 08/16/2018) 30 capsule 2  . ferrous sulfate 325 (65 FE) MG tablet Take 1 tablet (325 mg total) by mouth 2 (two) times daily with a meal. (Patient not taking: Reported on 08/16/2018) 60 tablet 3  . Magnesium 250 MG TABS Take 250 mg by mouth daily.    . megestrol (MEGACE) 40 MG tablet Take 2 tablets (80 mg total) by mouth 2 (two) times daily. (Patient not taking: Reported on 08/16/2018) 30 tablet 3  . Multiple Vitamins-Minerals (MULTIVITAMIN) tablet Take 1 tablet by mouth daily. (Patient not taking: Reported on 07/05/2018)    . ondansetron (ZOFRAN) 4 MG tablet Take 1 tablet (4 mg total) by mouth every 6 (six) hours as needed for nausea. (Patient not taking: Reported on 08/16/2018) 20 tablet 0  . oxyCODONE (OXY IR/ROXICODONE) 5 MG immediate release tablet Take 1-2 tablets (5-10 mg total) by mouth every 4 (four) hours as needed for severe pain. (Patient not taking: Reported on 08/16/2018) 20 tablet 0  . polyethylene glycol (MIRALAX / GLYCOLAX) packet Take 17 g by mouth daily as needed for mild constipation.    . simethicone (GAS-X) 80 MG chewable tablet Chew 2 tablets (160 mg total) by mouth every 6 (six) hours as needed (gas  pain). (Patient not taking: Reported on 07/18/2018) 30 tablet 2   No current facility-administered medications for this visit.     Allergies: is allergic to estrogens.  Physical Exam:  BP 115/81   Pulse 81   Wt 164 lb 11.2 oz (74.7 kg)   LMP  (LMP Unknown)   BMI 30.62 kg/m  Body mass index is 30.62 kg/m. General appearance: Well nourished, well developed female in no acute distress.  Abdomen:  diffusely non tender to palpation, non distended, and no masses, hernias. Well healed l/s port sites and former IR drain site, no e/o infection.  Neuro/Psych:  Normal mood and affect.     Assessment: pt doing well  Plan: *History of pelvic abscess: I told her that it's encouraging that she is healing well. Continue to follow with exp management but I told her that she should continue to get better. Recommend Kefir half cup qhs to help with some GI discomfort she has occasional with foods that may be due to the IV abx she was on *AUB: I told her that it's great that she's still on anticoagulation and not having any AUB and sometimes it can take the ablation a few weeks to really start to work. I also told her that the HTA shouldn't have any issues with causing her to go into menopause. I told her she may actually be going into menopause or the stress from the hospitalization and the infection causing the s/s.  If going through menopause, I told her that this should fix the AUB, but only time will tell to see how her sees do. I will see her back in early 2020 for annual. If it seems like she's in menopause, I can d/w her non hormonal options for s/s, if she's interested.  *Menopausal s/s: see above.   RTC: 44m for annual exam.   Cornelia Copa MD Attending Center for [facility])

## 2018-11-07 ENCOUNTER — Ambulatory Visit (HOSPITAL_COMMUNITY)
Admission: RE | Admit: 2018-11-07 | Discharge: 2018-11-07 | Disposition: A | Discharge status: Home / Self Care | Payer: Self-pay | Source: Ambulatory Visit | Attending: Emergency Medicine | Admitting: Emergency Medicine

## 2018-11-07 ENCOUNTER — Other Ambulatory Visit: Payer: Self-pay

## 2018-11-07 ENCOUNTER — Emergency Department (HOSPITAL_COMMUNITY)
Admission: EM | Admit: 2018-11-07 | Discharge: 2018-11-07 | Disposition: A | Discharge status: Home / Self Care | Payer: Self-pay | Attending: Emergency Medicine | Admitting: Emergency Medicine

## 2018-11-07 ENCOUNTER — Encounter (HOSPITAL_COMMUNITY): Payer: Self-pay | Admitting: *Deleted

## 2018-11-07 DIAGNOSIS — M79604 Pain in right leg: Secondary | ICD-10-CM | POA: Insufficient documentation

## 2018-11-07 DIAGNOSIS — M7989 Other specified soft tissue disorders: Secondary | ICD-10-CM | POA: Insufficient documentation

## 2018-11-07 DIAGNOSIS — Z79899 Other long term (current) drug therapy: Secondary | ICD-10-CM | POA: Insufficient documentation

## 2018-11-07 DIAGNOSIS — Z7901 Long term (current) use of anticoagulants: Secondary | ICD-10-CM | POA: Insufficient documentation

## 2018-11-07 HISTORY — DX: Encounter for other specified aftercare: Z51.89

## 2018-11-07 LAB — I-STAT CHEM 8, ED
BUN: 16 mg/dL (ref 6–20)
CALCIUM ION: 1.23 mmol/L (ref 1.15–1.40)
CHLORIDE: 105 mmol/L (ref 98–111)
Creatinine, Ser: 1 mg/dL (ref 0.44–1.00)
GLUCOSE: 99 mg/dL (ref 70–99)
HCT: 39 % (ref 36.0–46.0)
Hemoglobin: 13.3 g/dL (ref 12.0–15.0)
Potassium: 3.9 mmol/L (ref 3.5–5.1)
Sodium: 141 mmol/L (ref 135–145)
TCO2: 26 mmol/L (ref 22–32)

## 2018-11-07 LAB — POC OCCULT BLOOD, ED: Fecal Occult Bld: NEGATIVE

## 2018-11-07 NOTE — ED Provider Notes (Signed)
[facility] Provider Note   CSN: [id number] Arrival date & time: 11/07/18  0434     History   Chief Complaint Chief Complaint  Patient presents with  . Leg Pain    HPI Kimberly Stuart is a 48 y.o. female.  Patient presents the emergency department with a chief complaint of right calf swelling.  She states the symptoms started about 5 days ago.  She reports pain in the back of her calf as well.  She denies any redness.  Denies any injury.  She does report a history of DVT in the contralateral leg.  She is anticoagulated on Eliquis.  She has been compliant with taking her medication.  She denies any chest pain or shortness of breath.  Denies any fevers chills.  [place], but otherwise denies any long travel.  Additionally, patient states that she has had some very dark stools, which are unusual for her.  Denies any abdominal pain.  The history is provided by the patient. No language interpreter was used.    Past Medical History:  Diagnosis Date  . Anemia   . Blood transfusion without reported diagnosis   . Left lower quadrant abdominopelvic abscess (HCC) 07/23/2018  . Symptomatic anemia 07/10/2018  . Vitamin D deficiency   . VTE (venous thromboembolism) 2019   left calf. after OCP used for AUB    Patient Active Problem List   Diagnosis Date Noted  . Diverticulosis 07/23/2018  . Abdominal pain 07/18/2018  . History of cervical dysplasia 07/10/2018  . Abnormal uterine bleeding (AUB) 06/13/2018  . Acute deep vein thrombosis (DVT) of left lower extremity (HCC) 06/13/2018  . Obesity (BMI 30.0-34.9) 06/13/2018    Past Surgical History:  Procedure Laterality Date  . DILITATION & CURRETTAGE/HYSTROSCOPY WITH NOVASURE ABLATION N/A 07/14/2018   Procedure: Attempted Novasure Ablation;  Surgeon: Tereso NewcomerAnyanwu, Ugonna A, MD;  Location: [facility];  Service: Gynecology;  Laterality: N/A;  . HYSTEROSCOPY N/A 07/14/2018   Procedure:  HYSTEROSCOPY WITH HYDROTHERMAL ABLATION;  Surgeon: Tereso NewcomerAnyanwu, Ugonna A, MD;  Location: [facility];  Service: Gynecology;  Laterality: N/A;  . IR RADIOLOGIST EVAL & MGMT  08/08/2018  . LAPAROSCOPY N/A 07/21/2018   Procedure: LAPAROSCOPY DIAGNOSTIC;  Surgeon: Adam PhenixArnold, James G, MD;  Location: [facility];  Service: Gynecology;  Laterality: N/A;  . WISDOM TOOTH EXTRACTION       OB History    Gravida  4   Para  3   Term  3   Preterm      AB  1   Living  3     SAB  1   TAB      Ectopic      Multiple      Live Births  3            Home Medications    Prior to Admission medications   Medication Sig Start Date End Date Taking? Authorizing Provider  acetaminophen (TYLENOL) 325 MG tablet Take 2 tablets (650 mg total) by mouth every 6 (six) hours as needed for mild pain (or Fever >/= 101). Patient not taking: Reported on 07/18/2018 07/14/18   Seawell, Richmond CampbellJaimie A, DO  apixaban (ELIQUIS) 5 MG TABS tablet Take 1 tablet (5 mg total) by mouth 2 (two) times daily. 07/13/18   Lanelle BalHarbrecht, Lawrence, MD  docusate sodium (COLACE) 100 MG capsule Take 1 capsule (100 mg total) by mouth 2 (two) times daily as needed for mild constipation or moderate constipation.  Patient not taking: Reported on 08/16/2018 07/14/18   Tereso NewcomerAnyanwu, Ugonna A, MD  ferrous sulfate 325 (65 FE) MG tablet Take 1 tablet (325 mg total) by mouth 2 (two) times daily with a meal. Patient not taking: Reported on 08/16/2018 07/14/18   Anyanwu, Jethro BastosUgonna A, MD  ibuprofen (ADVIL,MOTRIN) 600 MG tablet Take 1 tablet (600 mg total) by mouth every 6 (six) hours as needed for headache, mild pain, moderate pain or cramping. 07/14/18   Anyanwu, Jethro BastosUgonna A, MD  Magnesium 250 MG TABS Take 250 mg by mouth daily.    [provider]  megestrol (MEGACE) 40 MG tablet Take 2 tablets (80 mg total) by mouth 2 (two) times daily. Patient not taking: Reported on 08/16/2018 07/14/18   Tereso NewcomerAnyanwu, Ugonna A, MD  Multiple Vitamins-Minerals (MULTIVITAMIN) tablet Take 1 tablet  by mouth daily. Patient not taking: Reported on 07/05/2018 05/29/18   Calvert CantorWeinhold, Samantha C, CNM  ondansetron (ZOFRAN) 4 MG tablet Take 1 tablet (4 mg total) by mouth every 6 (six) hours as needed for nausea. Patient not taking: Reported on 08/16/2018 07/13/18   Lanelle BalHarbrecht, Lawrence, MD  oxyCODONE (OXY IR/ROXICODONE) 5 MG immediate release tablet Take 1-2 tablets (5-10 mg total) by mouth every 4 (four) hours as needed for severe pain. Patient not taking: Reported on 08/16/2018 07/29/18   Reva BoresPratt, Tanya S, MD  polyethylene glycol [facility](MIRALAX / Ethelene HalGLYCOLAX) packet Take 17 g by mouth daily as needed for mild constipation.    [provider]  simethicone (GAS-X) 80 MG chewable tablet Chew 2 tablets (160 mg total) by mouth every 6 (six) hours as needed (gas pain). Patient not taking: Reported on 07/18/2018 07/14/18   Tereso NewcomerAnyanwu, Ugonna A, MD    Family History No family history on file.  Social History Social History   Tobacco Use  . Smoking status: Never Smoker  . Smokeless tobacco: Never Used  Substance Use Topics  . Alcohol use: No  . Drug use: No     Allergies   Estrogens   Review of Systems Review of Systems  All other systems reviewed and are negative.    Physical Exam Updated Vital Signs BP 118/79 (BP Location: Right Arm)   Pulse 81   Resp 14   Ht 5\' 1"  (1.549 m)   Wt 78 kg   SpO2 100%   BMI 32.50 kg/m   Physical Exam Vitals signs and nursing note reviewed.  Constitutional:      General: She is not in acute distress.    Appearance: She is not diaphoretic.  HENT:     Head: Normocephalic and atraumatic.  Eyes:     Conjunctiva/sclera: Conjunctivae normal.     Pupils: Pupils are equal, round, and reactive to light.  Neck:     Trachea: No tracheal deviation.  Cardiovascular:     Rate and Rhythm: Normal rate.  Pulmonary:     Effort: Pulmonary effort is normal. No respiratory distress.  Abdominal:     Palpations: Abdomen is soft.  Genitourinary:    Comments: Chaperone  present for digital rectal exam, palpable internal hemorrhoid, soft dark green stool present Musculoskeletal: Normal range of motion.  Skin:    General: Skin is warm and dry.  Neurological:     Mental Status: She is alert and oriented to person, place, and time.  Psychiatric:        Judgment: Judgment normal.      ED Treatments / Results  Labs (all labs ordered are listed, but only abnormal results are displayed)  Labs Reviewed  I-STAT CHEM 8, ED  POC OCCULT BLOOD, ED    EKG None  Radiology No results found.  Procedures Procedures (including critical care time)  Medications Ordered in ED Medications - No data to display   Initial Impression / Assessment and Plan / ED Course  I have reviewed the triage vital signs and the nursing notes.  Pertinent labs & imaging results that were available during my care of the patient were reviewed by me and considered in my medical decision making (see chart for details).     Patient with right calf swelling.  Patient is very concerned about DVT.  She has a history of DVT in her contralateral leg.  However, she is appropriately anticoagulated on Eliquis.  She does have some popliteal tenderness, she could have a Baker's cyst, which showed causing her symptoms.  In any case, I do believe that she would benefit from having an ultrasound of her right lower extremity performed this morning.  I discussed that she can either wait here or return.  Patient elects to return.  Regarding the dark stools, her Hemoccult test is negative.  H&H is 13.3  Final Clinical Impressions(s) / ED Diagnoses   Final diagnoses:  Right leg pain    ED Discharge Orders    None       Roxy Horseman, PA-C 11/07/18 0548    Ward, Layla Maw, DO 11/07/18 [phone]

## 2018-11-07 NOTE — Progress Notes (Signed)
Right lower extremity venous duplex exam completed. Please see preliminary notes on CV PROC under chart review. Carlisha Wisler H Jasyn Mey(RDMS RVT) 11/07/18 8:36 AM

## 2018-11-07 NOTE — ED Triage Notes (Signed)
C/o pain in right calf onset 5 days ago , states is very pain no swelling. Hx. Of DVT in left.

## 2019-01-11 IMAGING — CT CT ABD-PELV W/ CM
2 of 4 series · 11 of 46 positions shown, 12 images · IV contrast (iopamidol)
Comparison: CT abdomen pelvis-07/24/2018; 07/18/2018; CT-guided
percutaneous drainage catheter placement-07/27/2018

CLINICAL DATA: History of endometrial ablation complicated by
development abscess within the left lower abdomen/pelvis,
potentially adnexal in origin potentially the sequela sigmoid
diverticulitis, post CT-guided percutaneous drainage catheter
placement on 07/27/2018.
TECHNIQUE: Multidetector CT imaging of the abdomen and pelvis was performed
using the standard protocol following bolus administration of
intravenous contrast.

CONTRAST:  100mL XIAES5-633 IOPAMIDOL (XIAES5-633) INJECTION 61%

[Series 2: abd pelvis 5.00 br40 s3 ax · axial · 0.55mm/px · z∈[+1029,+1379]mm · 8 of 86 slices shown, 9 images]
[im 8/86  soft-tissue]
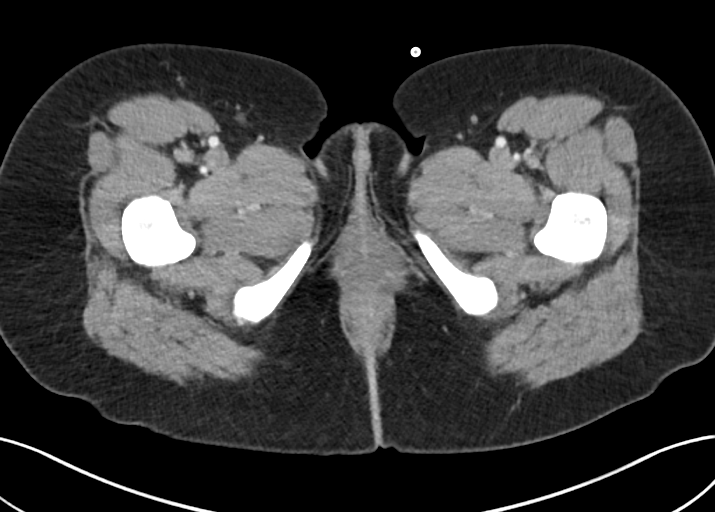
[im 8/86  bone]
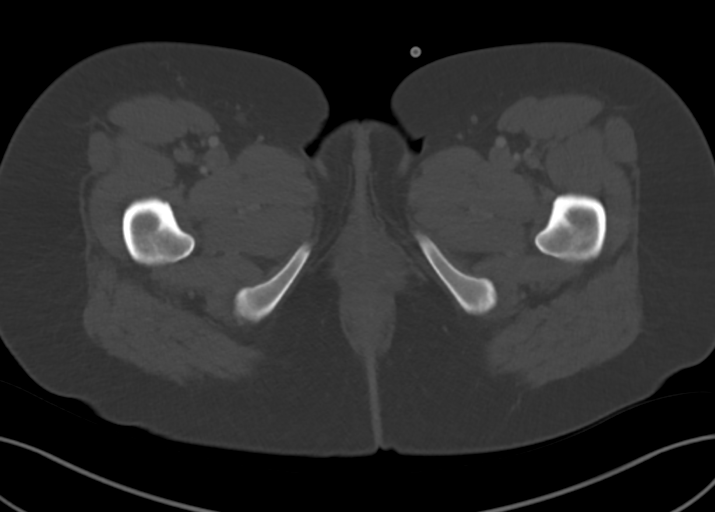
[im 18/86  soft-tissue]
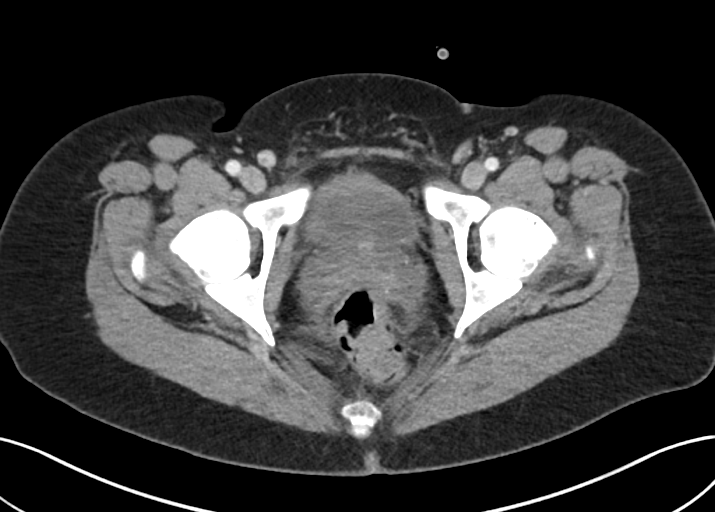
[im 29/86  soft-tissue]
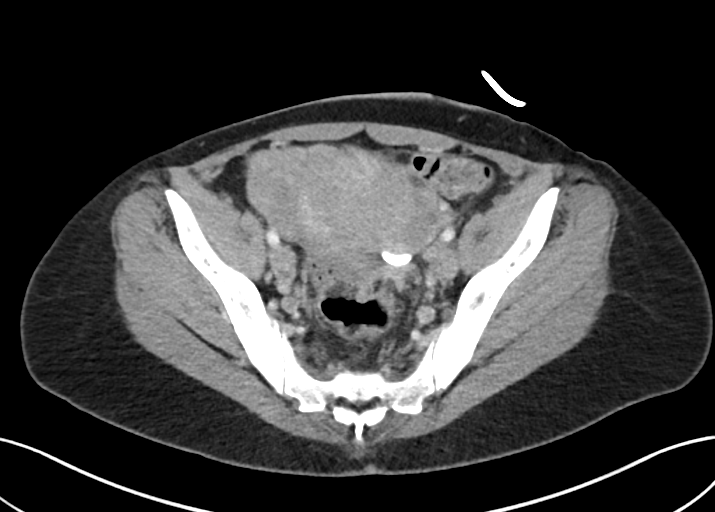
[im 39/86  soft-tissue]
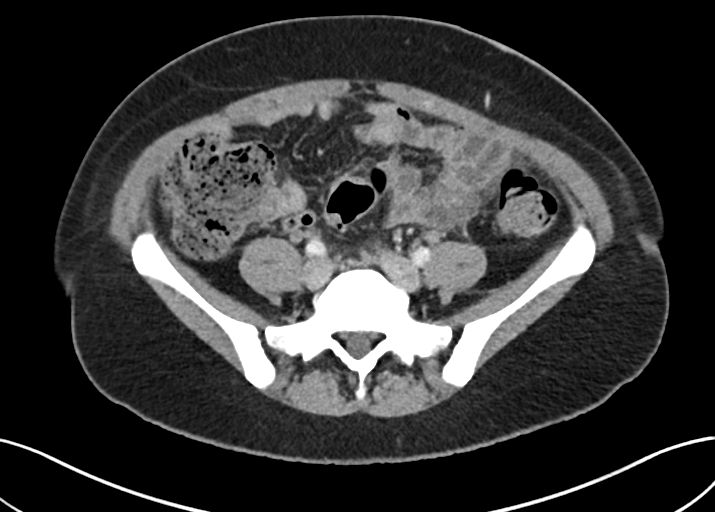
[im 47/86  soft-tissue]
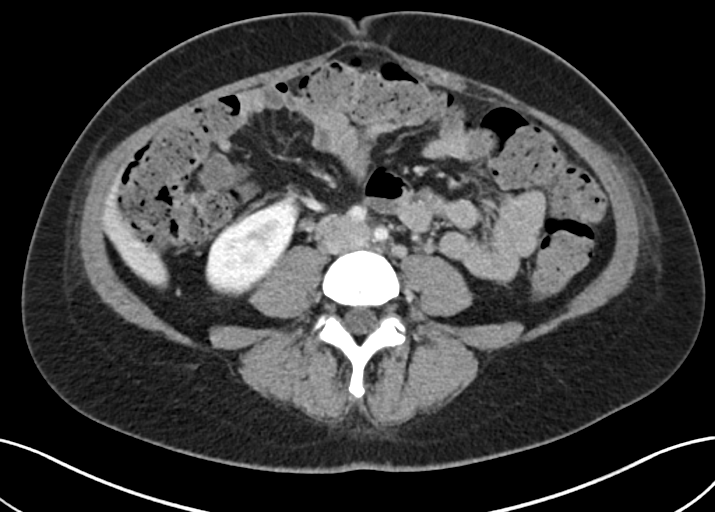
[im 57/86  soft-tissue]
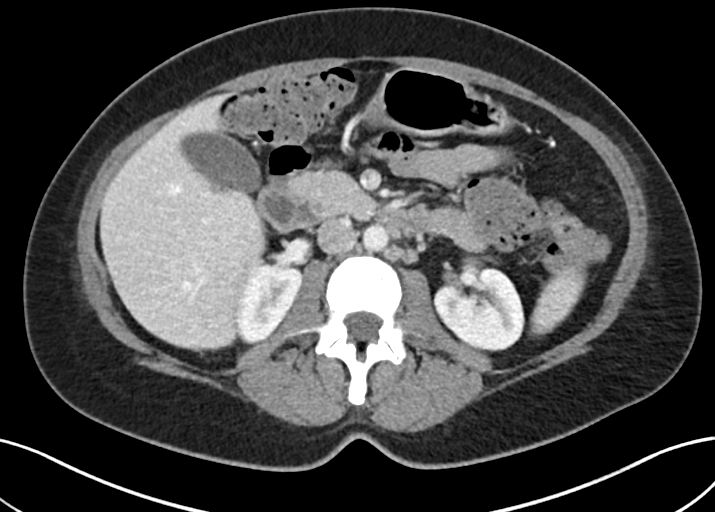
[im 68/86  soft-tissue]
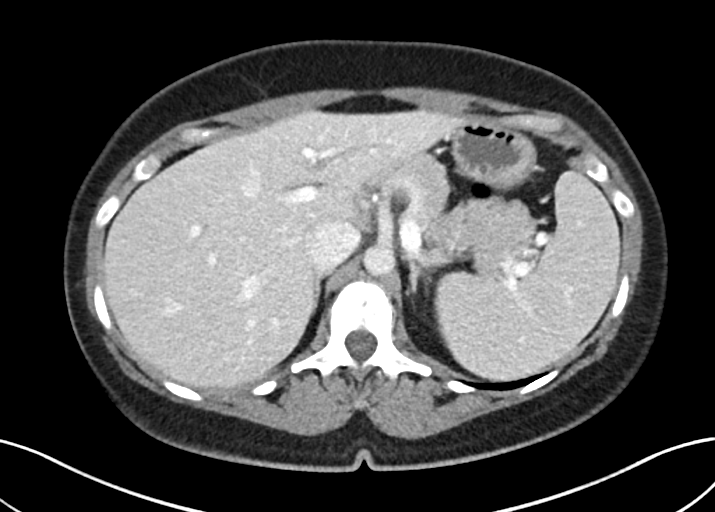
[im 78/86  soft-tissue]
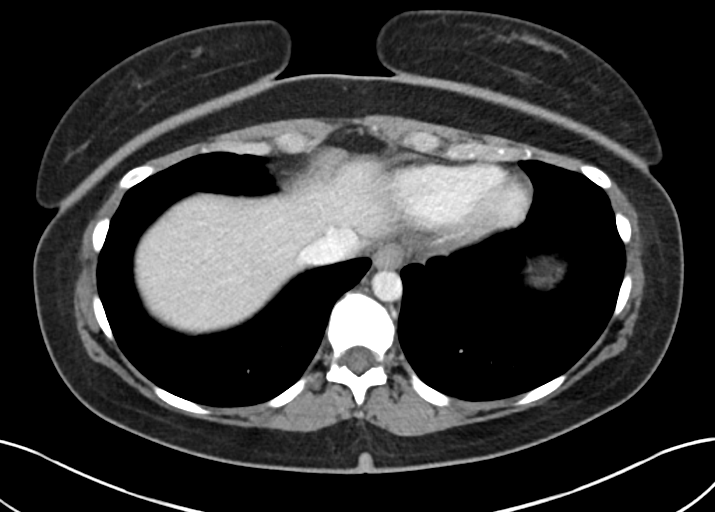

[Series 6: abd pelvis 2.00 br40 s3 cor · coronal · 0.77mm/px · 3 of 135 slices shown]
[im 45/135  soft-tissue]
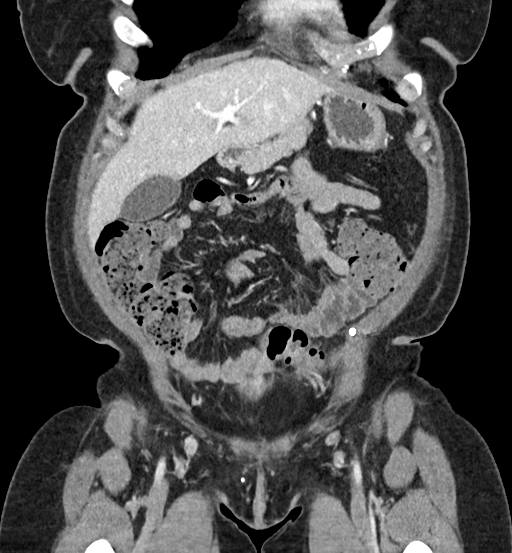
[im 60/135  soft-tissue]
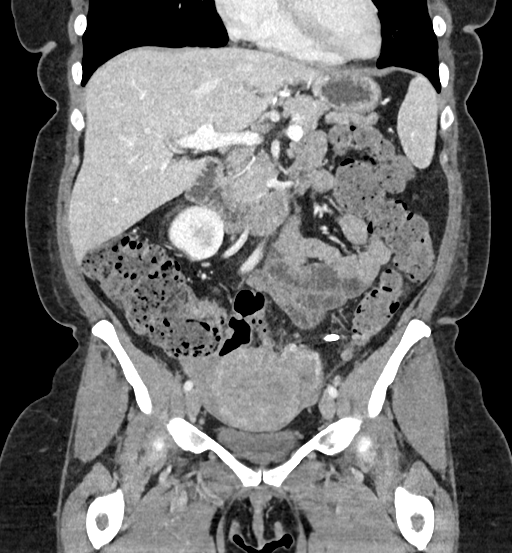
[im 75/135  soft-tissue]
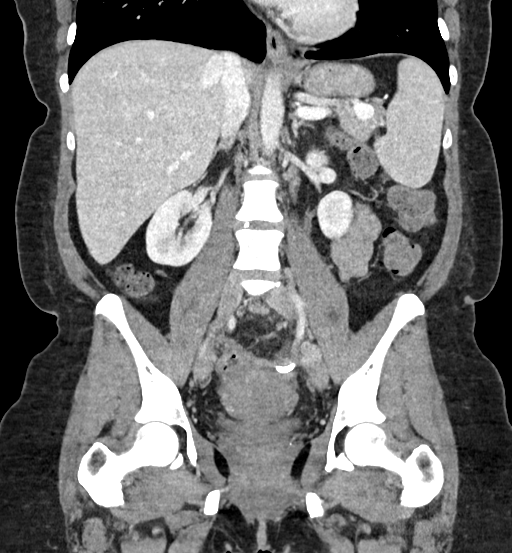

[11 of 46 positions shown; findings below may reference images not displayed]

Patient presents today to the [REDACTED]
for drainage catheter evaluation and management.

She reports no significant output from the percutaneous catheter for
the past several days. She continues to flush the percutaneous
drainage catheter. Patient continues on her prescribed course of
antibiotics. She denies worsening abdominal or pelvic pain. No fever
or chills.

EXAM:
CT ABDOMEN AND PELVIS WITH CONTRAST
FINDINGS: Lower chest: Limited visualization of the lower thorax is negative
for focal airspace opacity or pleural effusion.

Normal heart size.  No pericardial effusion.

Hepatobiliary: Normal hepatic contour. Previously characterized
approximately 2.1 cm hemangioma within the posterosuperior aspect
the right lobe of the liver (image 13, series 2) is unchanged. No
new discrete hepatic lesions. Normal appearance of the gallbladder
given degree distention. No radiopaque gallstones. No intra or
extrahepatic biliary duct dilatation. No ascites.

Pancreas: Normal appearance of the pancreas

Spleen: Normal appearance of the spleen

Adrenals/Urinary Tract: There is symmetric enhancement of the
bilateral kidneys. No definite renal stones on this postcontrast
examination. No discrete renal lesions. No urine obstruction or
perinephric stranding.

Normal appearance of the bilateral adrenal glands.

Normal appearance of the urinary bladder given underdistention.

Stomach/Bowel: Unchanged positioning of left lower abdominal/pelvic
percutaneous drainage catheter placement with resolution of
previously noted left lower abdominal/pelvic fluid collection. No
new definable/drainable fluid collection.

Moderate colonic stool burden without evidence of enteric
obstruction. Normal appearance of the terminal ileum and appendix.
No pneumoperitoneum, pneumatosis or portal venous gas.

Vascular/Lymphatic: Normal caliber the abdominal aorta. The major
branch vessels of the abdominal aorta appear widely patent on this
non CTA examination.

Scattered retroperitoneal and bilateral pelvic lymph nodes are
numerous though individually not enlarged by size criteria with
index left-sided periaortic lymph node measuring approximately
cm in diameter (image 30, series 2) and index right inguinal lymph
node measuring 0.9 cm in greatest short axis diameter (image 66,
series 2), likely reactive in etiology. No bulky retroperitoneal,
mesenteric, pelvic or inguinal lymphadenopathy.

Reproductive: Interval resolution of previously noted air within the
endometrial canal. The uterus remains mildly heterogeneous. No
discrete uterine or adnexal lesion. No free fluid the pelvic
cul-de-sac.

Other: Tiny mesenteric fat containing periumbilical hernia.

Musculoskeletal: No acute or aggressive osseous abnormalities.
Moderate severe DDD of L5-S1 with disc space height loss, endplate
irregularity and sclerosis. A bone island is incidentally noted
within the right femoral head.
IMPRESSION: 1. Resolved left lower quadrant abdominal/pelvic abscess following
percutaneous drainage catheter placement.
2. Moderate to large colonic stool burden without evidence of
enteric obstruction.

## 2019-10-30 ENCOUNTER — Ambulatory Visit: Payer: Medicaid Other | Attending: Internal Medicine

## 2019-10-30 DIAGNOSIS — R238 Other skin changes: Secondary | ICD-10-CM | POA: Diagnosis not present

## 2019-10-30 DIAGNOSIS — U071 COVID-19: Secondary | ICD-10-CM

## 2019-10-31 LAB — NOVEL CORONAVIRUS, NAA: SARS-CoV-2, NAA: NOT DETECTED

## 2019-12-31 ENCOUNTER — Other Ambulatory Visit: Payer: Self-pay

## 2019-12-31 ENCOUNTER — Emergency Department (HOSPITAL_COMMUNITY)
Admission: EM | Admit: 2019-12-31 | Discharge: 2019-12-31 | Disposition: A | Discharge status: Home / Self Care | Payer: Self-pay | Attending: Emergency Medicine | Admitting: Emergency Medicine

## 2019-12-31 ENCOUNTER — Emergency Department (HOSPITAL_COMMUNITY): Payer: Self-pay

## 2019-12-31 DIAGNOSIS — G510 Bell's palsy: Secondary | ICD-10-CM | POA: Insufficient documentation

## 2019-12-31 DIAGNOSIS — Z91040 Latex allergy status: Secondary | ICD-10-CM | POA: Insufficient documentation

## 2019-12-31 MED ORDER — PREDNISONE 20 MG PO TABS
60.0000 mg | ORAL_TABLET | Freq: Once | ORAL | Status: AC
  Administered 2019-12-31: 60 mg via ORAL
  Filled 2019-12-31: qty 3

## 2019-12-31 MED ORDER — VALACYCLOVIR HCL 500 MG PO TABS
1000.0000 mg | ORAL_TABLET | Freq: Three times a day (TID) | ORAL | Status: DC
  Administered 2019-12-31: 1000 mg via ORAL
  Filled 2019-12-31: qty 2

## 2019-12-31 MED ORDER — PREDNISONE 20 MG PO TABS: 60.0000 mg | ORAL_TABLET | Freq: Every day | ORAL | 0 refills | Status: AC

## 2019-12-31 MED ORDER — VALACYCLOVIR HCL 1 G PO TABS: 1000.0000 mg | ORAL_TABLET | Freq: Three times a day (TID) | ORAL | 0 refills | Status: AC

## 2019-12-31 NOTE — Discharge Instructions (Signed)
You were seen in the emergency department today with left face weakness.  This is weakness does not appear to be from a stroke.  This is called Bell's palsy.  I have attached some information for you to review here.  In addition to taking the steroid and antiviral medications he will need to perform regular eye care.  Please use moisturizing drops while awake and tape your left eye closed at night to prevent drying.  Call your primary care doctor for reevaluation to make sure your symptoms are improving.  If your weakness is worsening you need to follow-up with a neurologist.  If you develop weakness/numbness in your arms, legs, face, trouble talking, or difficulty swallowing you should return to the emergency department immediately.

## 2019-12-31 NOTE — ED Provider Notes (Signed)
Emergency Department Provider Note   I have reviewed the triage vital signs and the nursing notes.   HISTORY  Chief Complaint Facial Droop   HPI Kimberly Stuart is a 49 y.o. female with PMH reviewed below presents to the emergency department for evaluation of acute onset left facial droop with inability to close her left eye.  Patient states that symptoms began this morning with some dull pain behind the left ear radiating to the neck.  No change in hearing.  No rashes.  She went about her day and after removing her mask noticed that the left side of her face would not move.  She is not experiencing symptoms in her arms or legs.  No speech difficulty, difficulty swallowing, difficulty walking.  She denies any numbness.  No changes in vision.   Past Medical History:  Diagnosis Date  . Anemia   . Blood transfusion without reported diagnosis   . Left lower quadrant abdominopelvic abscess (HCC) 07/23/2018  . Symptomatic anemia 07/10/2018  . Vitamin D deficiency   . VTE (venous thromboembolism) 2019   left calf. after OCP used for AUB    Patient Active Problem List   Diagnosis Date Noted  . Diverticulosis 07/23/2018  . Abdominal pain 07/18/2018  . History of cervical dysplasia 07/10/2018  . Abnormal uterine bleeding (AUB) 06/13/2018  . Acute deep vein thrombosis (DVT) of left lower extremity (HCC) 06/13/2018  . Obesity (BMI 30.0-34.9) 06/13/2018    Past Surgical History:  Procedure Laterality Date  . DILITATION & CURRETTAGE/HYSTROSCOPY WITH NOVASURE ABLATION N/A 07/14/2018   Procedure: Attempted Novasure Ablation;  Surgeon: Tereso Newcomer, MD;  Location: [facility];  Service: Gynecology;  Laterality: N/A;  . HYSTEROSCOPY N/A 07/14/2018   Procedure: HYSTEROSCOPY WITH HYDROTHERMAL ABLATION;  Surgeon: Tereso Newcomer, MD;  Location: [facility];  Service: Gynecology;  Laterality: N/A;  . IR RADIOLOGIST EVAL & MGMT  08/08/2018  . LAPAROSCOPY N/A 07/21/2018   Procedure: LAPAROSCOPY  DIAGNOSTIC;  Surgeon: Adam Phenix, MD;  Location: [facility];  Service: Gynecology;  Laterality: N/A;  . WISDOM TOOTH EXTRACTION      Allergies Estrogens and Latex  No family history on file.  Social History Social History   Tobacco Use  . Smoking status: Never Smoker  . Smokeless tobacco: Never Used  Substance Use Topics  . Alcohol use: No  . Drug use: No    Review of Systems  Constitutional: No fever/chills Eyes: No visual changes. Inability to close left eye.  ENT: No sore throat. Cardiovascular: Denies chest pain. Respiratory: Denies shortness of breath. Gastrointestinal: No abdominal pain.  No nausea, no vomiting.  No diarrhea.  No constipation. Genitourinary: Negative for dysuria. Musculoskeletal: Negative for back pain. Skin: Negative for rash. Neurological: Negative for headaches. Left face weakness.   10-point ROS otherwise negative.  ____________________________________________   PHYSICAL EXAM:  VITAL SIGNS: ED Triage Vitals  Enc Vitals Group     BP 12/31/19 1838 (!) 152/94     Pulse Rate 12/31/19 1838 79     Resp 12/31/19 1838 20     Temp 12/31/19 1838 98.7 F (37.1 C)     Temp Source 12/31/19 1838 Oral     SpO2 12/31/19 1838 99 %     Weight 12/31/19 1843 200 lb (90.7 kg)     Height 12/31/19 1843 5\' 1"  (1.549 m)   Constitutional: Alert and oriented. Well appearing and in no acute distress. Eyes: Conjunctivae are normal. PERRL. EOMI. Cannot fully close left  eye.  Head: Atraumatic. Ears: Normal exam of the ears bilaterally without middle ear effusion or evidence of external canal inflammation/discharge. Nose: No congestion/rhinnorhea. Mouth/Throat: Mucous membranes are moist. Neck: No stridor.  Cardiovascular: Normal rate, regular rhythm. Good peripheral circulation. Grossly normal heart sounds.   Respiratory: Normal respiratory effort.  No retractions. Lungs CTAB. Gastrointestinal: No distention.  Musculoskeletal: No gross deformities of  extremities. Neurologic:  Normal speech and language.  Isolated left 7th nerve palsy with weakness of the left forehead included.  Patient with normal sensation of the face.  Normal strength and sensation in the upper and lower extremities.  Skin:  Skin is warm, dry and intact. No rash noted.  ____________________________________________  RADIOLOGY  CT Head Wo Contrast  Result Date: 12/31/2019 CLINICAL DATA:  Pain behind the left ear EXAM: CT HEAD WITHOUT CONTRAST TECHNIQUE: Contiguous axial images were obtained from the base of the skull through the vertex without intravenous contrast. COMPARISON:  CT brain 07/13/2018 FINDINGS: Brain: No evidence of acute infarction, hemorrhage, hydrocephalus, extra-axial collection or mass lesion/mass effect. Vascular: No hyperdense vessel or unexpected calcification. Skull: Normal. Negative for fracture or focal lesion. Sinuses/Orbits: No acute finding. Other: None IMPRESSION: Negative non contrasted CT appearance of the brain. Electronically Signed   By: Donavan Foil M.D.   On: 12/31/2019 20:43    ____________________________________________   PROCEDURES  Procedure(s) performed:   Procedures  None  ____________________________________________   INITIAL IMPRESSION / ASSESSMENT AND PLAN / ED COURSE  Pertinent labs & imaging results that were available during my care of the patient were reviewed by me and considered in my medical decision making (see chart for details).   Patient presents to the emergency department with 7th nerve palsy.  She does have some very minimal strength preserved in the left forehead.  There is definite weakness on this side compared to the right.  Discussed this with Dr. Leonel Ramsay.  Could consider CT head to rule out pontine type hemorrhage but doubt this clinically.  Patient does not need MRI or further work-up.  Will treat as Bell's palsy.   CT negative. Plan for steroid and antiviral meds. Neuro f/u if symptoms not  improving. Discussed eye care day/night and need for close PCP follow up to monitor that symptoms are resolving.  ____________________________________________  FINAL CLINICAL IMPRESSION(S) / ED DIAGNOSES  Final diagnoses:  Bell's palsy     MEDICATIONS GIVEN DURING THIS VISIT:  Medications  valACYclovir (VALTREX) tablet 1,000 mg (1,000 mg Oral Given 12/31/19 2110)  predniSONE (DELTASONE) tablet 60 mg (60 mg Oral Given 12/31/19 2111)     NEW OUTPATIENT MEDICATIONS STARTED DURING THIS VISIT:  Discharge Medication List as of 12/31/2019  8:58 PM    START taking these medications   Details  predniSONE (DELTASONE) 20 MG tablet Take 3 tablets (60 mg total) by mouth daily for 6 days., Starting Tue 01/01/2020, Until Mon 01/07/2020, Print    valACYclovir (VALTREX) 1000 MG tablet Take 1 tablet (1,000 mg total) by mouth 3 (three) times daily for 7 days., Starting Tue 01/01/2020, Until Tue 01/08/2020, Print        Note:  This document was prepared using Dragon voice recognition software and may include unintentional dictation errors.  Nanda Quinton, MD, [facility] Emergency Medicine    Exie Chrismer, [facility], MD 12/31/19 2312

## 2019-12-31 NOTE — ED Triage Notes (Signed)
49 yo female from home woke up this morning with pain behind left ear. Pt then  noted difficulty closing left eye with droop to left side of mouth. A/Ox3. Speech clear. Denies recent illness or fevers/chills.

## 2020-05-01 DIAGNOSIS — Z419 Encounter for procedure for purposes other than remedying health state, unspecified: Secondary | ICD-10-CM | POA: Diagnosis not present

## 2020-06-01 DIAGNOSIS — Z419 Encounter for procedure for purposes other than remedying health state, unspecified: Secondary | ICD-10-CM | POA: Diagnosis not present

## 2020-07-02 DIAGNOSIS — Z419 Encounter for procedure for purposes other than remedying health state, unspecified: Secondary | ICD-10-CM | POA: Diagnosis not present

## 2020-08-01 DIAGNOSIS — Z419 Encounter for procedure for purposes other than remedying health state, unspecified: Secondary | ICD-10-CM | POA: Diagnosis not present

## 2020-09-01 DIAGNOSIS — Z419 Encounter for procedure for purposes other than remedying health state, unspecified: Secondary | ICD-10-CM | POA: Diagnosis not present

## 2020-10-01 DIAGNOSIS — Z419 Encounter for procedure for purposes other than remedying health state, unspecified: Secondary | ICD-10-CM | POA: Diagnosis not present

## 2020-11-01 DIAGNOSIS — Z419 Encounter for procedure for purposes other than remedying health state, unspecified: Secondary | ICD-10-CM | POA: Diagnosis not present

## 2020-12-02 DIAGNOSIS — Z419 Encounter for procedure for purposes other than remedying health state, unspecified: Secondary | ICD-10-CM | POA: Diagnosis not present

## 2020-12-30 DIAGNOSIS — Z419 Encounter for procedure for purposes other than remedying health state, unspecified: Secondary | ICD-10-CM | POA: Diagnosis not present

## 2021-01-30 DIAGNOSIS — Z419 Encounter for procedure for purposes other than remedying health state, unspecified: Secondary | ICD-10-CM | POA: Diagnosis not present

## 2021-03-01 DIAGNOSIS — Z419 Encounter for procedure for purposes other than remedying health state, unspecified: Secondary | ICD-10-CM | POA: Diagnosis not present

## 2021-04-01 DIAGNOSIS — Z419 Encounter for procedure for purposes other than remedying health state, unspecified: Secondary | ICD-10-CM | POA: Diagnosis not present

## 2021-05-01 DIAGNOSIS — Z419 Encounter for procedure for purposes other than remedying health state, unspecified: Secondary | ICD-10-CM | POA: Diagnosis not present

## 2021-06-05 DIAGNOSIS — Z23 Encounter for immunization: Secondary | ICD-10-CM | POA: Diagnosis not present

## 2022-04-23 ENCOUNTER — Encounter (HOSPITAL_BASED_OUTPATIENT_CLINIC_OR_DEPARTMENT_OTHER): Payer: Self-pay | Admitting: Plastic Surgery

## 2022-04-23 ENCOUNTER — Other Ambulatory Visit: Payer: Self-pay

## 2022-04-30 ENCOUNTER — Ambulatory Visit (HOSPITAL_BASED_OUTPATIENT_CLINIC_OR_DEPARTMENT_OTHER)
Admission: RE | Admit: 2022-04-30 | Discharge: 2022-04-30 | Disposition: A | Discharge status: Home / Self Care | Payer: Medicaid Other | Source: Ambulatory Visit | Attending: Plastic Surgery | Admitting: Plastic Surgery

## 2022-04-30 ENCOUNTER — Encounter (HOSPITAL_BASED_OUTPATIENT_CLINIC_OR_DEPARTMENT_OTHER): Payer: Self-pay | Admitting: Plastic Surgery

## 2022-04-30 ENCOUNTER — Other Ambulatory Visit: Payer: Self-pay

## 2022-04-30 ENCOUNTER — Ambulatory Visit (HOSPITAL_BASED_OUTPATIENT_CLINIC_OR_DEPARTMENT_OTHER): Payer: Medicaid Other | Admitting: Anesthesiology

## 2022-04-30 ENCOUNTER — Encounter (HOSPITAL_BASED_OUTPATIENT_CLINIC_OR_DEPARTMENT_OTHER)
Admission: RE | Disposition: A | Discharge status: Home / Self Care | Payer: Self-pay | Source: Ambulatory Visit | Attending: Plastic Surgery

## 2022-04-30 DIAGNOSIS — G8929 Other chronic pain: Secondary | ICD-10-CM

## 2022-04-30 DIAGNOSIS — L304 Erythema intertrigo: Secondary | ICD-10-CM | POA: Diagnosis not present

## 2022-04-30 DIAGNOSIS — Z86718 Personal history of other venous thrombosis and embolism: Secondary | ICD-10-CM | POA: Insufficient documentation

## 2022-04-30 DIAGNOSIS — N62 Hypertrophy of breast: Secondary | ICD-10-CM | POA: Insufficient documentation

## 2022-04-30 DIAGNOSIS — M542 Cervicalgia: Secondary | ICD-10-CM

## 2022-04-30 DIAGNOSIS — M549 Dorsalgia, unspecified: Secondary | ICD-10-CM | POA: Diagnosis not present

## 2022-04-30 HISTORY — PX: BREAST REDUCTION SURGERY: SHX8

## 2022-04-30 SURGERY — MAMMOPLASTY, REDUCTION
Anesthesia: General | Site: Breast | Laterality: Bilateral

## 2022-04-30 MED ORDER — CELECOXIB 200 MG PO CAPS
200.0000 mg | ORAL_CAPSULE | ORAL | Status: AC
  Administered 2022-04-30: 200 mg via ORAL

## 2022-04-30 MED ORDER — HEPARIN SODIUM (PORCINE) 5000 UNIT/ML IJ SOLN
5000.0000 [IU] | INTRAMUSCULAR | Status: AC
Start: 2022-04-30 — End: 2022-04-30
  Administered 2022-04-30: 5000 [IU] via SUBCUTANEOUS

## 2022-04-30 MED ORDER — BUPIVACAINE HCL (PF) 0.5 % IJ SOLN
INTRAMUSCULAR | Status: AC
  Filled 2022-04-30: qty 60

## 2022-04-30 MED ORDER — ACETAMINOPHEN 500 MG PO TABS
ORAL_TABLET | ORAL | Status: AC
  Filled 2022-04-30: qty 2

## 2022-04-30 MED ORDER — 0.9 % SODIUM CHLORIDE (POUR BTL) OPTIME
TOPICAL | Status: DC | PRN
  Administered 2022-04-30: 200 mL

## 2022-04-30 MED ORDER — CEFAZOLIN SODIUM-DEXTROSE 2-4 GM/100ML-% IV SOLN
INTRAVENOUS | Status: AC
  Filled 2022-04-30: qty 100

## 2022-04-30 MED ORDER — AMISULPRIDE (ANTIEMETIC) 5 MG/2ML IV SOLN: 10.0000 mg | Freq: Once | INTRAVENOUS | Status: DC

## 2022-04-30 MED ORDER — PHENYLEPHRINE 80 MCG/ML (10ML) SYRINGE FOR IV PUSH (FOR BLOOD PRESSURE SUPPORT)
PREFILLED_SYRINGE | INTRAVENOUS | Status: AC
  Filled 2022-04-30: qty 10

## 2022-04-30 MED ORDER — CEFAZOLIN SODIUM-DEXTROSE 2-4 GM/100ML-% IV SOLN
2.0000 g | INTRAVENOUS | Status: AC
  Administered 2022-04-30: 2 g via INTRAVENOUS

## 2022-04-30 MED ORDER — CHLORHEXIDINE GLUCONATE CLOTH 2 % EX PADS: 6.0000 | MEDICATED_PAD | Freq: Once | CUTANEOUS | Status: DC

## 2022-04-30 MED ORDER — LIDOCAINE HCL (CARDIAC) PF 100 MG/5ML IV SOSY
PREFILLED_SYRINGE | INTRAVENOUS | Status: DC | PRN
  Administered 2022-04-30: 60 mg via INTRAVENOUS

## 2022-04-30 MED ORDER — GABAPENTIN 300 MG PO CAPS
300.0000 mg | ORAL_CAPSULE | ORAL | Status: AC
  Administered 2022-04-30: 300 mg via ORAL

## 2022-04-30 MED ORDER — LACTATED RINGERS IV SOLN: INTRAVENOUS | Status: DC

## 2022-04-30 MED ORDER — OXYCODONE HCL 5 MG PO TABS
5.0000 mg | ORAL_TABLET | Freq: Once | ORAL | Status: AC | PRN
  Administered 2022-04-30: 5 mg via ORAL

## 2022-04-30 MED ORDER — BUPIVACAINE HCL (PF) 0.5 % IJ SOLN
INTRAMUSCULAR | Status: DC | PRN
  Administered 2022-04-30: 30 mL

## 2022-04-30 MED ORDER — FENTANYL CITRATE (PF) 100 MCG/2ML IJ SOLN
INTRAMUSCULAR | Status: AC
  Filled 2022-04-30: qty 2

## 2022-04-30 MED ORDER — ONDANSETRON HCL 4 MG/2ML IJ SOLN
INTRAMUSCULAR | Status: DC | PRN
  Administered 2022-04-30: 4 mg via INTRAVENOUS

## 2022-04-30 MED ORDER — HYDROMORPHONE HCL 1 MG/ML IJ SOLN
INTRAMUSCULAR | Status: AC
  Filled 2022-04-30: qty 1

## 2022-04-30 MED ORDER — DEXAMETHASONE SODIUM PHOSPHATE 10 MG/ML IJ SOLN
INTRAMUSCULAR | Status: AC
  Filled 2022-04-30: qty 1

## 2022-04-30 MED ORDER — AMISULPRIDE (ANTIEMETIC) 5 MG/2ML IV SOLN
INTRAVENOUS | Status: AC
  Filled 2022-04-30: qty 4

## 2022-04-30 MED ORDER — ROCURONIUM BROMIDE 10 MG/ML (PF) SYRINGE
PREFILLED_SYRINGE | INTRAVENOUS | Status: AC
  Filled 2022-04-30: qty 10

## 2022-04-30 MED ORDER — PHENYLEPHRINE HCL (PRESSORS) 10 MG/ML IV SOLN
INTRAVENOUS | Status: DC | PRN
  Administered 2022-04-30: 80 ug via INTRAVENOUS

## 2022-04-30 MED ORDER — ACETAMINOPHEN 500 MG PO TABS
ORAL_TABLET | ORAL | Status: AC
  Filled 2022-04-30: qty 1

## 2022-04-30 MED ORDER — FENTANYL CITRATE (PF) 100 MCG/2ML IJ SOLN
25.0000 ug | INTRAMUSCULAR | Status: DC | PRN
  Administered 2022-04-30: 50 ug via INTRAVENOUS
  Administered 2022-04-30: 25 ug via INTRAVENOUS

## 2022-04-30 MED ORDER — SUGAMMADEX SODIUM 200 MG/2ML IV SOLN
INTRAVENOUS | Status: DC | PRN
  Administered 2022-04-30: 200 mg via INTRAVENOUS

## 2022-04-30 MED ORDER — HYDROMORPHONE HCL 1 MG/ML IJ SOLN
INTRAMUSCULAR | Status: DC | PRN
  Administered 2022-04-30: .5 mg via INTRAVENOUS

## 2022-04-30 MED ORDER — ONDANSETRON HCL 4 MG/2ML IJ SOLN: 4.0000 mg | Freq: Four times a day (QID) | INTRAMUSCULAR | Status: DC | PRN

## 2022-04-30 MED ORDER — PROPOFOL 10 MG/ML IV BOLUS
INTRAVENOUS | Status: DC | PRN
  Administered 2022-04-30: 200 mg via INTRAVENOUS

## 2022-04-30 MED ORDER — MIDAZOLAM HCL 5 MG/5ML IJ SOLN
INTRAMUSCULAR | Status: DC | PRN
  Administered 2022-04-30: 2 mg via INTRAVENOUS

## 2022-04-30 MED ORDER — MIDAZOLAM HCL 2 MG/2ML IJ SOLN
INTRAMUSCULAR | Status: AC
  Filled 2022-04-30: qty 2

## 2022-04-30 MED ORDER — OXYCODONE HCL 5 MG/5ML PO SOLN: 5.0000 mg | Freq: Once | ORAL | Status: AC | PRN

## 2022-04-30 MED ORDER — ROCURONIUM BROMIDE 100 MG/10ML IV SOLN
INTRAVENOUS | Status: DC | PRN
  Administered 2022-04-30: 20 mg via INTRAVENOUS
  Administered 2022-04-30: 50 mg via INTRAVENOUS

## 2022-04-30 MED ORDER — EPHEDRINE 5 MG/ML INJ
INTRAVENOUS | Status: AC
  Filled 2022-04-30: qty 5

## 2022-04-30 MED ORDER — ONDANSETRON HCL 4 MG/2ML IJ SOLN
INTRAMUSCULAR | Status: AC
  Filled 2022-04-30: qty 2

## 2022-04-30 MED ORDER — SUCCINYLCHOLINE CHLORIDE 200 MG/10ML IV SOSY
PREFILLED_SYRINGE | INTRAVENOUS | Status: AC
  Filled 2022-04-30: qty 10

## 2022-04-30 MED ORDER — DEXAMETHASONE SODIUM PHOSPHATE 4 MG/ML IJ SOLN
INTRAMUSCULAR | Status: DC | PRN
  Administered 2022-04-30: 10 mg via INTRAVENOUS

## 2022-04-30 MED ORDER — ACETAMINOPHEN 500 MG PO TABS
1000.0000 mg | ORAL_TABLET | ORAL | Status: AC
  Administered 2022-04-30: 1000 mg via ORAL

## 2022-04-30 MED ORDER — GABAPENTIN 300 MG PO CAPS
ORAL_CAPSULE | ORAL | Status: AC
  Filled 2022-04-30: qty 1

## 2022-04-30 MED ORDER — OXYCODONE HCL 5 MG PO TABS
ORAL_TABLET | ORAL | Status: AC
  Filled 2022-04-30: qty 1

## 2022-04-30 MED ORDER — HEPARIN SODIUM (PORCINE) 5000 UNIT/ML IJ SOLN
INTRAMUSCULAR | Status: AC
  Filled 2022-04-30: qty 1

## 2022-04-30 MED ORDER — FENTANYL CITRATE (PF) 100 MCG/2ML IJ SOLN
INTRAMUSCULAR | Status: DC | PRN
  Administered 2022-04-30: 100 ug via INTRAVENOUS
  Administered 2022-04-30: 50 ug via INTRAVENOUS

## 2022-04-30 MED ORDER — LIDOCAINE 2% (20 MG/ML) 5 ML SYRINGE
INTRAMUSCULAR | Status: AC
  Filled 2022-04-30: qty 5

## 2022-04-30 MED ORDER — ATROPINE SULFATE 0.4 MG/ML IV SOLN
INTRAVENOUS | Status: AC
  Filled 2022-04-30: qty 1

## 2022-04-30 MED ORDER — CELECOXIB 200 MG PO CAPS
ORAL_CAPSULE | ORAL | Status: AC
  Filled 2022-04-30: qty 1

## 2022-04-30 SURGICAL SUPPLY — 53 items
ADH SKN CLS APL DERMABOND .7 (GAUZE/BANDAGES/DRESSINGS) ×2
APL PRP STRL LF DISP 70% ISPRP (MISCELLANEOUS) ×2
BINDER BREAST 3XL (GAUZE/BANDAGES/DRESSINGS) IMPLANT
BINDER BREAST XLRG (GAUZE/BANDAGES/DRESSINGS) ×1 IMPLANT
BINDER BREAST XXLRG (GAUZE/BANDAGES/DRESSINGS) IMPLANT
BLADE SURG 10 STRL SS (BLADE) ×12 IMPLANT
BNDG GAUZE DERMACEA FLUFF (GAUZE/BANDAGES/DRESSINGS) ×2
BNDG GAUZE DERMACEA FLUFF 4 (GAUZE/BANDAGES/DRESSINGS) ×4 IMPLANT
BNDG GZE DERMACEA 4 6PLY (GAUZE/BANDAGES/DRESSINGS) ×2
CANISTER SUCT 1200ML W/VALVE (MISCELLANEOUS) ×3 IMPLANT
CHLORAPREP W/TINT 26 (MISCELLANEOUS) ×6 IMPLANT
COVER BACK TABLE 60X90IN (DRAPES) ×3 IMPLANT
COVER MAYO STAND STRL (DRAPES) ×3 IMPLANT
DERMABOND ADVANCED (GAUZE/BANDAGES/DRESSINGS) ×2
DERMABOND ADVANCED .7 DNX12 (GAUZE/BANDAGES/DRESSINGS) ×4 IMPLANT
DRAIN CHANNEL 15F RND FF W/TCR (WOUND CARE) ×2 IMPLANT
DRAPE TOP ARMCOVERS (MISCELLANEOUS) ×3 IMPLANT
DRAPE U-SHAPE 76X120 STRL (DRAPES) ×3 IMPLANT
DRAPE UTILITY XL STRL (DRAPES) ×4 IMPLANT
DRSG PAD ABDOMINAL 8X10 ST (GAUZE/BANDAGES/DRESSINGS) ×6 IMPLANT
ELECT COATED BLADE 2.86 ST (ELECTRODE) ×3 IMPLANT
ELECT REM PT RETURN 9FT ADLT (ELECTROSURGICAL) ×2
ELECTRODE REM PT RTRN 9FT ADLT (ELECTROSURGICAL) ×2 IMPLANT
EVACUATOR SILICONE 100CC (DRAIN) ×2 IMPLANT
GLOVE BIOGEL PI IND STRL 6.5 (GLOVE) IMPLANT
GLOVE BIOGEL PI IND STRL 7.5 (GLOVE) IMPLANT
GLOVE BIOGEL PI INDICATOR 6.5 (GLOVE) ×1
GLOVE BIOGEL PI INDICATOR 7.5 (GLOVE) ×1
GLOVE SURG SS PI 6.0 STRL IVOR (GLOVE) ×2 IMPLANT
GLOVE SURG SS PI 7.0 STRL IVOR (GLOVE) ×1 IMPLANT
GOWN STRL REUS W/ TWL LRG LVL3 (GOWN DISPOSABLE) ×4 IMPLANT
GOWN STRL REUS W/TWL LRG LVL3 (GOWN DISPOSABLE) ×4
NDL HYPO 25X1 1.5 SAFETY (NEEDLE) IMPLANT
NEEDLE HYPO 25X1 1.5 SAFETY (NEEDLE) ×2 IMPLANT
NS IRRIG 1000ML POUR BTL (IV SOLUTION) ×3 IMPLANT
PACK BASIN DAY SURGERY FS (CUSTOM PROCEDURE TRAY) ×3 IMPLANT
PENCIL SMOKE EVACUATOR (MISCELLANEOUS) ×3 IMPLANT
PIN SAFETY STERILE (MISCELLANEOUS) ×3 IMPLANT
SHEET MEDIUM DRAPE 40X70 STRL (DRAPES) ×6 IMPLANT
SLEEVE SCD COMPRESS KNEE MED (STOCKING) ×3 IMPLANT
SPONGE T-LAP 18X18 ~~LOC~~+RFID (SPONGE) ×10 IMPLANT
STAPLER VISISTAT 35W (STAPLE) ×3 IMPLANT
SUT ETHILON 2 0 FS 18 (SUTURE) ×3 IMPLANT
SUT MNCRL AB 4-0 PS2 18 (SUTURE) ×6 IMPLANT
SUT VIC AB 3-0 PS1 18 (SUTURE) ×12
SUT VIC AB 3-0 PS1 18XBRD (SUTURE) ×12 IMPLANT
SUT VICRYL 4-0 PS2 18IN ABS (SUTURE) ×6 IMPLANT
SYR BULB IRRIG 60ML STRL (SYRINGE) ×3 IMPLANT
SYR CONTROL 10ML LL (SYRINGE) ×1 IMPLANT
TOWEL GREEN STERILE FF (TOWEL DISPOSABLE) ×6 IMPLANT
TUBE CONNECTING 20X1/4 (TUBING) ×3 IMPLANT
UNDERPAD 30X36 HEAVY ABSORB (UNDERPADS AND DIAPERS) ×6 IMPLANT
YANKAUER SUCT BULB TIP NO VENT (SUCTIONS) ×3 IMPLANT

## 2022-04-30 NOTE — Transfer of Care (Signed)
Immediate Anesthesia Transfer of Care Note  Patient: Kimberly Stuart  Procedure(s) Performed: MAMMARY REDUCTION  (BREAST) (Bilateral: Breast)  Patient Location: PACU  Anesthesia Type:General  Level of Consciousness: sedated  Airway & Oxygen Therapy: Patient Spontanous Breathing  Post-op Assessment: Report given to RN and Post -op Vital signs reviewed and stable  Post vital signs: Reviewed and stable  Last Vitals:  Vitals Value Taken Time  BP    Temp    Pulse 94 04/30/22 1108  Resp    SpO2 95 % 04/30/22 1108  Vitals shown include unvalidated device data.  Last Pain:  Vitals:   04/30/22 0635  TempSrc: Oral  PainSc: 0-No pain         Complications: No notable events documented.

## 2022-04-30 NOTE — Interval H&P Note (Signed)
History and Physical Interval Note:  04/30/2022 6:54 AM  Kimberly Stuart  has presented today for surgery, with the diagnosis of macromastia, chronic neck and back pain, shoulder pain, intertrigo.  The various methods of treatment have been discussed with the patient and family. After consideration of risks, benefits and other options for treatment, the patient has consented to  Procedure(s): MAMMARY REDUCTION  (BREAST) (Bilateral) as a surgical intervention.  The patient's history has been reviewed, patient examined, no change in status, stable for surgery.  I have reviewed the patient's chart and labs.  Questions were answered to the patient's satisfaction.     Irean Hong Elius Etheredge

## 2022-04-30 NOTE — Op Note (Signed)
Operative Note   DATE OF OPERATION: 6.30.23  LOCATION: [facility]-outpatient  SURGICAL DIVISION: Plastic Surgery  PREOPERATIVE DIAGNOSES:  1. Macromastia 2. Chronic neck and back pain 3. Intertigo  POSTOPERATIVE DIAGNOSES:  same  PROCEDURE:  Bilateral breast reduction  SURGEON: Glenna Fellows MD MBA  ASSISTANT: none  ANESTHESIA:  General.   EBL: 40 ml  COMPLICATIONS: None immediate.   INDICATIONS FOR PROCEDURE:  The patient, Kimberly Stuart, is a 51 y.o. female born on 01-Mar-1971, is here for treatment chronic neck and back pain intertrigo in setting of macromastia that has failed conservative measures.    FINDINGS: Right reduction 891 g Left reduction 1238 g  DESCRIPTION OF PROCEDURE:  The patient was marked standing in the preoperative area to mark sternal notch, chest midline, anterior axillary lines, inframammary folds. The location of new nipple areolar complex was marked at level of on inframammary fold on anterior surface breast by palpation. This was marked symmetric over bilateral breasts. With aid of Wise pattern marker, location of new nipple areolar complex and vertical limbs (8 cm) were marked by displacement of breasts along meridian. The patient was taken to the operating room. SCDs were placed and IV antibiotics were given. The patient's operative site was prepped and draped in a sterile fashion. A time out was performed and all information was confirmed to be correct.     Over left breast, superior medial pedicle marked and nipple areolar complex incised with 45 mm diameter marker. Pedicle deepithlialized and developed to chest wall. Breast tissue resected over lower pole. Medial and lateral flaps developed. Additional lateral and superior breast tissue excised. Breast tailor tacked closed.    I then directed attention to right breast where superior medial pedicle designed. NAC marked with 45 mm diameter marker. The pedicle was deepithelialized. Pedicle  developed to chest wall. Breast tissue resected over lower pole. Medial and lateral flaps developed. Additional lateral and superior pole breast tissue excised. Breast tailor tacked closed, and patient assessed for symmetry. Breast cavities irrigated and hemostasis obtained. Local anesthetic infiltrated throughout each breast. 15 Fr JP placed in each breast and secured with 2-0 nylon. Closure completed bilateral with 3-0 vicryl to approximate dermis along inframammary fold and vertical limb. NAC inset with 4-0 vicryl in dermis. Skin closure completed with 4-0 monocryl subcuticular throughout. Tissue adhesive applied. Dry dressing and breast binder applied.  The patient was allowed to wake from anesthesia, extubated and taken to the recovery room in satisfactory condition.   SPECIMENS: right and left breast reduction  DRAINS: 15 Fr JP in right and left breasts  Glenna Fellows, MD [facility] Plastic & Reconstructive Surgery  Office/ physician access line after hours [phone]

## 2022-04-30 NOTE — Anesthesia Postprocedure Evaluation (Signed)
Anesthesia Post Note  Patient: Kimberly Stuart  Procedure(s) Performed: MAMMARY REDUCTION  (BREAST) (Bilateral: Breast)     Patient location during evaluation: PACU Anesthesia Type: General Level of consciousness: awake and alert Pain management: pain level controlled Vital Signs Assessment: post-procedure vital signs reviewed and stable Respiratory status: spontaneous breathing, nonlabored ventilation, respiratory function stable and patient connected to nasal cannula oxygen Cardiovascular status: blood pressure returned to baseline and stable Postop Assessment: no apparent nausea or vomiting Anesthetic complications: no   No notable events documented.  Last Vitals:  Vitals:   04/30/22 1230 04/30/22 1245  BP: 117/79 110/89  Pulse:    Resp:    Temp:    SpO2: 98% 98%    Last Pain:  Vitals:   04/30/22 1245  TempSrc:   PainSc: 2                  Nicholl Onstott S

## 2022-04-30 NOTE — Anesthesia Preprocedure Evaluation (Signed)
Anesthesia Evaluation  Patient identified by MRN, date of birth, ID band Patient awake    Reviewed: Allergy & Precautions, H&P , NPO status , Patient's Chart, lab work & pertinent test results  Airway Mallampati: II   Neck ROM: full    Dental   Pulmonary neg pulmonary ROS,    breath sounds clear to auscultation       Cardiovascular negative cardio ROS   Rhythm:regular Rate:Normal     Neuro/Psych    GI/Hepatic   Endo/Other    Renal/GU      Musculoskeletal   Abdominal   Peds  Hematology   Anesthesia Other Findings   Reproductive/Obstetrics                             Anesthesia Physical Anesthesia Plan  ASA: 1  Anesthesia Plan: General   Post-op Pain Management:    Induction: Intravenous  PONV Risk Score and Plan: 3 and Ondansetron, Dexamethasone, Midazolam and Treatment may vary due to age or medical condition  Airway Management Planned: Oral ETT  Additional Equipment:   Intra-op Plan:   Post-operative Plan: Extubation in OR  Informed Consent: I have reviewed the patients History and Physical, chart, labs and discussed the procedure including the risks, benefits and alternatives for the proposed anesthesia with the patient or authorized representative who has indicated his/her understanding and acceptance.     Dental advisory given  Plan Discussed with: CRNA, Anesthesiologist and Surgeon  Anesthesia Plan Comments:         Anesthesia Quick Evaluation  

## 2022-04-30 NOTE — Discharge Instructions (Signed)
No tylenol until 1230 No ibuprofen until 1230 Post Anesthesia Home Care Instructions  Activity: Get plenty of rest for the remainder of the day. A responsible individual must stay with you for 24 hours following the procedure.  For the next 24 hours, DO NOT: -Drive a car -Advertising copywriter -Drink alcoholic beverages -Take any medication unless instructed by your physician -Make any legal decisions or sign important papers.  Meals: Start with liquid foods such as gelatin or soup. Progress to regular foods as tolerated. Avoid greasy, spicy, heavy foods. If nausea and/or vomiting occur, drink only clear liquids until the nausea and/or vomiting subsides. Call your physician if vomiting continues.  Special Instructions/Symptoms: Your throat may feel dry or sore from the anesthesia or the breathing tube placed in your throat during surgery. If this causes discomfort, gargle with warm salt water. The discomfort should disappear within 24 hours.  If you had a scopolamine patch placed behind your ear for the management of post- operative nausea and/or vomiting:  1. The medication in the patch is effective for 72 hours, after which it should be removed.  Wrap patch in a tissue and discard in the trash. Wash hands thoroughly with soap and water. 2. You may remove the patch earlier than 72 hours if you experience unpleasant side effects which may include dry mouth, dizziness or visual disturbances. 3. Avoid touching the patch. Wash your hands with soap and water after contact with the patch.        JP Drain [employer] this sheet to all of your post-operative appointments while you have your drains. Please measure your drains by CC's or ML's. Make sure you drain and measure your JP Drains 3 times per day. At the end of each day, add up totals for the left side and add up totals for the right side.    ( 9 am )     ( 3 pm )        ( 9 pm )                Date L  R  L  R  L  R  Total L/R                                                                                                                                                                                       About my Jackson-Pratt Bulb Drain  What is a Jackson-Pratt bulb? A Jackson-Pratt is a soft, round device used to collect drainage. It is connected to a long, thin drainage catheter, which is held in place by one or two small stiches near your surgical incision  site. When the bulb is squeezed, it forms a vacuum, forcing the drainage to empty into the bulb.  Emptying the Jackson-Pratt bulb- To empty the bulb: 1. Release the plug on the top of the bulb. 2. Pour the bulb's contents into a measuring container which your nurse will provide. 3. Record the time emptied and amount of drainage. Empty the drain(s) as often as your     doctor or nurse recommends.  Date                  Time                    Amount (Drain 1)                 Amount (Drain 2)  _____________________________________________________________________  _____________________________________________________________________  _____________________________________________________________________  _____________________________________________________________________  _____________________________________________________________________  _____________________________________________________________________  _____________________________________________________________________  _____________________________________________________________________  Squeezing the Jackson-Pratt Bulb- To squeeze the bulb: 1. Make sure the plug at the top of the bulb is open. 2. Squeeze the bulb tightly in your fist. You will hear air squeezing from the bulb. 3. Replace the plug while the bulb is squeezed. 4. Use a safety pin to attach the bulb to your clothing. This will keep the catheter from     pulling at the bulb insertion site.  When to call your doctor- Call  your doctor if: Drain site becomes red, swollen or hot. You have a fever greater than 101 degrees F. There is oozing at the drain site. Drain falls out (apply a guaze bandage over the drain hole and secure it with tape). Drainage increases daily not related to activity patterns. (You will usually have more drainage when you are active than when you are resting.) Drainage has a bad odor.  ).

## 2022-04-30 NOTE — Anesthesia Procedure Notes (Signed)
Procedure Name: Intubation Date/Time: 04/30/2022 7:30 AM  Performed by: Willa Frater, CRNAPre-anesthesia Checklist: Patient identified, Emergency Drugs available, Suction available and Patient being monitored Patient Re-evaluated:Patient Re-evaluated prior to induction Oxygen Delivery Method: Circle system utilized Preoxygenation: Pre-oxygenation with 100% oxygen Induction Type: IV induction Ventilation: Mask ventilation without difficulty Laryngoscope Size: Mac and 3 Grade View: Grade I Tube type: Oral Tube size: 7.0 mm Number of attempts: 1 Airway Equipment and Method: Stylet and Oral airway Placement Confirmation: ETT inserted through vocal cords under direct vision, positive ETCO2 and breath sounds checked- equal and bilateral Secured at: 22 cm Tube secured with: Tape Dental Injury: Teeth and Oropharynx as per pre-operative assessment

## 2022-05-03 LAB — SURGICAL PATHOLOGY

## 2022-05-10 ENCOUNTER — Encounter (HOSPITAL_BASED_OUTPATIENT_CLINIC_OR_DEPARTMENT_OTHER): Payer: Self-pay | Admitting: Plastic Surgery

## 2022-10-15 ENCOUNTER — Other Ambulatory Visit: Payer: Self-pay

## 2022-10-15 ENCOUNTER — Encounter (HOSPITAL_BASED_OUTPATIENT_CLINIC_OR_DEPARTMENT_OTHER): Payer: Self-pay | Admitting: Plastic Surgery

## 2022-10-18 NOTE — H&P (Signed)
Subjective:     Patient ID: Kimberly Stuart is a 51 y.o. female.   Follow-up       5.5 months post op bilateral breast reduction. Developed open wound right breast wound. This has not responded to local wound care and schedule for excision and closure wound this week. Last seen 11.1.23, biopsy at that time showed spongiotic dermatitis without significant neutrophilic inflammation. Current wound care dry dressing alone. Reports significant pain over right breast not limited to wound. This is constant throbbing with intermittent sharp pains. Also notes firmness over right side and has been reading online anything larger than pea size should be excised. Expresses frustration with pain and that she has always hated her breasts, breast size and "wants them gond."   Prior GGG. Right reduction 891 g Left reduction 1238 g. Wt down 14 lb over last 6 mo.    MMG 12/2021 benign. MA with history breast ca at 50.   PMH significant for DVT 2019. Patient had DUB at that time, started on OCP to control and felt to be cause. Underwent Novasure at that time with resolution DUB. However course complicated by pelvic abscess and underwent IR drainage. Completed 1 year anticoagulation course. Patient on ASA 81mg .   Works as . Also in school [employer] for her bachelor's in history. Lives with two adult sons.    Review of Systems    Objective:   Physical Exam Constitutional:      Comments: tearful  Cardiovascular:     Rate and Rhythm: Normal rate and regular rhythm.     Heart sounds: Normal heart sounds.  Pulmonary:     Effort: Pulmonary effort is normal.     Breath sounds: Normal breath sounds.     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   Wound smaller than last exam but persists. Plan excision and closure wound. Reviewed OP surgery, GA, no drains. This will not change the asymmetries post reduction noted above. Reviewed will start process scar maturation over, reviewed post op limitations. Counseled no guarantee that she will not incur similar wound healing problems. This will not change the NAC diameter asymmetry.    Reviewed purpose of planned procedure is closure of wound. Her concerns are multifactorial and include fat necrosis or firmness of breasts and chronic pain. Procedure will not improve fat necrosis. Discussed complete excision wound, need to re elevate flaps in limited manner to allow closure. Reviewed any re intervention could create additional fat necrosis. It is my opinion that fat necrosis will soften over time as body remodels. Her description of her pain sounds nerve regrowth related. Discussed options massage breasts (though this may be limited due to her tenderness), trial Neurontin or Lyrica, referral to pain specialist. Patient states she does not want to be on pills for rest of life and declines trial Neurontin or Lyrica. With regards to fat necrosis again in absence infection do not recommend excision of firm areas of right breast. Again doing this does not guarantee pain improvement, and can lead to additional fat necrosis. Counseled she does not have to  have surgery for wound this week and could defer until she has obtained a second opinion. Patient plans to proceed with excision and closure right breast wound this week.    Patient states she has pain medication at home, declines Rx.

## 2022-10-22 ENCOUNTER — Ambulatory Visit (HOSPITAL_BASED_OUTPATIENT_CLINIC_OR_DEPARTMENT_OTHER): Admission: RE | Admit: 2022-10-22 | Payer: BC Managed Care – PPO | Source: Ambulatory Visit | Admitting: Plastic Surgery

## 2022-10-22 DIAGNOSIS — Z01818 Encounter for other preprocedural examination: Secondary | ICD-10-CM

## 2022-10-22 SURGERY — LESION EXCISION WITH COMPLEX REPAIR
Anesthesia: General | Site: Breast | Laterality: Right

## 2023-01-13 ENCOUNTER — Emergency Department (HOSPITAL_BASED_OUTPATIENT_CLINIC_OR_DEPARTMENT_OTHER)
Admission: EM | Admit: 2023-01-13 | Discharge: 2023-01-13 | Disposition: A | Discharge status: Home / Self Care | Payer: BC Managed Care – PPO | Attending: Emergency Medicine | Admitting: Emergency Medicine

## 2023-01-13 ENCOUNTER — Encounter (HOSPITAL_BASED_OUTPATIENT_CLINIC_OR_DEPARTMENT_OTHER): Payer: Self-pay | Admitting: Emergency Medicine

## 2023-01-13 ENCOUNTER — Other Ambulatory Visit: Payer: Self-pay

## 2023-01-13 ENCOUNTER — Emergency Department (HOSPITAL_BASED_OUTPATIENT_CLINIC_OR_DEPARTMENT_OTHER): Payer: BC Managed Care – PPO

## 2023-01-13 ENCOUNTER — Other Ambulatory Visit (HOSPITAL_BASED_OUTPATIENT_CLINIC_OR_DEPARTMENT_OTHER): Payer: Self-pay

## 2023-01-13 DIAGNOSIS — Z7982 Long term (current) use of aspirin: Secondary | ICD-10-CM | POA: Insufficient documentation

## 2023-01-13 DIAGNOSIS — Z91040 Latex allergy status: Secondary | ICD-10-CM | POA: Insufficient documentation

## 2023-01-13 DIAGNOSIS — R1030 Lower abdominal pain, unspecified: Secondary | ICD-10-CM | POA: Diagnosis present

## 2023-01-13 DIAGNOSIS — K5732 Diverticulitis of large intestine without perforation or abscess without bleeding: Secondary | ICD-10-CM | POA: Insufficient documentation

## 2023-01-13 LAB — COMPREHENSIVE METABOLIC PANEL
ALT: 12 U/L (ref 0–44)
AST: 15 U/L (ref 15–41)
Albumin: 4.6 g/dL (ref 3.5–5.0)
Alkaline Phosphatase: 57 U/L (ref 38–126)
Anion gap: 10 (ref 5–15)
BUN: 14 mg/dL (ref 6–20)
CO2: 24 mmol/L (ref 22–32)
Calcium: 9.8 mg/dL (ref 8.9–10.3)
Chloride: 104 mmol/L (ref 98–111)
Creatinine, Ser: 0.97 mg/dL (ref 0.44–1.00)
GFR, Estimated: >60 mL/min (ref >60)
Glucose, Bld: 103 mg/dL — ABNORMAL HIGH (ref 70–99)
Potassium: 3.8 mmol/L (ref 3.5–5.1)
Sodium: 138 mmol/L (ref 135–145)
Total Bilirubin: 0.7 mg/dL (ref 0.3–1.2)
Total Protein: 7.6 g/dL (ref 6.5–8.1)

## 2023-01-13 LAB — URINALYSIS, ROUTINE W REFLEX MICROSCOPIC
Bacteria, UA: NONE SEEN
Bilirubin Urine: NEGATIVE
Glucose, UA: NEGATIVE mg/dL
Hgb urine dipstick: NEGATIVE
Ketones, ur: NEGATIVE mg/dL
Nitrite: NEGATIVE
Specific Gravity, Urine: 1.028 (ref 1.005–1.030)
pH: 6 (ref 5.0–8.0)

## 2023-01-13 LAB — CBC
HCT: 37.4 % (ref 36.0–46.0)
Hemoglobin: 13.5 g/dL (ref 12.0–15.0)
MCH: 31.1 pg (ref 26.0–34.0)
MCHC: 36.1 g/dL — ABNORMAL HIGH (ref 30.0–36.0)
MCV: 86.2 fL (ref 80.0–100.0)
Platelets: 263 10*3/uL (ref 150–400)
RBC: 4.34 MIL/uL (ref 3.87–5.11)
RDW: 12.2 % (ref 11.5–15.5)
WBC: 6 10*3/uL (ref 4.0–10.5)
nRBC: 0 % (ref 0.0–0.2)

## 2023-01-13 LAB — LIPASE, BLOOD: Lipase: 24 U/L (ref 11–51)

## 2023-01-13 LAB — HCG, SERUM, QUALITATIVE: Preg, Serum: NEGATIVE

## 2023-01-13 MED ORDER — MORPHINE SULFATE (PF) 4 MG/ML IV SOLN
4.0000 mg | Freq: Once | INTRAVENOUS | Status: AC
  Administered 2023-01-13: 4 mg via INTRAVENOUS
  Filled 2023-01-13: qty 1

## 2023-01-13 MED ORDER — AMOXICILLIN-POT CLAVULANATE 875-125 MG PO TABS: 1.0000 | ORAL_TABLET | Freq: Two times a day (BID) | ORAL | 0 refills | Status: AC

## 2023-01-13 MED ORDER — IOHEXOL 350 MG/ML SOLN
100.0000 mL | Freq: Once | INTRAVENOUS | Status: AC | PRN
  Administered 2023-01-13: 75 mL via INTRAVENOUS

## 2023-01-13 NOTE — Discharge Instructions (Addendum)
You were seen for diverticulitis in the emergency department.   At home, please take tylenol and ibuprofen for pain. Take the Augmentin for your infection.   Check your MyChart online for the results of any tests that had not resulted by the time you left the emergency department.   Follow-up with your primary doctor in 2-3 days regarding your visit.  Follow-up with a gastroenterologist about a possible colonoscopy.  Return immediately to the emergency department if you experience any of the following: worsening pain, high fever, or any other concerning symptoms.    Thank you for visiting our Emergency Department. It was a pleasure taking care of you today.

## 2023-01-13 NOTE — ED Notes (Signed)
Patient verbalizes understanding of discharge instructions. Opportunity for questioning and answers were provided. Patient discharged from ED.  °

## 2023-01-13 NOTE — ED Provider Notes (Signed)
[facility] Provider Note   CSN: [id number] Arrival date & time: 01/13/23  1411     History  Chief Complaint  Patient presents with   Pelvic Pain    Kimberly Stuart is a 52 y.o. female.  52 year old female with a history of pelvic abscess who presents emergency department with lower abdominal pain.  Reports that since Saturday she has been having soreness in her lower abdomen.  Says that recently started becoming sharp.  Was initially intermittent and is becoming more constant.  Not worsened by having bowel movements.  No vomiting or diarrhea.  No dysuria or frequency.  Says that she has not had any abdominal surgeries aside from the abscess that was drained.  No history of diverticulitis.  No fevers at home.  No vaginal discharge or bleeding.  Has not been sexually active in years.      Home Medications Prior to Admission medications   Medication Sig Start Date End Date Taking? Authorizing Provider  amoxicillin-clavulanate (AUGMENTIN) 875-125 MG tablet Take 1 tablet by mouth every 12 (twelve) hours for 7 days. 01/13/23 01/20/23 Yes Fransico Meadow, MD  aspirin EC 81 MG tablet Take 81 mg by mouth daily.    [provider]  ibuprofen (ADVIL) 200 MG tablet Take 400 mg by mouth daily as needed for fever, headache or moderate pain.    [provider]  phentermine (ADIPEX-P) 37.5 MG tablet Take 37.5 mg by mouth daily before breakfast.    [provider]      Allergies    Estrogens, Latex, and Wound dressing adhesive    Review of Systems   Review of Systems  Physical Exam Updated Vital Signs BP 137/82 (BP Location: Right Arm)   Pulse 86   Temp 98.1 F (36.7 C) (Temporal)   Resp 16   Ht '5\' 2"'$  (1.575 m)   Wt 78 kg   SpO2 97%   BMI 31.46 kg/m  Physical Exam Vitals and nursing note reviewed.  Constitutional:      General: She is not in acute distress.    Appearance: She is well-developed.  HENT:      Head: Normocephalic and atraumatic.     Right Ear: External ear normal.     Left Ear: External ear normal.     Nose: Nose normal.  Eyes:     Extraocular Movements: Extraocular movements intact.     Conjunctiva/sclera: Conjunctivae normal.     Pupils: Pupils are equal, round, and reactive to light.  Cardiovascular:     Rate and Rhythm: Normal rate and regular rhythm.  Pulmonary:     Effort: Pulmonary effort is normal. No respiratory distress.  Abdominal:     General: Abdomen is flat. There is no distension.     Palpations: Abdomen is soft. There is no mass.     Tenderness: There is abdominal tenderness (Suprapubic and right lower quadrant). There is no guarding.  Musculoskeletal:     Cervical back: Normal range of motion and neck supple.     Right lower leg: No edema.     Left lower leg: No edema.  Skin:    General: Skin is warm and dry.  Neurological:     Mental Status: She is alert and oriented to person, place, and time. Mental status is at baseline.  Psychiatric:        Mood and Affect: Mood normal.     ED Results / Procedures / Treatments   Labs (  all labs ordered are listed, but only abnormal results are displayed) Labs Reviewed  COMPREHENSIVE METABOLIC PANEL - Abnormal; Notable for the following components:      Result Value   Glucose, Bld 103 (*)    All other components within normal limits  CBC - Abnormal; Notable for the following components:   MCHC 36.1 (*)    All other components within normal limits  URINALYSIS, ROUTINE W REFLEX MICROSCOPIC - Abnormal; Notable for the following components:   Protein, ur TRACE (*)    Leukocytes,Ua MODERATE (*)    All other components within normal limits  LIPASE, BLOOD  HCG, SERUM, QUALITATIVE    EKG None  Radiology CT ABDOMEN PELVIS W CONTRAST  Result Date: 01/13/2023 CLINICAL DATA:  Pain lower abdomen and right lower quadrant EXAM: CT ABDOMEN AND PELVIS WITH CONTRAST TECHNIQUE: Multidetector CT imaging of the abdomen  and pelvis was performed using the standard protocol following bolus administration of intravenous contrast. RADIATION DOSE REDUCTION: This exam was performed according to the departmental dose-optimization program which includes automated exposure control, adjustment of the mA and/or kV according to patient size and/or use of iterative reconstruction technique. CONTRAST:  1m OMNIPAQUE IOHEXOL 350 MG/ML SOLN COMPARISON:  08/08/2018 FINDINGS: Lower chest: No acute findings are seen. Hepatobiliary: There is 2.2 cm low-density structure with peripheral nodular enhancement in the posterior dome of right lobe of liver with no interval change suggesting hemangioma. There is no dilation of bile ducts. Gallbladder is unremarkable. Pancreas: No focal abnormalities are seen. Spleen: Unremarkable. Adrenals/Urinary Tract: Adrenals are unremarkable. There is no hydronephrosis. There are no renal or ureteral stones. Urinary bladder is not distended. Stomach/Bowel: Stomach is unremarkable. Small bowel loops are not dilated. Appendix is not dilated. Scattered diverticula are seen in colon. There is wall thickening and pericolic stranding in sigmoid colon in right side of pelvis. There is no loculated pericolic fluid collection. Trace amount of free fluid is seen in right pelvic cavity. Vascular/Lymphatic: Vascular structures are unremarkable. There are subcentimeter nodes in retroperitoneum suggesting possible benign reactive hyperplasia. Reproductive: There is mild lobulation in the margin of the uterus suggesting possible small fibroids. There are no adnexal masses. There is ectasia of vascular structures in the left adnexa, possibly suggesting pelvic venous congestion. Other: There is no pneumoperitoneum. Small umbilical hernia containing fat is seen. Musculoskeletal: Last lumbar vertebra is transitional. IMPRESSION: Diverticulosis of colon. There is wall thickening and pericolic stranding in and adjacent to sigmoid colon in  right side of pelvis consistent with acute sigmoid diverticulitis. There is no demonstrable pericolic abscess. Trace amount of free fluid in right side of pelvis may be related to acute sigmoid diverticulitis. There is no evidence of intestinal obstruction or pneumoperitoneum. There is no hydronephrosis. Other findings as described in the body of the report. Electronically Signed   By: PElmer PickerM.D.   On: 01/13/2023 16:15    Procedures Procedures   Medications Ordered in ED Medications  morphine (PF) 4 MG/ML injection 4 mg (4 mg Intravenous Given 01/13/23 1535)  iohexol (OMNIPAQUE) 350 MG/ML injection 100 mL (75 mLs Intravenous Contrast Given 01/13/23 1550)    ED Course/ Medical Decision Making/ A&P                             Medical Decision Making Amount and/or Complexity of Data Reviewed Labs: ordered. Radiology: ordered.  Risk Prescription drug management.   Kimberly DIFFEYis a 52y.o. female with  comorbidities that complicate the patient evaluation including pelvic abscess who presents to the emergency department with lower abdominal pain  Initial Ddx:  Recurrent pelvic abscess, diverticulitis, appendicitis, UTI, PID  MDM:  Concerned about recurrent pelvic abscess or diverticulitis or appendicitis given the patient's symptoms.  Not having any other symptoms concerning for UTI.  Considered PID but has not been sexually active in years so feel that this is less likely.  Plan:  Labs Urinalysis CT abdomen pelvis IV contrast  ED Summary/Re-evaluation:  Patient reassessed and was stable.  CT scan showed diverticulitis.  Patient was given prescription of Augmentin and instructed to follow-up with her primary doctor in several days regarding her symptoms and GI regarding need for possible colonoscopy.  This patient presents to the ED for concern of complaints listed in HPI, this involves an extensive number of treatment options, and is a complaint that carries with  it a high risk of complications and morbidity. Disposition including potential need for admission considered.   Dispo: DC Home. Return precautions discussed including, but not limited to, those listed in the AVS. Allowed pt time to ask questions which were answered fully prior to dc.  Additional history obtained from family Records reviewed Outpatient Clinic Notes The following labs were independently interpreted: Urinalysis and show no acute abnormality I independently reviewed the following imaging with scope of interpretation limited to determining acute life threatening conditions related to emergency care: CT Abdomen/Pelvis and agree with the radiologist interpretation with the following exceptions: None I personally reviewed and interpreted cardiac monitoring: normal sinus rhythm  I personally reviewed and interpreted the pt's EKG: see above for interpretation  I have reviewed the patients home medications and made adjustments as needed  Final Clinical Impression(s) / ED Diagnoses Final diagnoses:  Diverticulitis of sigmoid colon    Rx / DC Orders ED Discharge Orders          Ordered    amoxicillin-clavulanate (AUGMENTIN) 875-125 MG tablet  Every 12 hours        01/13/23 1650              Fransico Meadow, MD 01/13/23 1659

## 2023-01-13 NOTE — ED Triage Notes (Signed)
Pt arrives to ED with c/o bilateral pelvic pain that started on 3/9. Today the pain has worsened today and tends to worsen when pt eat or drinks. She notes pain on right pelvis worse than left.

## 2023-04-14 LAB — COLOGUARD: COLOGUARD: NEGATIVE

## 2024-10-19 ENCOUNTER — Encounter (HOSPITAL_COMMUNITY): Payer: Self-pay

## 2024-10-19 ENCOUNTER — Emergency Department (HOSPITAL_COMMUNITY)
Admission: EM | Admit: 2024-10-19 | Discharge: 2024-10-19 | Disposition: A | Discharge status: Home / Self Care | Attending: Emergency Medicine | Admitting: Emergency Medicine

## 2024-10-19 ENCOUNTER — Other Ambulatory Visit: Payer: Self-pay

## 2024-10-19 ENCOUNTER — Emergency Department (HOSPITAL_COMMUNITY)

## 2024-10-19 DIAGNOSIS — N390 Urinary tract infection, site not specified: Secondary | ICD-10-CM | POA: Diagnosis not present

## 2024-10-19 DIAGNOSIS — R109 Unspecified abdominal pain: Secondary | ICD-10-CM | POA: Diagnosis present

## 2024-10-19 DIAGNOSIS — Z91040 Latex allergy status: Secondary | ICD-10-CM | POA: Diagnosis not present

## 2024-10-19 DIAGNOSIS — M25511 Pain in right shoulder: Secondary | ICD-10-CM | POA: Insufficient documentation

## 2024-10-19 DIAGNOSIS — R197 Diarrhea, unspecified: Secondary | ICD-10-CM | POA: Diagnosis not present

## 2024-10-19 DIAGNOSIS — Z7982 Long term (current) use of aspirin: Secondary | ICD-10-CM | POA: Insufficient documentation

## 2024-10-19 LAB — CBC
HCT: 43.2 % (ref 36.0–46.0)
Hemoglobin: 14.9 g/dL (ref 12.0–15.0)
MCH: 30.8 pg (ref 26.0–34.0)
MCHC: 34.5 g/dL (ref 30.0–36.0)
MCV: 89.4 fL (ref 80.0–100.0)
Platelets: 240 K/uL (ref 150–400)
RBC: 4.83 MIL/uL (ref 3.87–5.11)
RDW: 12.7 % (ref 11.5–15.5)
WBC: 7.5 K/uL (ref 4.0–10.5)
nRBC: 0 % (ref 0.0–0.2)

## 2024-10-19 LAB — URINALYSIS, ROUTINE W REFLEX MICROSCOPIC
Bacteria, UA: NONE SEEN
Bilirubin Urine: NEGATIVE
Glucose, UA: NEGATIVE mg/dL
Hgb urine dipstick: NEGATIVE
Ketones, ur: 20 mg/dL — AB
Nitrite: NEGATIVE
Protein, ur: NEGATIVE mg/dL
Specific Gravity, Urine: 1.018 (ref 1.005–1.030)
pH: 5 (ref 5.0–8.0)

## 2024-10-19 LAB — COMPREHENSIVE METABOLIC PANEL WITH GFR
ALT: 29 U/L (ref 0–44)
AST: 30 U/L (ref 15–41)
Albumin: 4.9 g/dL (ref 3.5–5.0)
Alkaline Phosphatase: 82 U/L (ref 38–126)
Anion gap: 13 (ref 5–15)
BUN: 15 mg/dL (ref 6–20)
CO2: 24 mmol/L (ref 22–32)
Calcium: 10 mg/dL (ref 8.9–10.3)
Chloride: 102 mmol/L (ref 98–111)
Creatinine, Ser: 0.86 mg/dL (ref 0.44–1.00)
GFR, Estimated: >60 mL/min
Glucose, Bld: 87 mg/dL (ref 70–99)
Potassium: 4.1 mmol/L (ref 3.5–5.1)
Sodium: 139 mmol/L (ref 135–145)
Total Bilirubin: 0.7 mg/dL (ref 0.0–1.2)
Total Protein: 7.9 g/dL (ref 6.5–8.1)

## 2024-10-19 LAB — C DIFFICILE QUICK SCREEN W PCR REFLEX
C Diff antigen: NEGATIVE
C Diff interpretation: NOT DETECTED
C Diff toxin: NEGATIVE

## 2024-10-19 LAB — TROPONIN T, HIGH SENSITIVITY: Troponin T High Sensitivity: <15 ng/L (ref 0–19)

## 2024-10-19 LAB — LIPASE, BLOOD: Lipase: 42 U/L (ref 11–51)

## 2024-10-19 MED ORDER — IOHEXOL 300 MG/ML  SOLN
100.0000 mL | Freq: Once | INTRAMUSCULAR | Status: AC | PRN
  Administered 2024-10-19: 100 mL via INTRAVENOUS

## 2024-10-19 MED ORDER — ONDANSETRON HCL 4 MG/2ML IJ SOLN
4.0000 mg | Freq: Once | INTRAMUSCULAR | Status: AC
  Administered 2024-10-19: 4 mg via INTRAVENOUS
  Filled 2024-10-19: qty 2

## 2024-10-19 MED ORDER — IOHEXOL 300 MG/ML  SOLN: 100.0000 mL | Freq: Once | INTRAMUSCULAR | Status: DC | PRN

## 2024-10-19 MED ORDER — DICYCLOMINE HCL 10 MG PO CAPS
10.0000 mg | ORAL_CAPSULE | Freq: Once | ORAL | Status: AC
  Administered 2024-10-19: 10 mg via ORAL
  Filled 2024-10-19: qty 1

## 2024-10-19 MED ORDER — DICYCLOMINE HCL 20 MG PO TABS: 20.0000 mg | ORAL_TABLET | Freq: Two times a day (BID) | ORAL | 0 refills | Status: AC

## 2024-10-19 MED ORDER — MORPHINE SULFATE (PF) 4 MG/ML IV SOLN
4.0000 mg | Freq: Once | INTRAVENOUS | Status: AC
  Administered 2024-10-19: 4 mg via INTRAVENOUS
  Filled 2024-10-19: qty 1

## 2024-10-19 MED ORDER — CEPHALEXIN 500 MG PO CAPS: 500.0000 mg | ORAL_CAPSULE | Freq: Two times a day (BID) | ORAL | 0 refills | Status: AC

## 2024-10-19 MED ORDER — CEPHALEXIN 500 MG PO CAPS
500.0000 mg | ORAL_CAPSULE | Freq: Once | ORAL | Status: AC
  Administered 2024-10-19: 500 mg via ORAL
  Filled 2024-10-19: qty 1

## 2024-10-19 NOTE — Discharge Instructions (Addendum)
 You were found to have a urinary tract infection.  Start Keflex, 1 tablet by mouth twice daily for 7 days, you received the first dose of this medication in the emergency department this evening.  Start Bentyl, take 1 tablet by mouth twice daily as needed for abdominal cramping.  The remainder of your stool studies are still pending, your results will be available on MyChart.  I have provided you the contact information for a gastroenterologist, please contact their office to schedule follow-up in regard to your symptoms today.  Follow-up with your primary care provider.  Return to the emergency department if your symptoms worsen.

## 2024-10-19 NOTE — ED Triage Notes (Signed)
 Patient has had gas in her stomach. She has watery orange diarrhea. No appetite. She said food runs right through her. Today has sharp right shoulder pain. Has generalized abdominal pain. No vomiting.

## 2024-10-19 NOTE — ED Provider Notes (Signed)
 " [place] [facility] AT [facility] Provider Note   CSN: [id number] Arrival date & time: 10/19/24  1614     Patient presents with: Abdominal Pain   Kimberly Stuart is a 53 y.o. female.   53 year old female presenting with abdominal pain and diarrhea for 4 days.  Patient notes 4 days of watery/orangeish/mucousy diarrhea, reports that she has diarrhea every time she eats anything and therefore she has not been able to eat much except for some mashed potatoes.  Although she has not been able to eat very much, she does mention that she is still staying well-hydrated and drinking plenty of water.  Endorses abdominal pain that ranges from a 6-8/10.  Denies vomiting but reports feelings of indigestion which she describes as belching.  Denies chest pain or shortness of breath but does report some right posterior shoulder pain that began today, no known injury/trigger/inciting event.  Denies fever, new medications/antibiotics, recent travel, known sick contacts.  Denies dysuria/hematuria, hematochezia/melena.    Abdominal Pain      Prior to Admission medications  Medication Sig Start Date End Date Taking? Authorizing Provider  aspirin EC 81 MG tablet Take 81 mg by mouth daily.    [provider]  ibuprofen  (ADVIL ) 200 MG tablet Take 400 mg by mouth daily as needed for fever, headache or moderate pain.    [provider]  phentermine (ADIPEX-P) 37.5 MG tablet Take 37.5 mg by mouth daily before breakfast.    [provider]    Allergies: Estrogens, Latex, and Wound dressing adhesive    Review of Systems  Gastrointestinal:  Positive for abdominal pain.    Updated Vital Signs  Vitals:   10/19/24 1622 10/19/24 1640  BP: (!) 139/91   Pulse: 78   Resp: 16   Temp: 98.1 F (36.7 C)   TempSrc: Oral   SpO2: 100%   Weight:  81.6 kg  Height:  5' 1 (1.549 m)     Physical Exam Vitals and nursing note reviewed.  Constitutional:       General: She is in acute distress.  HENT:     Head: Normocephalic.  Eyes:     Extraocular Movements: Extraocular movements intact.  Cardiovascular:     Rate and Rhythm: Normal rate and regular rhythm.     Heart sounds: Normal heart sounds.  Pulmonary:     Effort: Pulmonary effort is normal.     Breath sounds: Normal breath sounds.  Abdominal:     General: Bowel sounds are normal.     Palpations: Abdomen is soft.     Tenderness: There is abdominal tenderness (diffusely tender in lower quadrants). There is no guarding.  Musculoskeletal:     Cervical back: Normal range of motion.     Comments: Moves all extremities spontaneously without difficulty  Skin:    General: Skin is warm and dry.  Neurological:     Mental Status: She is alert and oriented to person, place, and time.     (all labs ordered are listed, but only abnormal results are displayed) Labs Reviewed  URINALYSIS, ROUTINE W REFLEX MICROSCOPIC - Abnormal; Notable for the following components:      Result Value   Ketones, ur 20 (*)    Leukocytes,Ua MODERATE (*)    All other components within normal limits  C DIFFICILE QUICK SCREEN W PCR REFLEX    GASTROINTESTINAL PANEL BY PCR, STOOL (REPLACES STOOL CULTURE)  URINE CULTURE  LIPASE, BLOOD  COMPREHENSIVE METABOLIC PANEL WITH  GFR  CBC  TROPONIN T, HIGH SENSITIVITY    EKG: EKG Interpretation Date/Time:  Friday October 19 2024 20:51:31 EST Ventricular Rate:  72 PR Interval:  170 QRS Duration:  80 QT Interval:  391 QTC Calculation: 428 R Axis:   61  Text Interpretation: Sinus rhythm RSR' in V1 or V2, probably normal variant Nonspecific T abnormalities, lateral leads No significant change since last tracing Confirmed by Ellouise Fine ([id number]) on 10/19/2024 9:35:14 PM  Radiology: CT ABDOMEN PELVIS W CONTRAST Result Date: 10/19/2024 CLINICAL DATA:  Abdominal pain and diarrhea for several days EXAM: CT ABDOMEN AND PELVIS WITH CONTRAST TECHNIQUE: Multidetector CT  imaging of the abdomen and pelvis was performed using the standard protocol following bolus administration of intravenous contrast. RADIATION DOSE REDUCTION: This exam was performed according to the departmental dose-optimization program which includes automated exposure control, adjustment of the mA and/or kV according to patient size and/or use of iterative reconstruction technique. CONTRAST:  OMNIPAQUE  IOHEXOL  300 MG/ML  SOLN COMPARISON:  01/13/2023 FINDINGS: Lower chest: Tiny nodule is noted in the lingula best seen on image number 4 of series 2. This is stable from the prior CT examination from 2024 and consistent with a benign etiology. Hepatobiliary: Liver demonstrates a hypodensity with nodular peripheral enhancement in the posterior aspect of the right lobe of the liver consistent with hemangioma. The gallbladder is within normal limits. Pancreas: Unremarkable. No pancreatic ductal dilatation or surrounding inflammatory changes. Spleen: Normal in size without focal abnormality. Adrenals/Urinary Tract: Adrenal glands are within normal limits. Kidneys demonstrate a normal enhancement pattern. Normal excretion is noted bilaterally. No calculi or obstructive changes are seen. The bladder is partially distended. Stomach/Bowel: Scattered diverticular change of the colon is noted. No findings of diverticulitis is seen. The colon is predominately decompressed. The appendix is within normal limits. Small bowel and stomach are unremarkable. Vascular/Lymphatic: No significant vascular findings are present. No enlarged abdominal or pelvic lymph nodes. Reproductive: Uterus and bilateral adnexa are unremarkable. Other: No abdominal wall hernia or abnormality. No abdominopelvic ascites. Musculoskeletal: No acute or significant osseous findings. IMPRESSION: Diverticulosis without diverticulitis. Hepatic hemangioma. Stable lingular nodular density.  No follow-up is recommended. Electronically Signed   By: Oneil Devonshire  M.D.   On: 10/19/2024 21:53   DG Chest 2 View Result Date: 10/19/2024 CLINICAL DATA:  Right-sided shoulder pain EXAM: CHEST - 2 VIEW COMPARISON:  07/31/2016 FINDINGS: The heart size and mediastinal contours are within normal limits. Both lungs are clear. The visualized skeletal structures are unremarkable. IMPRESSION: No active cardiopulmonary disease. Electronically Signed   By: Oneil Devonshire M.D.   On: 10/19/2024 20:44     Procedures   Medications Ordered in the ED  iohexol  (OMNIPAQUE ) 300 MG/ML solution 100 mL (has no administration in time range)  cephALEXin  (KEFLEX ) capsule 500 mg (has no administration in time range)  dicyclomine  (BENTYL ) capsule 10 mg (has no administration in time range)  morphine  (PF) 4 MG/ML injection 4 mg (4 mg Intravenous Given 10/19/24 2050)  ondansetron  (ZOFRAN ) injection 4 mg (4 mg Intravenous Given 10/19/24 2050)  iohexol  (OMNIPAQUE ) 300 MG/ML solution 100 mL (100 mLs Intravenous Contrast Given 10/19/24 2128)                                    Medical Decision Making This patient presents to the ED for concern of abdominal pain/diarrhea, this involves an extensive number of treatment options, and is a  complaint that carries with it a high risk of complications and morbidity.  The differential diagnosis includes gastroenteritis, infectious diarrhea, diverticulitis, diverticulosis, appendicitis   Co morbidities that complicate the patient evaluation  H/o DVT   Additional history obtained:  Additional history obtained from record review External records from outside source obtained and reviewed including prior PCP note   Lab Tests:  I Ordered, and personally interpreted labs.  The pertinent results include: CBC within normal limits.  CMP within normal limits.  Lipase within normal limits.  Urinalysis notable for moderate leukocytes with 20 ketones and white blood cells, will send for urine culture.  Troponin normal.  C. difficile negative.  Stool  PCR pending.   Imaging Studies ordered:  I ordered imaging studies including CT abdomen/pelvis, CXR I independently visualized and interpreted imaging which showed  - CT: Diverticulosis without diverticulitis. Hepatic hemangioma. Stable lingular nodular density.  No follow-up is recommended. - CXR: No active cardiopulmonary disease. I agree with the radiologist interpretation   Cardiac Monitoring: / EKG:  The patient was maintained on a cardiac monitor.  I personally viewed and interpreted the cardiac monitored which showed an underlying rhythm of: NSR    Problem List / ED Course / Critical interventions / Medication management  I ordered medication including morphine /Zofran  for pain and nausea, Keflex  for UTI and Bentyl  for abdominal cramping Reevaluation of the patient after these medicines showed that the patient improved I have reviewed the patients home medicines and have made adjustments as needed   Social Determinants of Health:  Occupational stress   Test / Admission - Considered:  Physical exam notable as above, patient with diffuse abdominal tenderness in the lower quadrants on exam. Patient describes 4 days of diarrhea with an orange/watery/mucous appearance, denies blood in stool. No fever at home however persistent nature of diarrhea without vomiting raises suspicion for underlying infectious etiology, will obtain stool studies.  Will also obtain ACS workup given sudden onset of right shoulder pain, suspicion that this could be a cardiac mimic.  At time of reassessment, patient's abdominal pain has improved however when she went to provide a stool sample she noted there was bright red blood in the sample, this is the first time that this has occurred.  C. difficile negative but additional stool PCR testing still pending.  Labs are otherwise reassuring with a normal hemoglobin/WBC, urinalysis does show moderate leukocytes with white blood cells raising suspicion for  urinary tract infection, although patient does not have dysuria.  First dose of Keflex  given in the emergency department this evening for UTI.  Patient able to tolerate p.o. without subsequent vomiting/diarrhea/abdominal pain. Will hold off on additional antibiotic administration as remainder of stool PCR is pending, as my suspicion for infectious diarrhea is relatively low.  C. difficile negative as above.  I discussed this plan in depth with the patient, I will also provide her with contact information for gastroenterologist to schedule follow-up given the persistent nature of her symptoms.  Patient has access to MyChart and understands she will be contacted in the event that any of her stool studies are positive resulting in need for treatment.  Will prescribe Bentyl  to be used as needed for abdominal cramping.  Return precautions discussed, patient voiced understanding is in agreement this plan, she is appropriate for discharge at this time.  Amount and/or Complexity of Data Reviewed Labs: ordered. Radiology: ordered.  Risk Prescription drug management.        Final diagnoses:  Abdominal pain, unspecified abdominal  location  Diarrhea, unspecified type  Urinary tract infection without hematuria, site unspecified    ED Discharge Orders          Ordered    cephALEXin  (KEFLEX ) 500 MG capsule  2 times daily        10/19/24 2321    dicyclomine  (BENTYL ) 20 MG tablet  2 times daily        10/19/24 2321               Glendia Rocky SAILOR, PA-C 10/19/24 2324    Kingsley, Victoria K, DO 10/26/24 1148  "

## 2024-10-19 NOTE — ED Notes (Signed)
 Pt educated on DC instructions and verbalizes understanding, pt with family member upon leaving department.

## 2024-10-20 LAB — GASTROINTESTINAL PANEL BY PCR, STOOL (REPLACES STOOL CULTURE)

## 2024-10-21 LAB — URINE CULTURE: Culture: <10000 — AB
